# Patient Record
Sex: Male | Born: 1980 | Race: White | Hispanic: No | Marital: Married | Smoking: Current some day smoker
Health system: Southern US, Community
[De-identification: ages and names within clinical notes are randomized; demographics above are authoritative.]

## PROBLEM LIST (undated history)

## (undated) DIAGNOSIS — M549 Dorsalgia, unspecified: Secondary | ICD-10-CM

## (undated) DIAGNOSIS — B029 Zoster without complications: Secondary | ICD-10-CM

## (undated) DIAGNOSIS — F419 Anxiety disorder, unspecified: Secondary | ICD-10-CM

## (undated) DIAGNOSIS — M1990 Unspecified osteoarthritis, unspecified site: Secondary | ICD-10-CM

## (undated) DIAGNOSIS — I739 Peripheral vascular disease, unspecified: Secondary | ICD-10-CM

## (undated) HISTORY — PX: WISDOM TOOTH EXTRACTION: SHX21

## (undated) HISTORY — PX: TOOTH EXTRACTION: SUR596

---

## 2012-11-10 ENCOUNTER — Emergency Department (HOSPITAL_BASED_OUTPATIENT_CLINIC_OR_DEPARTMENT_OTHER): Payer: Managed Care, Other (non HMO)

## 2012-11-10 ENCOUNTER — Emergency Department (HOSPITAL_BASED_OUTPATIENT_CLINIC_OR_DEPARTMENT_OTHER)
Admission: EM | Admit: 2012-11-10 | Discharge: 2012-11-10 | Disposition: A | Discharge status: Home / Self Care | Payer: Managed Care, Other (non HMO) | Attending: Emergency Medicine | Admitting: Emergency Medicine

## 2012-11-10 ENCOUNTER — Encounter (HOSPITAL_BASED_OUTPATIENT_CLINIC_OR_DEPARTMENT_OTHER): Payer: Self-pay | Admitting: *Deleted

## 2012-11-10 DIAGNOSIS — Z79899 Other long term (current) drug therapy: Secondary | ICD-10-CM | POA: Insufficient documentation

## 2012-11-10 DIAGNOSIS — F17200 Nicotine dependence, unspecified, uncomplicated: Secondary | ICD-10-CM | POA: Insufficient documentation

## 2012-11-10 DIAGNOSIS — M5126 Other intervertebral disc displacement, lumbar region: Secondary | ICD-10-CM | POA: Insufficient documentation

## 2012-11-10 DIAGNOSIS — Z8739 Personal history of other diseases of the musculoskeletal system and connective tissue: Secondary | ICD-10-CM | POA: Insufficient documentation

## 2012-11-10 HISTORY — DX: Dorsalgia, unspecified: M54.9

## 2012-11-10 HISTORY — DX: Unspecified osteoarthritis, unspecified site: M19.90

## 2012-11-10 MED ORDER — DIAZEPAM 5 MG PO TABS
5.0000 mg | ORAL_TABLET | Freq: Once | ORAL | Status: AC
  Administered 2012-11-10: 5 mg via ORAL
  Filled 2012-11-10: qty 1

## 2012-11-10 MED ORDER — DEXAMETHASONE SODIUM PHOSPHATE 10 MG/ML IJ SOLN
10.0000 mg | Freq: Once | INTRAMUSCULAR | Status: AC
  Administered 2012-11-10: 10 mg via INTRAMUSCULAR
  Filled 2012-11-10: qty 1

## 2012-11-10 MED ORDER — FENTANYL CITRATE 0.05 MG/ML IJ SOLN
100.0000 ug | Freq: Once | INTRAMUSCULAR | Status: AC
  Administered 2012-11-10: 100 ug via INTRAMUSCULAR
  Filled 2012-11-10: qty 2

## 2012-11-10 MED ORDER — OXYCODONE-ACETAMINOPHEN 7.5-325 MG PO TABS: 1.0000 | ORAL_TABLET | ORAL | Status: DC | PRN

## 2012-11-10 MED ORDER — PREDNISONE 20 MG PO TABS: ORAL_TABLET | ORAL | Status: DC

## 2012-11-10 NOTE — ED Notes (Signed)
Back pain since November- dx with arthritis- has beentreated with hydrocodone, mobic and flexeril- c/o shooting pain down right leg with walking

## 2012-11-10 NOTE — ED Provider Notes (Signed)
History     CSN: [id number]  Arrival date & time 11/10/12  0920   First MD Initiated Contact with Patient 11/10/12 [phone]      Chief Complaint  Patient presents with  . Back Pain    (Consider location/radiation/quality/duration/timing/severity/associated sxs/prior treatment) HPI Comments: Patient presents with a three-month history of worsening pain to his back. He states it started in November. He denies any known injury. He was initially in his back going down his buttocks area but it's gotten markedly worse over the last 2-3 weeks without radiation down his right leg. His leg feels weak and time but is not sure if this is due to the pain. He denies any numbness in his legs. Denies any loss of bowel or bladder control. He states it's worse with any movement. He's been taking Flexeril and hydrocodone at home as well as Mobic with no improvement of symptoms.  Patient is a 32 y.o. male presenting with back pain.  Back Pain  Associated symptoms include weakness (right leg). Pertinent negatives include no chest pain, no fever, no numbness, no headaches and no abdominal pain.    Past Medical History  Diagnosis Date  . Back pain   . Arthritis     Past Surgical History  Procedure Date  . Tooth extraction     No family history on file.  History  Substance Use Topics  . Smoking status: Current Every Day Smoker -- 1.0 packs/day    Types: Cigarettes  . Smokeless tobacco: Never Used  . Alcohol Use: No      Review of Systems  Constitutional: Negative for fever, chills, diaphoresis and fatigue.  HENT: Negative for congestion, rhinorrhea and sneezing.   Eyes: Negative.   Respiratory: Negative for cough, chest tightness and shortness of breath.   Cardiovascular: Negative for chest pain and leg swelling.  Gastrointestinal: Negative for nausea, vomiting, abdominal pain, diarrhea and blood in stool.  Genitourinary: Negative for frequency, hematuria, flank pain and difficulty  urinating.  Musculoskeletal: Positive for back pain. Negative for arthralgias.  Skin: Negative for rash.  Neurological: Positive for weakness (right leg). Negative for dizziness, speech difficulty, numbness and headaches.    Allergies  Review of patient's allergies indicates no known allergies.  Home Medications   Current Outpatient Rx  Name  Route  Sig  Dispense  Refill  . CYCLOBENZAPRINE HCL 5 MG PO TABS   Oral   Take 5 mg by mouth 3 (three) times daily as needed.         Marland Kitchen HYDROCODONE-ACETAMINOPHEN 5-325 MG PO TABS   Oral   Take 1 tablet by mouth at bedtime as needed.         . IBUPROFEN 200 MG PO TABS   Oral   Take 800 mg by mouth every 8 (eight) hours as needed.         . MELOXICAM 15 MG PO TABS   Oral   Take 15 mg by mouth daily.         . OXYCODONE-ACETAMINOPHEN 7.5-325 MG PO TABS   Oral   Take 1 tablet by mouth every 4 (four) hours as needed for pain.   30 tablet   0   . PREDNISONE 20 MG PO TABS      3 tabs po day one, then 2 po daily x 4 days   11 tablet   0     BP 142/88  Pulse 67  Temp 97.4 F (36.3 C) (Oral)  Resp 18  Ht  6' (1.829 m)  Wt 180 lb (81.647 kg)  BMI 24.41 kg/m2  SpO2 100%  Physical Exam  Constitutional: He is oriented to person, place, and time. He appears well-developed and well-nourished.  HENT:  Head: Normocephalic and atraumatic.  Eyes: Pupils are equal, round, and reactive to light.  Neck: Normal range of motion. Neck supple.  Cardiovascular: Normal rate, regular rhythm and normal heart sounds.   Pulmonary/Chest: Effort normal and breath sounds normal. No respiratory distress. He has no wheezes. He has no rales. He exhibits no tenderness.  Abdominal: Soft. Bowel sounds are normal. There is no tenderness. There is no rebound and no guarding.  Musculoskeletal: Normal range of motion. He exhibits no edema.       Positive tenderness to right lower paraspinal area and right sciatic nerve area. Positive straight leg raise  on the right than on the left. Patellar reflexes are intact. He has normal sensation and motor function in the feet. Pulses in the feet are intact.  Lymphadenopathy:    He has no cervical adenopathy.  Neurological: He is alert and oriented to person, place, and time.  Skin: Skin is warm and dry. No rash noted.  Psychiatric: He has a normal mood and affect.    ED Course  Procedures (including critical care time)  No results found for this or any previous visit. Mr Lumbar Spine Wo Contrast  11/10/2012  *RADIOLOGY REPORT*  Clinical Data: No acute low back pain radiating into the right leg with unsteady gait.  No previous relevant surgery or acute injury.  MRI LUMBAR SPINE WITHOUT CONTRAST  Technique:  Multiplanar and multiecho pulse sequences of the lumbar spine were obtained without intravenous contrast.  Comparison: None.  Findings: Five lumbar type vertebral bodies are assumed.  There is straightening of the usual lumbar lordosis with a mild convex left scoliosis.  There is 2 mm of retrolisthesis at L5-S1.  Endplate degenerative changes are present at the L4-L5 and L5-S1 levels. There is no evidence of acute fracture or pars defect.  The conus medullaris extends to the L1 level and appears normal. There are no paraspinal abnormalities.  There are no significant disc space findings from T12-L1 through L3- L4.  L4-L5:  There is annular disc bulging with mild facet and ligamentous hypertrophy.  The left lateral recess is mildly narrowed.  The foramina are patent bilaterally.  There is no right- sided nerve root encroachment.  L5-S1:  There is a moderate sized posterolateral disc extrusion on the right with mass effect on the thecal sac and posterior displacement of the right S1 nerve root. The foramina appear sufficiently patent.  There is mild facet and ligamentous hypertrophy.  IMPRESSION:  1.  Moderate sized posterolateral disc extrusion on the right at L5- S1 with resulting posterior displacement of  the right S1 nerve root.  This likely accounts for the patient's acute symptoms. 2.  Underlying disc bulging at both L4-L5 and L5-S1 with endplate degeneration.  No significant foraminal compromise. 3.  No other significant disc space findings.   Original Report Authenticated By: Carey Bullocks, M.D.      1. HNP (herniated nucleus pulposus), lumbar       MDM  Patient with a herniated disc. He has no neurologic deficits. He was given some pain medicine and a shot of Decadron here in emergency department. He will followup with his physician at the [facility] in [place]. I did give him a prescription for a five-day course of prednisone as  well as Percocet 7.5 mg tablets. I advised him to stop his Vicodin.        Rolan Bucco, MD 11/10/12 1134

## 2012-11-10 NOTE — ED Notes (Signed)
Patient transported to MRI 

## 2012-12-23 ENCOUNTER — Encounter (HOSPITAL_COMMUNITY): Payer: Self-pay

## 2012-12-24 ENCOUNTER — Other Ambulatory Visit: Payer: Self-pay | Admitting: Orthopedic Surgery

## 2012-12-25 ENCOUNTER — Other Ambulatory Visit (HOSPITAL_COMMUNITY): Payer: Managed Care, Other (non HMO)

## 2012-12-25 NOTE — Pre-Procedure Instructions (Signed)
Harold [name]  12/25/2012   Your procedure is scheduled on:  Tuesday December 29, 2012  Report to [facility] at 1000 AM.  Call this number if you have problems the morning of surgery: [phone]   Remember:   Do not eat food or drink liquids after midnight.   Take these medicines the morning of surgery with A SIP OF WATER: Gabapentin and pain med if needed   Do not wear jewelry.  Do not wear lotions, or powders. You may wear deodorant.             Men may shave face and neck.  Do not bring valuables to the hospital.  Contacts, dentures or bridgework may not be worn into surgery.  Leave suitcase in the car. After surgery it may be brought to your room.  For patients admitted to the hospital, checkout time is 11:00 AM the day of  discharge.   Patients discharged the day of surgery will not be allowed to drive  home.    Special Instructions: Incentive Spirometry - Practice and bring it with you on the day of surgery. Shower using CHG 2 nights before surgery and the night before surgery.  If you shower the day of surgery use CHG.  Use special wash - you have one bottle of CHG for all showers.  You should use approximately 1/3 of the bottle for each shower.   Please read over the following fact sheets that you were given: Pain Booklet, Coughing and Deep Breathing, Blood Transfusion Information, MRSA Information and Surgical Site Infection Prevention

## 2012-12-28 ENCOUNTER — Encounter (HOSPITAL_COMMUNITY)
Admission: RE | Admit: 2012-12-28 | Discharge: 2012-12-28 | Disposition: A | Discharge status: Home / Self Care | Payer: Managed Care, Other (non HMO) | Source: Ambulatory Visit | Attending: Orthopedic Surgery | Admitting: Orthopedic Surgery

## 2012-12-28 ENCOUNTER — Encounter (HOSPITAL_COMMUNITY): Payer: Self-pay

## 2012-12-28 HISTORY — DX: Zoster without complications: B02.9

## 2012-12-28 LAB — URINALYSIS, ROUTINE W REFLEX MICROSCOPIC
Glucose, UA: NEGATIVE mg/dL
Hgb urine dipstick: NEGATIVE
Protein, ur: NEGATIVE mg/dL
pH: 8 (ref 5.0–8.0)

## 2012-12-28 LAB — COMPREHENSIVE METABOLIC PANEL
ALT: 13 U/L (ref 0–53)
CO2: 32 mEq/L (ref 19–32)
Calcium: 9.6 mg/dL (ref 8.4–10.5)
Creatinine, Ser: 1.22 mg/dL (ref 0.50–1.35)
GFR calc Af Amer: 89 mL/min — ABNORMAL LOW (ref >90)
GFR calc non Af Amer: 77 mL/min — ABNORMAL LOW (ref >90)
Glucose, Bld: 109 mg/dL — ABNORMAL HIGH (ref 70–99)
Sodium: 141 mEq/L (ref 135–145)
Total Bilirubin: 0.2 mg/dL — ABNORMAL LOW (ref 0.3–1.2)

## 2012-12-28 LAB — CBC
HCT: 43.3 % (ref 39.0–52.0)
Hemoglobin: 14.9 g/dL (ref 13.0–17.0)
MCV: 91.5 fL (ref 78.0–100.0)
RBC: 4.73 MIL/uL (ref 4.22–5.81)
RDW: 13.1 % (ref 11.5–15.5)
WBC: 14.5 10*3/uL — ABNORMAL HIGH (ref 4.0–10.5)

## 2012-12-28 LAB — TYPE AND SCREEN: ABO/RH(D): A POS

## 2012-12-28 MED ORDER — DEXTROSE 5 % IV SOLN: 3.0000 g | INTRAVENOUS | Status: DC

## 2012-12-28 MED ORDER — DEXTROSE 5 % IV SOLN
3.0000 g | INTRAVENOUS | Status: AC
  Administered 2012-12-29: 2 g via INTRAVENOUS

## 2012-12-29 ENCOUNTER — Inpatient Hospital Stay (HOSPITAL_COMMUNITY): Payer: Managed Care, Other (non HMO)

## 2012-12-29 ENCOUNTER — Observation Stay (HOSPITAL_COMMUNITY)
Admission: AD | Admit: 2012-12-29 | Discharge: 2012-12-30 | Disposition: A | Discharge status: Home / Self Care | Payer: Managed Care, Other (non HMO) | Source: Ambulatory Visit | Attending: Orthopedic Surgery | Admitting: Orthopedic Surgery

## 2012-12-29 ENCOUNTER — Inpatient Hospital Stay (HOSPITAL_COMMUNITY): Payer: Managed Care, Other (non HMO) | Admitting: Anesthesiology

## 2012-12-29 ENCOUNTER — Encounter (HOSPITAL_COMMUNITY)
Admission: AD | Disposition: A | Discharge status: Home / Self Care | Payer: Self-pay | Source: Ambulatory Visit | Attending: Orthopedic Surgery

## 2012-12-29 ENCOUNTER — Encounter (HOSPITAL_COMMUNITY): Payer: Self-pay | Admitting: Anesthesiology

## 2012-12-29 ENCOUNTER — Encounter (HOSPITAL_COMMUNITY): Payer: Self-pay

## 2012-12-29 DIAGNOSIS — M5126 Other intervertebral disc displacement, lumbar region: Principal | ICD-10-CM | POA: Insufficient documentation

## 2012-12-29 DIAGNOSIS — M5430 Sciatica, unspecified side: Secondary | ICD-10-CM | POA: Insufficient documentation

## 2012-12-29 DIAGNOSIS — Z01812 Encounter for preprocedural laboratory examination: Secondary | ICD-10-CM | POA: Insufficient documentation

## 2012-12-29 DIAGNOSIS — M5431 Sciatica, right side: Secondary | ICD-10-CM

## 2012-12-29 DIAGNOSIS — Z01818 Encounter for other preprocedural examination: Secondary | ICD-10-CM | POA: Insufficient documentation

## 2012-12-29 DIAGNOSIS — F17200 Nicotine dependence, unspecified, uncomplicated: Secondary | ICD-10-CM | POA: Insufficient documentation

## 2012-12-29 HISTORY — PX: LUMBAR LAMINECTOMY/DECOMPRESSION MICRODISCECTOMY: SHX5026

## 2012-12-29 SURGERY — LUMBAR LAMINECTOMY/DECOMPRESSION MICRODISCECTOMY 1 LEVEL
Anesthesia: General | Site: Back | Laterality: Right | Wound class: Clean

## 2012-12-29 MED ORDER — OXYCODONE HCL 5 MG PO TABS: 5.0000 mg | ORAL_TABLET | Freq: Once | ORAL | Status: DC | PRN

## 2012-12-29 MED ORDER — SODIUM CHLORIDE 0.9 % IJ SOLN
3.0000 mL | Freq: Two times a day (BID) | INTRAMUSCULAR | Status: DC
  Administered 2012-12-29: 3 mL via INTRAVENOUS

## 2012-12-29 MED ORDER — ROCURONIUM BROMIDE 100 MG/10ML IV SOLN
INTRAVENOUS | Status: DC | PRN
  Administered 2012-12-29: 50 mg via INTRAVENOUS

## 2012-12-29 MED ORDER — HYDROMORPHONE HCL PF 1 MG/ML IJ SOLN
0.2500 mg | INTRAMUSCULAR | Status: DC | PRN
  Administered 2012-12-29 (×2): 0.25 mg via INTRAVENOUS
  Administered 2012-12-29: 0.5 mg via INTRAVENOUS

## 2012-12-29 MED ORDER — ACETAMINOPHEN 650 MG RE SUPP: 650.0000 mg | RECTAL | Status: DC | PRN

## 2012-12-29 MED ORDER — SODIUM CHLORIDE 0.9 % IJ SOLN: 3.0000 mL | INTRAMUSCULAR | Status: DC | PRN

## 2012-12-29 MED ORDER — GLYCOPYRROLATE 0.2 MG/ML IJ SOLN
INTRAMUSCULAR | Status: DC | PRN
  Administered 2012-12-29: 0.4 mg via INTRAVENOUS

## 2012-12-29 MED ORDER — PROPOFOL 10 MG/ML IV BOLUS
INTRAVENOUS | Status: DC | PRN
  Administered 2012-12-29: 200 mg via INTRAVENOUS

## 2012-12-29 MED ORDER — DOCUSATE SODIUM 100 MG PO CAPS
100.0000 mg | ORAL_CAPSULE | Freq: Two times a day (BID) | ORAL | Status: DC
  Administered 2012-12-29 – 2012-12-30 (×2): 100 mg via ORAL
  Filled 2012-12-29 (×2): qty 1

## 2012-12-29 MED ORDER — LACTATED RINGERS IV SOLN
INTRAVENOUS | Status: DC | PRN
  Administered 2012-12-29 (×2): via INTRAVENOUS

## 2012-12-29 MED ORDER — SENNOSIDES-DOCUSATE SODIUM 8.6-50 MG PO TABS: 1.0000 | ORAL_TABLET | Freq: Every evening | ORAL | Status: DC | PRN

## 2012-12-29 MED ORDER — THROMBIN 20000 UNITS EX SOLR
OROMUCOSAL | Status: DC | PRN
  Administered 2012-12-29: 13:00:00 via TOPICAL

## 2012-12-29 MED ORDER — LIDOCAINE-EPINEPHRINE (PF) 1 %-1:200000 IJ SOLN
INTRAMUSCULAR | Status: DC | PRN
  Administered 2012-12-29: 4.5 mL

## 2012-12-29 MED ORDER — LIDOCAINE-EPINEPHRINE (PF) 1 %-1:200000 IJ SOLN
INTRAMUSCULAR | Status: AC
  Filled 2012-12-29: qty 10

## 2012-12-29 MED ORDER — LIDOCAINE HCL (CARDIAC) 20 MG/ML IV SOLN
INTRAVENOUS | Status: DC | PRN
  Administered 2012-12-29: 70 mg via INTRAVENOUS

## 2012-12-29 MED ORDER — HYDROMORPHONE HCL PF 1 MG/ML IJ SOLN
INTRAMUSCULAR | Status: AC
  Filled 2012-12-29: qty 1

## 2012-12-29 MED ORDER — THROMBIN 20000 UNITS EX SOLR
CUTANEOUS | Status: AC
  Filled 2012-12-29: qty 20000

## 2012-12-29 MED ORDER — BACITRACIN-NEOMYCIN-POLYMYXIN 400-5-5000 EX OINT
TOPICAL_OINTMENT | CUTANEOUS | Status: AC
  Filled 2012-12-29: qty 1

## 2012-12-29 MED ORDER — ACETAMINOPHEN 325 MG PO TABS: 650.0000 mg | ORAL_TABLET | ORAL | Status: DC | PRN

## 2012-12-29 MED ORDER — ZOLPIDEM TARTRATE 5 MG PO TABS: 5.0000 mg | ORAL_TABLET | Freq: Every evening | ORAL | Status: DC | PRN

## 2012-12-29 MED ORDER — SODIUM CHLORIDE 0.9 % IV SOLN: 250.0000 mL | INTRAVENOUS | Status: DC

## 2012-12-29 MED ORDER — HYDROCODONE-ACETAMINOPHEN 10-325 MG PO TABS
1.0000 | ORAL_TABLET | ORAL | Status: DC | PRN
  Administered 2012-12-29 – 2012-12-30 (×3): 2 via ORAL
  Filled 2012-12-29 (×4): qty 2

## 2012-12-29 MED ORDER — METHOCARBAMOL 500 MG PO TABS
500.0000 mg | ORAL_TABLET | Freq: Four times a day (QID) | ORAL | Status: DC | PRN
  Administered 2012-12-29: 500 mg via ORAL
  Filled 2012-12-29: qty 1

## 2012-12-29 MED ORDER — KETOROLAC TROMETHAMINE 30 MG/ML IJ SOLN
INTRAMUSCULAR | Status: AC
  Administered 2012-12-29: 30 mg
  Filled 2012-12-29: qty 1

## 2012-12-29 MED ORDER — PANTOPRAZOLE SODIUM 40 MG IV SOLR
40.0000 mg | Freq: Every day | INTRAVENOUS | Status: DC
  Administered 2012-12-29: 40 mg via INTRAVENOUS
  Filled 2012-12-29 (×2): qty 40

## 2012-12-29 MED ORDER — PHENOL 1.4 % MT LIQD: 1.0000 | OROMUCOSAL | Status: DC | PRN

## 2012-12-29 MED ORDER — ONDANSETRON HCL 4 MG/2ML IJ SOLN: 4.0000 mg | INTRAMUSCULAR | Status: DC | PRN

## 2012-12-29 MED ORDER — ARTIFICIAL TEARS OP OINT
TOPICAL_OINTMENT | OPHTHALMIC | Status: DC | PRN
  Administered 2012-12-29: 1 via OPHTHALMIC

## 2012-12-29 MED ORDER — MIDAZOLAM HCL 5 MG/5ML IJ SOLN
INTRAMUSCULAR | Status: DC | PRN
  Administered 2012-12-29 (×2): 1 mg via INTRAVENOUS

## 2012-12-29 MED ORDER — KETOROLAC TROMETHAMINE 30 MG/ML IJ SOLN
30.0000 mg | Freq: Four times a day (QID) | INTRAMUSCULAR | Status: DC
  Administered 2012-12-29 – 2012-12-30 (×4): 30 mg via INTRAVENOUS
  Filled 2012-12-29 (×5): qty 1

## 2012-12-29 MED ORDER — ONDANSETRON HCL 4 MG/2ML IJ SOLN
INTRAMUSCULAR | Status: DC | PRN
  Administered 2012-12-29: 4 mg via INTRAVENOUS

## 2012-12-29 MED ORDER — HYDROMORPHONE HCL PF 1 MG/ML IJ SOLN
0.5000 mg | INTRAMUSCULAR | Status: DC | PRN
  Administered 2012-12-29 – 2012-12-30 (×4): 1 mg via INTRAVENOUS
  Filled 2012-12-29 (×4): qty 1

## 2012-12-29 MED ORDER — MENTHOL 3 MG MT LOZG: 1.0000 | LOZENGE | OROMUCOSAL | Status: DC | PRN

## 2012-12-29 MED ORDER — METHOCARBAMOL 100 MG/ML IJ SOLN
500.0000 mg | Freq: Four times a day (QID) | INTRAVENOUS | Status: DC | PRN
  Filled 2012-12-29: qty 5

## 2012-12-29 MED ORDER — POTASSIUM CHLORIDE IN NACL 20-0.45 MEQ/L-% IV SOLN
INTRAVENOUS | Status: DC
  Administered 2012-12-29: 16:00:00 via INTRAVENOUS
  Filled 2012-12-29 (×3): qty 1000

## 2012-12-29 MED ORDER — BUPIVACAINE HCL (PF) 0.25 % IJ SOLN
INTRAMUSCULAR | Status: DC | PRN
  Administered 2012-12-29: 4.5 mL

## 2012-12-29 MED ORDER — BISACODYL 10 MG RE SUPP: 10.0000 mg | Freq: Every day | RECTAL | Status: DC | PRN

## 2012-12-29 MED ORDER — CEFAZOLIN SODIUM-DEXTROSE 2-3 GM-% IV SOLR
INTRAVENOUS | Status: AC
  Filled 2012-12-29: qty 50

## 2012-12-29 MED ORDER — NEOSTIGMINE METHYLSULFATE 1 MG/ML IJ SOLN
INTRAMUSCULAR | Status: DC | PRN
  Administered 2012-12-29: 3 mg via INTRAVENOUS

## 2012-12-29 MED ORDER — CHLORHEXIDINE GLUCONATE 4 % EX LIQD: 60.0000 mL | Freq: Once | CUTANEOUS | Status: DC

## 2012-12-29 MED ORDER — SODIUM CHLORIDE 0.9 % IR SOLN
Status: DC | PRN
  Administered 2012-12-29: 13:00:00

## 2012-12-29 MED ORDER — ONDANSETRON HCL 4 MG/2ML IJ SOLN: 4.0000 mg | Freq: Four times a day (QID) | INTRAMUSCULAR | Status: DC | PRN

## 2012-12-29 MED ORDER — LIDOCAINE HCL 4 % MT SOLN
OROMUCOSAL | Status: DC | PRN
  Administered 2012-12-29: 4 mL via TOPICAL

## 2012-12-29 MED ORDER — OXYCODONE HCL 5 MG/5ML PO SOLN: 5.0000 mg | Freq: Once | ORAL | Status: DC | PRN

## 2012-12-29 MED ORDER — BUPIVACAINE HCL (PF) 0.25 % IJ SOLN
INTRAMUSCULAR | Status: AC
  Filled 2012-12-29: qty 30

## 2012-12-29 MED ORDER — FENTANYL CITRATE 0.05 MG/ML IJ SOLN
INTRAMUSCULAR | Status: DC | PRN
  Administered 2012-12-29 (×6): 50 ug via INTRAVENOUS
  Administered 2012-12-29: 100 ug via INTRAVENOUS
  Administered 2012-12-29 (×2): 50 ug via INTRAVENOUS

## 2012-12-29 SURGICAL SUPPLY — 75 items
BAG DECANTER FOR FLEXI CONT (MISCELLANEOUS) ×2 IMPLANT
BENZOIN TINCTURE PRP APPL 2/3 (GAUZE/BANDAGES/DRESSINGS) ×4 IMPLANT
BLADE SURG ROTATE 9660 (MISCELLANEOUS) ×2 IMPLANT
BUR MATCHSTICK NEURO 3.0 LAGG (BURR) ×2 IMPLANT
CARTRIDGE OIL MAESTRO DRILL (MISCELLANEOUS) ×1 IMPLANT
CLOTH BEACON ORANGE TIMEOUT ST (SAFETY) ×2 IMPLANT
CLSR STERI-STRIP ANTIMIC 1/2X4 (GAUZE/BANDAGES/DRESSINGS) ×2 IMPLANT
CORDS BIPOLAR (ELECTRODE) ×2 IMPLANT
COVER SURGICAL LIGHT HANDLE (MISCELLANEOUS) ×2 IMPLANT
DIFFUSER DRILL AIR PNEUMATIC (MISCELLANEOUS) ×2 IMPLANT
DRAIN CHANNEL 15F RND FF W/TCR (WOUND CARE) ×2 IMPLANT
DRAPE PROXIMA HALF (DRAPES) ×4 IMPLANT
DRAPE SURG 17X23 STRL (DRAPES) ×8 IMPLANT
DRAPE TABLE COVER HEAVY DUTY (DRAPES) ×2 IMPLANT
DRSG MEPILEX BORDER 4X8 (GAUZE/BANDAGES/DRESSINGS) ×2 IMPLANT
DRSG OPSITE 11X17.75 LRG (GAUZE/BANDAGES/DRESSINGS) ×2 IMPLANT
DRSG PAD ABDOMINAL 8X10 ST (GAUZE/BANDAGES/DRESSINGS) ×2 IMPLANT
DURAPREP 26ML APPLICATOR (WOUND CARE) ×2 IMPLANT
ELECT BLADE 4.0 EZ CLEAN MEGAD (MISCELLANEOUS) ×2
ELECT CAUTERY BLADE 6.4 (BLADE) ×2 IMPLANT
ELECT REM PT RETURN 9FT ADLT (ELECTROSURGICAL) ×2
ELECTRODE BLDE 4.0 EZ CLN MEGD (MISCELLANEOUS) ×1 IMPLANT
ELECTRODE REM PT RTRN 9FT ADLT (ELECTROSURGICAL) ×1 IMPLANT
EVACUATOR 1/8 PVC DRAIN (DRAIN) IMPLANT
EVACUATOR SILICONE 100CC (DRAIN) ×2 IMPLANT
FILTER STRAW FLUID ASPIR (MISCELLANEOUS) IMPLANT
GAUZE SPONGE 4X4 16PLY XRAY LF (GAUZE/BANDAGES/DRESSINGS) ×2 IMPLANT
GLOVE BIOGEL PI IND STRL 7.5 (GLOVE) ×1 IMPLANT
GLOVE BIOGEL PI IND STRL 9 (GLOVE) ×1 IMPLANT
GLOVE BIOGEL PI INDICATOR 7.5 (GLOVE) ×1
GLOVE BIOGEL PI INDICATOR 9 (GLOVE) ×1
GLOVE SS BIOGEL STRL SZ 7 (GLOVE) ×1 IMPLANT
GLOVE SS BIOGEL STRL SZ 8.5 (GLOVE) ×1 IMPLANT
GLOVE SUPERSENSE BIOGEL SZ 7 (GLOVE) ×1
GLOVE SUPERSENSE BIOGEL SZ 8.5 (GLOVE) ×1
GOWN PREVENTION PLUS XLARGE (GOWN DISPOSABLE) ×2 IMPLANT
GOWN STRL NON-REIN LRG LVL3 (GOWN DISPOSABLE) ×4 IMPLANT
IV CATH 14GX2 1/4 (CATHETERS) IMPLANT
KIT BASIN OR (CUSTOM PROCEDURE TRAY) ×2 IMPLANT
KIT POSITION SURG JACKSON T1 (MISCELLANEOUS) ×2 IMPLANT
KIT ROOM TURNOVER OR (KITS) ×2 IMPLANT
NEEDLE 27GAX1X1/2 (NEEDLE) IMPLANT
NEEDLE SPNL 22GX3.5 QUINCKE BK (NEEDLE) ×2 IMPLANT
NS IRRIG 1000ML POUR BTL (IV SOLUTION) ×2 IMPLANT
OIL CARTRIDGE MAESTRO DRILL (MISCELLANEOUS) ×2
PACK LAMINECTOMY ORTHO (CUSTOM PROCEDURE TRAY) ×2 IMPLANT
PACK UNIVERSAL I (CUSTOM PROCEDURE TRAY) ×2 IMPLANT
PAD ARMBOARD 7.5X6 YLW CONV (MISCELLANEOUS) ×4 IMPLANT
PATTIES SURGICAL .5 X1 (DISPOSABLE) ×2 IMPLANT
PATTIES SURGICAL .75X.75 (GAUZE/BANDAGES/DRESSINGS) IMPLANT
PATTIES SURGICAL 1X1 (DISPOSABLE) IMPLANT
SPONGE GAUZE 4X4 12PLY (GAUZE/BANDAGES/DRESSINGS) ×2 IMPLANT
SPONGE NEURO XRAY DETECT 1X3 (DISPOSABLE) IMPLANT
SPONGE SURGIFOAM ABS GEL 100 (HEMOSTASIS) ×2 IMPLANT
STRIP CLOSURE SKIN 1/2X4 (GAUZE/BANDAGES/DRESSINGS) ×4 IMPLANT
SURGIFLO TRUKIT (HEMOSTASIS) ×4 IMPLANT
SUT ETHILON 2 0 FS 18 (SUTURE) ×4 IMPLANT
SUT VIC AB 1 CT1 18XCR BRD 8 (SUTURE) ×2 IMPLANT
SUT VIC AB 1 CT1 8-18 (SUTURE) ×2
SUT VIC AB 1 CTX 18 (SUTURE) ×2 IMPLANT
SUT VIC AB 1 CTX 36 (SUTURE) ×5
SUT VIC AB 1 CTX36XBRD ANBCTR (SUTURE) ×5 IMPLANT
SUT VIC AB 2-0 CT1 18 (SUTURE) ×2 IMPLANT
SUT VIC AB 2-0 CT1 27 (SUTURE) ×2
SUT VIC AB 2-0 CT1 TAPERPNT 27 (SUTURE) ×2 IMPLANT
SUT VIC AB 3-0 X1 27 (SUTURE) ×4 IMPLANT
SYR 3ML LL SCALE MARK (SYRINGE) IMPLANT
SYR BULB IRRIGATION 50ML (SYRINGE) ×2 IMPLANT
SYR CONTROL 10ML LL (SYRINGE) ×4 IMPLANT
SYRINGE 10CC LL (SYRINGE) ×2 IMPLANT
TOWEL OR 17X24 6PK STRL BLUE (TOWEL DISPOSABLE) ×2 IMPLANT
TOWEL OR 17X26 10 PK STRL BLUE (TOWEL DISPOSABLE) ×2 IMPLANT
TRAY FOLEY CATH 14FR (SET/KITS/TRAYS/PACK) ×2 IMPLANT
TUBE SUCT ARGYLE STRL (TUBING) ×2 IMPLANT
WATER STERILE IRR 1000ML POUR (IV SOLUTION) ×8 IMPLANT

## 2012-12-29 NOTE — Interval H&P Note (Signed)
History and Physical Interval Note:  12/29/2012 11:53 AM  Harold [name]  has presented today for surgery, with the diagnosis of RL5-S1 DISC HERNIATION  The various methods of treatment have been discussed with the patient and family. After consideration of risks, benefits and other options for treatment, the patient has consented to  Procedure(s) with comments: SPINE: LAMINOTOMY DECOMPRESSION NERVE ROOT EXCISION HERNIATED DISK 1 SPACE LUMBAR (Right) - RIGHT L5-S1 DISC EXCISION as a surgical intervention .  The patient's history has been reviewed, patient examined, no change in status, stable for surgery.  I have reviewed the patient's chart and labs.  Questions were answered to the patient's satisfaction.     TOOKE, MICHAEL

## 2012-12-29 NOTE — Transfer of Care (Signed)
Immediate Anesthesia Transfer of Care Note  Patient: Harold [name]  Procedure(s) Performed: Procedure(s) with comments: SPINE: LAMINOTOMY DECOMPRESSION NERVE ROOT EXCISION HERNIATED DISK 1 SPACE LUMBAR (Right) - RIGHT L5-S1 DISC EXCISION  Patient Location: PACU  Anesthesia Type:General  Level of Consciousness: awake, alert  and oriented  Airway & Oxygen Therapy: Patient Spontanous Breathing and Patient connected to nasal cannula oxygen  Post-op Assessment: Report given to PACU RN, Post -op Vital signs reviewed and stable and Patient moving all extremities X 4  Post vital signs: Reviewed and stable  Complications: No apparent anesthesia complications

## 2012-12-29 NOTE — Op Note (Signed)
Dictation [phone]

## 2012-12-29 NOTE — H&P (View-Only) (Signed)
Pre-op phase/ questions comopleted  by Nelly Laurence and not Tod Persia.

## 2012-12-29 NOTE — Preoperative (Signed)
Beta Blockers   Reason not to administer Beta Blockers: not prescribed 

## 2012-12-29 NOTE — Anesthesia Postprocedure Evaluation (Signed)
Anesthesia Post Note  Patient: Harold [name]  Procedure(s) Performed: Procedure(s) (LRB): SPINE: LAMINOTOMY DECOMPRESSION NERVE ROOT EXCISION HERNIATED DISK 1 SPACE LUMBAR (Right)  Anesthesia type: general  Patient location: PACU  Post pain: Pain level controlled  Post assessment: Patient's Cardiovascular Status Stable  Last Vitals:  Filed Vitals:   12/29/12 1530  BP: 97/64  Pulse: 52  Temp:   Resp: 11    Post vital signs: Reviewed and stable  Level of consciousness: sedated  Complications: No apparent anesthesia complications

## 2012-12-29 NOTE — Anesthesia Preprocedure Evaluation (Signed)
Anesthesia Evaluation  Patient identified by MRN, date of birth, ID band Patient awake    Reviewed: Allergy & Precautions, H&P , NPO status , Patient's Chart, lab work & pertinent test results  Airway Mallampati: II  Neck ROM: full    Dental   Pulmonary Current Smoker,          Cardiovascular     Neuro/Psych    GI/Hepatic   Endo/Other    Renal/GU      Musculoskeletal   Abdominal   Peds  Hematology   Anesthesia Other Findings   Reproductive/Obstetrics                           Anesthesia Physical Anesthesia Plan  ASA: II  Anesthesia Plan: General   Post-op Pain Management:    Induction: Intravenous  Airway Management Planned: Oral ETT  Additional Equipment:   Intra-op Plan:   Post-operative Plan: Extubation in OR  Informed Consent: I have reviewed the patients History and Physical, chart, labs and discussed the procedure including the risks, benefits and alternatives for the proposed anesthesia with the patient or authorized representative who has indicated his/her understanding and acceptance.     Plan Discussed with: CRNA and Surgeon  Anesthesia Plan Comments:         Anesthesia Quick Evaluation  

## 2012-12-29 NOTE — Brief Op Note (Signed)
Op'n: Right L5-S1 disc protrusion  Surgeon: Judie Petit tooke  Ass't: Patrick Jupiter RNFA  NO com0plications  EBL<100 CC's

## 2012-12-29 NOTE — Progress Notes (Signed)
Pre-op phase/ questions comopleted  by Michelle Savage,RN and not Angela Daye,RN. 

## 2012-12-29 NOTE — Anesthesia Procedure Notes (Signed)
Procedure Name: Intubation Date/Time: 12/29/2012 11:52 AM Performed by: Gayla Medicus Pre-anesthesia Checklist: Emergency Drugs available, Suction available, Patient being monitored, Patient identified and Timeout performed Patient Re-evaluated:Patient Re-evaluated prior to inductionOxygen Delivery Method: Circle system utilized Preoxygenation: Pre-oxygenation with 100% oxygen Intubation Type: IV induction Ventilation: Mask ventilation without difficulty Laryngoscope Size: Mac and 4 Grade View: Grade I Tube type: Oral Tube size: 7.5 mm Number of attempts: 1 Airway Equipment and Method: Stylet and LTA kit utilized Placement Confirmation: ETT inserted through vocal cords under direct vision,  positive ETCO2 and breath sounds checked- equal and bilateral Secured at: 22 cm Tube secured with: Tape Dental Injury: Teeth and Oropharynx as per pre-operative assessment

## 2012-12-29 NOTE — H&P (Signed)
[facility] [address]   [address] [place], [place] [address] [phone] A Division of [facility]  Loreta Ave, M.D.   Robert A. Thurston Hole, M.D.   Burnell Blanks, M.D.   Eulas Post, M.D.   Lunette Stands, M.D Buford Dresser, M.D.  Charlsie Quest, M.D.   Estell Harpin, M.D.   Melina Fiddler, M.D. Genene Churn. Barry Dienes, PA-C            Kirstin A. Shepperson, PA-C Josh Roseland, PA-C [place], [place]  RE: Dyland, Harold [name]   [id number]      DOB: June 15, 1981 PROGRESS NOTE: 12-23-12 Page 2  He would not be back to his job which requires mostly walking and standing, insignificant lifting, and a very small amount of bending and stooping, until around 6-8 weeks postoperatively. I don't think he would get back before that. I have explained to him that the leg pain should be relieved very dramatically following the surgery, but that if he does too much in the early going, he will experience more leg pain and if nothing else scare himself. By the time he gets to the 3 month postop mark he should have achieved about 80-90% of whatever improvement he is going to get, and the rest of the improvement takes place over the course of the ensuing year. General complications were discussed as follows:  death, heart attack, stroke, phlebitis in leg or pelvic veins, pulmonary embolus, pneumonia, infection, and excessive hemorrhage requiring transfusion. Specific complications pertaining to this surgery include:  nerve damage with increased pain, loss of sensation, leg weakness, spinal fluid leak with the need for recumbency postoperatively, epidural hematoma with the need for return to the operating room for reexploration, disc herniation recurrence (5% incidence) with the need for reoperation for recurrent symptoms, need for subsequent fusion surgery at some point in the future should he develop severe back pain secondary to disc degeneration.  This note will serve as an admission to the hospital history and physical so the following are included: Social History:   He is engaged. He has no children. He smokes 20 cigarettes per day. He drinks alcohol rarely. He has worked at [employer] for 4 months.   PHYSICAL EXAM:  Head and neck:  negative. Cardiovascular:  regular rhythm, normal heart sounds, no peripheral edema. Respiratory:  clear to auscultation. Abdomen:  soft, nontender. Musculoskeletal/neurological:  limited lumbar flexion about 10 degrees. Straight leg raising including crossover straight leg raising; loss of right ankle jerk; wasting of the right gluteus maximus muscle; easy fatigability of the right gastrocnemius soleus muscle as evidenced by ability to only toe-up once independently.   IMAGING:  MRI shows extruded L5-S1 disc fragment right side, disc degeneration at L4-5 without any neurologic compression. Radiographs:  pending receipt of radiographs which his brother will pick up and provide for me the day of surgery. [facility] been done at [facility].   DIAGNOSIS:  L5-S1 disc herniation with right sciatic pain. Having discussed the matter fully with him and his brother, having gone through the informed consent process, we will arrange for his admission to [facility] in 6 days' time at which point we will perform the surgery. Today he is being provided with a prescription for Norco 10 one q. 4 hours p.r.n. for pain. He will discontinue the Neurontin for the time being.     Electronically verified by S. Nelda Severe, M.D. SMT:tal  D 12-24-12 T 12-24-12

## 2012-12-30 MED ORDER — PANTOPRAZOLE SODIUM 40 MG PO TBEC
40.0000 mg | DELAYED_RELEASE_TABLET | Freq: Every day | ORAL | Status: DC
  Administered 2012-12-30: 40 mg via ORAL
  Filled 2012-12-30: qty 1

## 2012-12-30 NOTE — Discharge Summary (Signed)
Date of admission 12/29/2012  Date of discharge 12/30/2012  Discharge diagnosis: right L5-S1 disc herniation with right sciatica  The day of admission, the patient was brought to the operating room where an uneventful right L5-S1 laminotomy and disc excision was carried out. He has experienced good relief of his right sciatic pain immediately postoperatively. Today in his room he is standing, closed, walking around, and wanting to go home. I have previously prescribed a supply of Norco plan for him to have postoperatively.  He may remove his dressing tomorrow and shower. He has a previously made postoperative appointment for 01/12/2013.  Condition on discharge: Improved right sciatic pain, ambulatory, wound stable, afebrile

## 2012-12-30 NOTE — Evaluation (Signed)
Occupational Therapy Evaluation Patient Details Name: Harold [name] MRN: [id number] DOB: 02/18/1981 Today's Date: 12/30/2012 Time: 7829-5621 OT Time Calculation (min): 10 min  OT Assessment / Plan / Recommendation Clinical Impression  Pt is recovering from lumbar laminectomy and decompression.  Pt reports he already feels better and his walking is greatly improved POD 1.  Instructed pt in back precautions related to ADL and IADL. He is performing at modified to independent level in mobility and ADL.  No DME or further OT needs.    OT Assessment  Patient does not need any further OT services    Follow Up Recommendations  No OT follow up    Barriers to Discharge      Equipment Recommendations  None recommended by OT    Recommendations for Other Services    Frequency       Precautions / Restrictions Precautions Precautions: Back Precaution Booklet Issued: Yes (comment) Precaution Comments: Instructed pt in back precautions and cautioned him to remember them because he is feeling good and does not have a brace as a reminder.   Pertinent Vitals/Pain No pain reported    ADL  Transfers/Ambulation Related to ADLs: Independent ADL Comments: Independent. Instructed in back precautions related to ADL and IADL.    OT Diagnosis:    OT Problem List:   OT Treatment Interventions:     OT Goals    Visit Information  Last OT Received On: 12/30/12    Subjective Data  Subjective: "I'm already walking better." Patient Stated Goal: Return to PLOF.   Prior Functioning     Home Living Lives With: Alone Available Help at Discharge: Friend(s) Type of Home: House Home Access: Stairs to enter Entrance Stairs-Rails: Can reach both Home Layout: One level Bathroom Shower/Tub: Engineer, manufacturing systems: Standard Home Adaptive Equipment: None Prior Function Level of Independence: Independent Able to Take Stairs?: Reciprically Driving: Yes Vocation: Full time  employment Communication Communication: No difficulties Dominant Hand: Right         Vision/Perception Vision - History Baseline Vision: Wears glasses all the time Patient Visual Report: No change from baseline   Cognition  Cognition Overall Cognitive Status: Appears within functional limits for tasks assessed/performed Arousal/Alertness: Awake/alert Orientation Level: Appears intact for tasks assessed Behavior During Session: [facility] for tasks performed    Extremity/Trunk Assessment Right Upper Extremity Assessment RUE ROM/Strength/Tone: WFL for tasks assessed RUE Coordination: WFL - gross/fine motor Left Upper Extremity Assessment LUE ROM/Strength/Tone: WFL for tasks assessed LUE Coordination: WFL - gross/fine motor Trunk Assessment Trunk Assessment: Normal     Mobility Bed Mobility Bed Mobility: Not assessed (instructed verball in log roll technique) Transfers Transfers: Sit to Stand;Stand to Sit Sit to Stand: 6: Modified independent (Device/Increase time) Stand to Sit: 6: Modified independent (Device/Increase time)     Exercise     Balance     End of Session OT - End of Session Activity Tolerance: Patient tolerated treatment well Patient left: with family/visitor present  GO Functional Assessment Tool Used: clilnical judgment Functional Limitation: Self care Self Care Current Status (H0865): 0 percent impaired, limited or restricted Self Care Goal Status (H8469): 0 percent impaired, limited or restricted Self Care Discharge Status (G2952): 0 percent impaired, limited or restricted   Evern Bio 12/30/2012, 1:45 PM

## 2012-12-30 NOTE — Clinical Social Work Note (Addendum)
Clinical Social Work   CSW received consult for SNF. CSW reviewed chart and discussed pt with RNCM. Awaiting PT/OT evals for discharge recommendations. Evals are needed for prior authorization for SNF placement. Please call with any urgent needs. CSW will continue to follow.   Dede Query, MSW, Theresia Majors [phone]  Addendum: 12/30/2012 at 1:22 PM Pt is ready for discharge and will return home. CSW will update RNCM. MD is aware. CSW is signing off as no further needs identified.  Dede Query, MSW, Theresia Majors [phone]

## 2012-12-30 NOTE — Op Note (Signed)
NAMEOMERO, KOWAL NO.:  [id number]  MEDICAL RECORD NO.:  [id number]  LOCATION:  4N12C                        FACILITY:  [facility]  PHYSICIAN:  Nelda Severe, MD      DATE OF BIRTH:  14-Apr-1981  DATE OF PROCEDURE: DATE OF DISCHARGE:                              OPERATIVE REPORT   SURGEON:  Nelda Severe, MD  ASSISTANT:  Patrick Jupiter, RNFA  PREOPERATIVE DIAGNOSIS:  Right L5-S1 disk protrusion with severe right sciatic pain unresponsive to nonsurgical measures.  POSTPROCEDURE DIAGNOSIS:  Right L5-S1 disk protrusion with severe right sciatic pain unresponsive to nonsurgical measures.  PROCEDURE:  Right L5-S1 laminotomy and disk excision.  OPERATIVE NOTE:  The patient was placed under general endotracheal anesthesia.  Sequential compression devices were placed on both lower extremities.  Intravenous antibiotics were administered for prophylaxis.  He was positioned prone on the [place] frame.  Upper extremities were positioned so as to avoid hyperflexion and abduction of the shoulders so as to avoid hyperflexion of the elbows.  He was padded with foam from axilla to hands bilaterally.  The thighs, knees, shins, and ankles supported on pillows bilaterally.  Hair was clipped from the lumbar area.  The lumbar area was prepped with DuraPrep and draped in rectangular fashion.  Drapes secured with Ioban.  At this point, a time-out was held during which the patient's identity, diagnosis, intended procedure, etc., were discussed/confirmed.  The midline area at the lumbosacral level was infiltrated with a mixture of 0.25% plain Marcaine and 1% lidocaine with epinephrine in the subdermal and subcutaneous layer.  Spinal needle was placed at what was thought to be the L5-S1 level a cross-table lateral radiograph taken, revealing that in fact at S1-2. Needle was shifted 1 interspace proximally and another radiograph taken showing the needle at L5-S1.  It should be  noted subsequently 2 more radiographs were taken, the last confirming that we had been at the correct level by a nerve hook being placed in the disk space.  The paraspinal muscle on the right side were dissected off the spinous process and lamina after making a skin incision and dissecting through a very thin subcutaneous layer.  Another cross-table lateral radiograph was taken with the Kocher on the L5 spinous process which was notched.  Soft tissue was cleared from the interlaminar space in the right side at L5-S1.  High-speed bur was used to perform approximately 20% medial inferior facetectomy and laminotomy at L5 on the right.  Curette was then used to detach the ligamentum flavum from the upper edge of S1.  A cottonoid patty was interposed between the dura and the lamina of S1 and a small amount of S1 lamina removed.  We then removed bone into the lateral recess at L5-S1 and the medial edge of the S1 superior articular facet.  The ligamentum flavum was then removed from the laminotomy.  The tissues in the epidural space were very edematous and injected. Ultimately after controlling a few epidural veins with bipolar coagulation, the dura and S1 nerve root were mobilized off of the disk protrusion.  Actually, subsequently a fragment of disk was found adherent to the anterior surface of the S1 nerve  root removed.  Other than that, no free fragments were found.  However, we then perforated the annulus a little lateral to where a midline perforation was identified with a small amount of disk exuding, and the disk enucleated in the usual fashion using curettes of various types and pituitary rongeurs.  Decompressive efforts were ceased when I was able to satisfy myself by sweeping __________ across the floor of the canal and within the disk space, there were no remaining loose disk fragments.  I did use an Epstein curettes to deliver some disk which was lateral to the annulotomy  back into the disk space and removed with pituitary rongeurs. The disk space was irrigated with antibiotic solution.  A cross-table lateral radiograph was taken with a nerve hook, and the disk space verifying/confirming that we were at the L5-S1 level.  The edges of the laminotomy had been smeared with bone wax early in the case because of bleeding.  There was minimal oozing, but I did place an 8-inch Hemovac drain subfascially which will be removed tomorrow.  The drain was brought out through the skin to the right side and attached with a 2-0 nylon suture.  The thoracolumbar fascia was then reapposed using #1 Vicryl suture in an interrupted fashion.  The subcutaneous layer was closed using 0 and 2-0 Vicryl suture in an inverted fashion.  The skin was closed using a subcuticular 3-0 undyed Vicryl in running fashion.  There were no intraoperative complications.  The estimated blood loss is about 50 mL maximum.  The sponge and needle counts were correct.  At the time of dictation, the patient has not yet been transferred into the recovery room so as not been examined postoperatively.     Nelda Severe, MD     MT/MEDQ  D:  12/29/2012  T:  12/30/2012  Job:  [id number]

## 2013-01-02 ENCOUNTER — Encounter (HOSPITAL_COMMUNITY): Payer: Self-pay | Admitting: Orthopedic Surgery

## 2015-02-22 ENCOUNTER — Ambulatory Visit (INDEPENDENT_AMBULATORY_CARE_PROVIDER_SITE_OTHER): Payer: Managed Care, Other (non HMO)

## 2015-02-22 ENCOUNTER — Ambulatory Visit (INDEPENDENT_AMBULATORY_CARE_PROVIDER_SITE_OTHER): Payer: Managed Care, Other (non HMO) | Admitting: Podiatry

## 2015-02-22 ENCOUNTER — Encounter: Payer: Self-pay | Admitting: Podiatry

## 2015-02-22 VITALS — BP 123/88 | HR 77 | Resp 16 | Ht 72.0 in | Wt 175.0 lb

## 2015-02-22 DIAGNOSIS — L89891 Pressure ulcer of other site, stage 1: Secondary | ICD-10-CM | POA: Diagnosis not present

## 2015-02-22 DIAGNOSIS — R52 Pain, unspecified: Secondary | ICD-10-CM

## 2015-02-22 DIAGNOSIS — I739 Peripheral vascular disease, unspecified: Secondary | ICD-10-CM | POA: Diagnosis not present

## 2015-02-22 DIAGNOSIS — L97521 Non-pressure chronic ulcer of other part of left foot limited to breakdown of skin: Secondary | ICD-10-CM

## 2015-02-22 MED ORDER — KETOCONAZOLE 2 % EX CREA: 1.0000 "application " | TOPICAL_CREAM | Freq: Two times a day (BID) | CUTANEOUS | Status: DC

## 2015-02-22 MED ORDER — MUPIROCIN 2 % EX OINT: TOPICAL_OINTMENT | CUTANEOUS | Status: DC

## 2015-02-22 MED ORDER — CLINDAMYCIN HCL 150 MG PO CAPS: 150.0000 mg | ORAL_CAPSULE | Freq: Three times a day (TID) | ORAL | Status: DC

## 2015-02-22 NOTE — Progress Notes (Signed)
   Subjective:    Patient ID: Harold [name], male    DOB: 10-Jun-1981, 34 y.o.   MRN: [id number]  HPI Left great toe, i think is infected, it started as a large crack in the skin , callused area, try treating with otc stuff, been to the doctor was prescribed antibiotic and also urea cream , had glucose testing all blood work normal. Had toenail cut back. This has been going on since the end of February    Review of Systems  All other systems reviewed and are negative.      Objective:   Physical Exam: I have reviewed his past mental history medications allergies surgery social history and review of systems. Pulses are strongly palpable right foot minimally palpable left foot. Severe blanching of the left foot with elevation. Neurologic sensorium is intact person's Weinstein monofilament deep tendon reflexes are intact bilaterally muscle strength +5 over 5 dorsiflexion plantar flexors and inverters everters onto his musculature is intact. Orthopedic evaluation demonstrates all joints distal to the ankle for range of motion without crepitation. Cutaneous evaluation demonstrated normal hair distribution both feet are warm to the touch than the left foot is slightly cooler. He has moderate to severe erythema extending from the distal medial aspect of his left hallux to the level of the metatarsophalangeal joint. The left hallux is exquisitely painful on palpation. Superficial ulceration measuring 3 mm in diameter to the distal medial aspect of the toe with dried callus surrounding it. Radiographic evaluation does not get a straight foreign body nor does it demonstrate any osseous involvement at this point. He also demonstrates a mild tinea pedis to the plantar medial arch bilaterally.        Assessment & Plan:  Assessment: I think he definitely has some type of vascular disease to the left lower extremity whether or not this is the sole cause of this ulceration and subsequent infection I don't know for  certain. I do know that he has cellulitis infection stemming from a superficial ulceration possibly from shoe gear in conjunction with a poor degree of circulation. There is no bone involvement at this point tinea pedis is also present.  Plan: Discussed etiology and pathology conservative versus surgical therapies. We will be sending him for vascular evaluation. I started him on clindamycin 150 mg 3 times a day Epsom salts and water soaks. Started him on Bactroban ointment after soaking. I also started him on ketoconazole for the tinea pedis. Placed him in a Darco shoe. Encouraged him to discontinue work for period of time until this heals and he refused. I will follow up with him once his vascular evaluations been performed. Otherwise I'll see him in 2 weeks

## 2015-02-22 NOTE — Patient Instructions (Signed)
Epsom Salt Soak Instructions   Place 1/4 cup of epsom salts in a quart of warm tap water.  Soak your foot or feet in the solution for 20 minutes twice a day until you notice the area has dried and a scab has formed. Continue to apply other medications to the area as directed by the doctor such as polysporin, neosporin or cortisporin drops.  IF YOUR SKIN BECOMES IRRITATED WHILE USING THESE INSTRUCTIONS, IT IS OKAY TO SWITCH TO  WHITE VINEGAR AND WATER. Or you may use antibacterial soap and water to keep the toe clean  Monitor for any signs/symptoms of infection. Call the office immediately if any occur or go directly to the emergency room. Call with any questions/concerns. 

## 2015-03-08 ENCOUNTER — Encounter: Payer: Self-pay | Admitting: Podiatry

## 2015-03-08 ENCOUNTER — Ambulatory Visit (INDEPENDENT_AMBULATORY_CARE_PROVIDER_SITE_OTHER): Payer: Managed Care, Other (non HMO) | Admitting: Podiatry

## 2015-03-08 VITALS — BP 138/82 | HR 57 | Resp 16

## 2015-03-08 DIAGNOSIS — L97521 Non-pressure chronic ulcer of other part of left foot limited to breakdown of skin: Secondary | ICD-10-CM | POA: Diagnosis not present

## 2015-03-08 DIAGNOSIS — I739 Peripheral vascular disease, unspecified: Secondary | ICD-10-CM

## 2015-03-08 NOTE — Progress Notes (Signed)
He presents today for follow-up of an ischemic ulcer hallux left. He states that when the vascular group performed the Doppler there was no blood flow past his left ankle. He states that the doctor wants to do an angiogram the 31st of this month. He states that really there has been no change in the wound as of yet.  Objective: Vital signs are stable alert and oriented 3. No change in the pulses left lower extremity ulceration appears to demonstrate no change.  Assessment: Ischemic ulceration left hallux.  Plan: Follow up with me after the angioplasty.

## 2015-03-21 ENCOUNTER — Inpatient Hospital Stay
Admission: RE | Admit: 2015-03-21 | Discharge: 2015-03-22 | DRG: 254 | Disposition: A | Discharge status: Home / Self Care | Payer: Managed Care, Other (non HMO) | Source: Ambulatory Visit | Attending: Vascular Surgery | Admitting: Vascular Surgery

## 2015-03-21 ENCOUNTER — Encounter: Payer: Self-pay | Admitting: *Deleted

## 2015-03-21 ENCOUNTER — Encounter
Admission: RE | Disposition: A | Discharge status: Home / Self Care | Payer: Self-pay | Source: Ambulatory Visit | Attending: Vascular Surgery

## 2015-03-21 DIAGNOSIS — I70245 Atherosclerosis of native arteries of left leg with ulceration of other part of foot: Secondary | ICD-10-CM | POA: Diagnosis present

## 2015-03-21 DIAGNOSIS — M62262 Nontraumatic ischemic infarction of muscle, left lower leg: Secondary | ICD-10-CM | POA: Diagnosis present

## 2015-03-21 HISTORY — PX: PERIPHERAL VASCULAR CATHETERIZATION: SHX172C

## 2015-03-21 LAB — CREATININE, SERUM
Creatinine, Ser: 1 mg/dL (ref 0.61–1.24)
GFR calc Af Amer: >60 mL/min (ref >60)

## 2015-03-21 LAB — GLUCOSE, CAPILLARY: Glucose-Capillary: 100 mg/dL — ABNORMAL HIGH (ref 65–99)

## 2015-03-21 LAB — BUN: BUN: 15 mg/dL (ref 6–20)

## 2015-03-21 SURGERY — LOWER EXTREMITY ANGIOGRAPHY
Anesthesia: Moderate Sedation | Laterality: Left

## 2015-03-21 MED ORDER — FENTANYL CITRATE (PF) 100 MCG/2ML IJ SOLN
INTRAMUSCULAR | Status: AC
  Filled 2015-03-21: qty 2

## 2015-03-21 MED ORDER — TIROFIBAN HCL IV 5 MG/100ML
0.1500 ug/kg/min | INTRAVENOUS | Status: AC
  Administered 2015-03-21 (×3): 0.15 ug/kg/min via INTRAVENOUS
  Filled 2015-03-21 (×3): qty 100

## 2015-03-21 MED ORDER — HEPARIN SODIUM (PORCINE) 1000 UNIT/ML IJ SOLN
INTRAMUSCULAR | Status: DC | PRN
  Administered 2015-03-21: 4000 [IU] via INTRAVENOUS

## 2015-03-21 MED ORDER — HYDROMORPHONE HCL 1 MG/ML IJ SOLN
INTRAMUSCULAR | Status: AC
  Filled 2015-03-21: qty 1

## 2015-03-21 MED ORDER — HEPARIN (PORCINE) IN NACL 2-0.9 UNIT/ML-% IJ SOLN
INTRAMUSCULAR | Status: AC
  Filled 2015-03-21: qty 1000

## 2015-03-21 MED ORDER — TIROFIBAN (AGGRASTAT) BOLUS VIA INFUSION: 25.0000 ug/kg | Freq: Once | INTRAVENOUS | Status: DC

## 2015-03-21 MED ORDER — GUAIFENESIN-DM 100-10 MG/5ML PO SYRP: 15.0000 mL | ORAL_SOLUTION | ORAL | Status: DC | PRN

## 2015-03-21 MED ORDER — KETOCONAZOLE 2 % EX CREA
1.0000 "application " | TOPICAL_CREAM | Freq: Two times a day (BID) | CUTANEOUS | Status: DC
  Administered 2015-03-21 – 2015-03-22 (×2): 1 via TOPICAL
  Filled 2015-03-21: qty 15

## 2015-03-21 MED ORDER — ASPIRIN 81 MG PO CHEW
CHEWABLE_TABLET | ORAL | Status: AC
  Filled 2015-03-21: qty 1

## 2015-03-21 MED ORDER — MIDAZOLAM HCL 2 MG/2ML IJ SOLN
INTRAMUSCULAR | Status: DC | PRN
  Administered 2015-03-21: 1 mg via INTRAVENOUS
  Administered 2015-03-21: 3 mg via INTRAVENOUS
  Administered 2015-03-21 (×5): 1 mg via INTRAVENOUS

## 2015-03-21 MED ORDER — MIDAZOLAM HCL 5 MG/5ML IJ SOLN
INTRAMUSCULAR | Status: AC
  Filled 2015-03-21: qty 5

## 2015-03-21 MED ORDER — IOHEXOL 300 MG/ML  SOLN
INTRAMUSCULAR | Status: DC | PRN
  Administered 2015-03-21: 110 mL via INTRAVENOUS

## 2015-03-21 MED ORDER — ASPIRIN EC 81 MG PO TBEC
81.0000 mg | DELAYED_RELEASE_TABLET | Freq: Every day | ORAL | Status: DC
  Administered 2015-03-21 – 2015-03-22 (×2): 81 mg via ORAL
  Filled 2015-03-21 (×4): qty 1

## 2015-03-21 MED ORDER — LABETALOL HCL 5 MG/ML IV SOLN: 10.0000 mg | INTRAVENOUS | Status: DC | PRN

## 2015-03-21 MED ORDER — CLOPIDOGREL BISULFATE 75 MG PO TABS
300.0000 mg | ORAL_TABLET | Freq: Once | ORAL | Status: AC
  Administered 2015-03-21: 300 mg via ORAL

## 2015-03-21 MED ORDER — MAGNESIUM SULFATE 2 GM/50ML IV SOLN
2.0000 g | Freq: Every day | INTRAVENOUS | Status: DC | PRN
  Filled 2015-03-21: qty 50

## 2015-03-21 MED ORDER — FENTANYL CITRATE (PF) 100 MCG/2ML IJ SOLN
INTRAMUSCULAR | Status: DC | PRN
  Administered 2015-03-21: 100 ug via INTRAVENOUS
  Administered 2015-03-21 (×3): 50 ug via INTRAVENOUS
  Administered 2015-03-21: 100 ug via INTRAVENOUS
  Administered 2015-03-21 (×3): 50 ug via INTRAVENOUS

## 2015-03-21 MED ORDER — SODIUM CHLORIDE 0.9 % IJ SOLN
INTRAMUSCULAR | Status: AC
  Filled 2015-03-21: qty 15

## 2015-03-21 MED ORDER — DOCUSATE SODIUM 100 MG PO CAPS
100.0000 mg | ORAL_CAPSULE | Freq: Every day | ORAL | Status: DC
  Administered 2015-03-22: 100 mg via ORAL
  Filled 2015-03-21 (×2): qty 1

## 2015-03-21 MED ORDER — MIDAZOLAM HCL 2 MG/2ML IJ SOLN
INTRAMUSCULAR | Status: AC
  Filled 2015-03-21: qty 2

## 2015-03-21 MED ORDER — HEPARIN SODIUM (PORCINE) 1000 UNIT/ML IJ SOLN
INTRAMUSCULAR | Status: AC
  Filled 2015-03-21: qty 1

## 2015-03-21 MED ORDER — NITROGLYCERIN 5 MG/ML IV SOLN
INTRAVENOUS | Status: AC
  Filled 2015-03-21: qty 10

## 2015-03-21 MED ORDER — NITROGLYCERIN 1 MG/10 ML FOR IR/CATH LAB
INTRA_ARTERIAL | Status: DC | PRN
  Administered 2015-03-21: 400 ug via INTRA_ARTERIAL
  Administered 2015-03-21: 600 ug via INTRA_ARTERIAL
  Administered 2015-03-21: 200 ug via INTRA_ARTERIAL

## 2015-03-21 MED ORDER — SODIUM CHLORIDE 0.9 % IV SOLN
INTRAVENOUS | Status: DC
  Administered 2015-03-21: 07:00:00 via INTRAVENOUS

## 2015-03-21 MED ORDER — PANTOPRAZOLE SODIUM 40 MG PO TBEC
40.0000 mg | DELAYED_RELEASE_TABLET | Freq: Every day | ORAL | Status: DC
  Administered 2015-03-21 – 2015-03-22 (×2): 40 mg via ORAL
  Filled 2015-03-21 (×2): qty 1

## 2015-03-21 MED ORDER — OXYCODONE HCL 5 MG PO TABS
5.0000 mg | ORAL_TABLET | ORAL | Status: DC | PRN
  Administered 2015-03-21: 10 mg via ORAL
  Filled 2015-03-21: qty 2

## 2015-03-21 MED ORDER — TIROFIBAN HCL IV 5 MG/100ML
INTRAVENOUS | Status: AC
  Filled 2015-03-21: qty 100

## 2015-03-21 MED ORDER — MUPIROCIN 2 % EX OINT
TOPICAL_OINTMENT | Freq: Every day | CUTANEOUS | Status: DC
  Administered 2015-03-22: 11:00:00 via TOPICAL
  Filled 2015-03-21: qty 22

## 2015-03-21 MED ORDER — CEFAZOLIN SODIUM 1-5 GM-% IV SOLN
1.0000 g | Freq: Once | INTRAVENOUS | Status: AC
  Administered 2015-03-21: 1 g via INTRAVENOUS

## 2015-03-21 MED ORDER — CLOPIDOGREL BISULFATE 75 MG PO TABS
75.0000 mg | ORAL_TABLET | Freq: Every day | ORAL | Status: DC
  Administered 2015-03-22: 75 mg via ORAL
  Filled 2015-03-21: qty 1

## 2015-03-21 MED ORDER — CLOPIDOGREL BISULFATE 75 MG PO TABS
ORAL_TABLET | ORAL | Status: AC
  Filled 2015-03-21: qty 4

## 2015-03-21 MED ORDER — METOPROLOL TARTRATE 1 MG/ML IV SOLN: 2.0000 mg | INTRAVENOUS | Status: DC | PRN

## 2015-03-21 MED ORDER — TIROFIBAN (AGGRASTAT) BOLUS VIA INFUSION
25.0000 ug/kg | Freq: Once | INTRAVENOUS | Status: AC
  Administered 2015-03-21: 1950 ug via INTRAVENOUS
  Filled 2015-03-21: qty 39

## 2015-03-21 MED ORDER — SODIUM CHLORIDE 0.9 % IV SOLN
INTRAVENOUS | Status: DC
  Administered 2015-03-21: 21:00:00 via INTRAVENOUS

## 2015-03-21 MED ORDER — ALUM & MAG HYDROXIDE-SIMETH 200-200-20 MG/5ML PO SUSP: 15.0000 mL | ORAL | Status: DC | PRN

## 2015-03-21 MED ORDER — CEFAZOLIN SODIUM 1-5 GM-% IV SOLN
INTRAVENOUS | Status: AC
  Filled 2015-03-21: qty 50

## 2015-03-21 MED ORDER — SODIUM CHLORIDE 0.9 % IJ SOLN
INTRAMUSCULAR | Status: AC
  Filled 2015-03-21: qty 50

## 2015-03-21 MED ORDER — PHENOL 1.4 % MT LIQD
1.0000 | OROMUCOSAL | Status: DC | PRN
  Filled 2015-03-21: qty 177

## 2015-03-21 MED ORDER — LIDOCAINE HCL (PF) 1 % IJ SOLN
INTRAMUSCULAR | Status: AC
  Filled 2015-03-21: qty 10

## 2015-03-21 MED ORDER — ONDANSETRON HCL 4 MG/2ML IJ SOLN: 4.0000 mg | Freq: Four times a day (QID) | INTRAMUSCULAR | Status: DC | PRN

## 2015-03-21 MED ORDER — LIDOCAINE HCL 1 % IJ SOLN
INTRAMUSCULAR | Status: DC | PRN
  Administered 2015-03-21: 10 mL via INTRADERMAL

## 2015-03-21 MED ORDER — ACETAMINOPHEN 325 MG RE SUPP: 325.0000 mg | RECTAL | Status: DC | PRN

## 2015-03-21 MED ORDER — HYDROMORPHONE HCL 1 MG/ML IJ SOLN
1.0000 mg | Freq: Once | INTRAMUSCULAR | Status: AC | PRN
  Administered 2015-03-21: 1 mg via INTRAVENOUS

## 2015-03-21 MED ORDER — HYDROMORPHONE HCL 1 MG/ML IJ SOLN
0.5000 mg | INTRAMUSCULAR | Status: DC | PRN
  Administered 2015-03-21 – 2015-03-22 (×4): 1 mg via INTRAVENOUS
  Filled 2015-03-21 (×3): qty 1

## 2015-03-21 MED ORDER — ACETAMINOPHEN 325 MG PO TABS: 325.0000 mg | ORAL_TABLET | ORAL | Status: DC | PRN

## 2015-03-21 MED ORDER — POTASSIUM CHLORIDE CRYS ER 20 MEQ PO TBCR: 20.0000 meq | EXTENDED_RELEASE_TABLET | Freq: Every day | ORAL | Status: DC | PRN

## 2015-03-21 MED ORDER — HYDRALAZINE HCL 20 MG/ML IJ SOLN: 5.0000 mg | INTRAMUSCULAR | Status: DC | PRN

## 2015-03-21 SURGICAL SUPPLY — 25 items
BALLN LUTONIX DCB 4X100X130 (BALLOONS) ×4
BALLN LUTONIX DCB 4X40X130 (BALLOONS) ×8
BALLN ULTRVRSE 3X40X130C (BALLOONS) ×4
BALLN ULTRVRSE 3X4X130 (BALLOONS) ×4 IMPLANT
BALLOON LUTONIX DCB 4X100X130 (BALLOONS) ×2 IMPLANT
BALLOON LUTONIX DCB 4X40X130 (BALLOONS) ×4 IMPLANT
BALLOON ULTRVRSE 3X40X130C (BALLOONS) ×2 IMPLANT
CATH 5FX125 BOWTIP (CATHETERS) ×4 IMPLANT
CATH ROYAL FLUSH PIG 5F 70CM (CATHETERS) ×4 IMPLANT
DEVICE PRESTO INFLATION (MISCELLANEOUS) ×4 IMPLANT
DEVICE STARCLOSE SE CLOSURE (Vascular Products) ×4 IMPLANT
GLIDECATH 4FR STR (CATHETERS) ×4 IMPLANT
GLIDEWIRE ANGLED SS 035X260CM (WIRE) ×4 IMPLANT
LIFESTENT 5X40X130 (Permanent Stent) ×4 IMPLANT
PACK ANGIOGRAPHY (CUSTOM PROCEDURE TRAY) ×4 IMPLANT
SET INTRO CAPELLA COAXIAL (SET/KITS/TRAYS/PACK) ×4 IMPLANT
SHEATH BRITE TIP 5FRX11 (SHEATH) ×4 IMPLANT
SHEATH RAABE 6FR (SHEATH) ×4 IMPLANT
STENT LIFESTENT 5X60 (Permanent Stent) ×4 IMPLANT
STENT VIABAHN 5X50X120 (Permanent Stent) ×4 IMPLANT
SYR MEDRAD MARK V 150ML (SYRINGE) ×4 IMPLANT
TUBING CONTRAST HIGH PRESS 72 (TUBING) ×4 IMPLANT
WIRE G V18X300CM (WIRE) ×4 IMPLANT
WIRE HI TORQ VERSACORE 300 (WIRE) ×4 IMPLANT
WIRE J 3MM .035X145CM (WIRE) ×4 IMPLANT

## 2015-03-21 NOTE — Progress Notes (Signed)
Dilaudid 1mg  ivp given for 7/10 left foot pain. Pt transferred to [facility] 3. Pt wife directed to ccu waiting room.

## 2015-03-21 NOTE — Progress Notes (Signed)
Report called to ccu nurse cheryl. Pt rates 6/10 left foot pain, foot warm and pink to touch. Right groin w/pad intact no hematoma noted. HR 40s-50s sinus brady. aggrastat infusing LPIV @14  ml/hr, ns 75 ml/hr.

## 2015-03-22 ENCOUNTER — Encounter: Payer: Self-pay | Admitting: Vascular Surgery

## 2015-03-22 LAB — HEMOGLOBIN AND HEMATOCRIT, BLOOD
HEMATOCRIT: 40.7 % (ref 40.0–52.0)
HEMOGLOBIN: 13.7 g/dL (ref 13.0–18.0)

## 2015-03-22 MED ORDER — PANTOPRAZOLE SODIUM 40 MG PO TBEC: 40.0000 mg | DELAYED_RELEASE_TABLET | Freq: Every day | ORAL | Status: DC

## 2015-03-22 MED ORDER — ASPIRIN 81 MG PO TBEC: 81.0000 mg | DELAYED_RELEASE_TABLET | Freq: Every day | ORAL | Status: DC

## 2015-03-22 MED ORDER — CLOPIDOGREL BISULFATE 75 MG PO TABS: 75.0000 mg | ORAL_TABLET | Freq: Every day | ORAL | Status: DC

## 2015-03-22 MED ORDER — LABETALOL HCL 5 MG/ML IV SOLN: 10.0000 mg | INTRAVENOUS | Status: DC | PRN

## 2015-03-22 MED ORDER — DOCUSATE SODIUM 100 MG PO CAPS: 100.0000 mg | ORAL_CAPSULE | Freq: Every day | ORAL | Status: DC

## 2015-03-22 MED ORDER — PHENOL 1.4 % MT LIQD
1.0000 | OROMUCOSAL | Status: DC | PRN
  Filled 2015-03-22: qty 177

## 2015-03-22 MED ORDER — ALUM & MAG HYDROXIDE-SIMETH 200-200-20 MG/5ML PO SUSP: 15.0000 mL | ORAL | Status: DC | PRN

## 2015-03-22 MED ORDER — METOPROLOL TARTRATE 1 MG/ML IV SOLN: 2.0000 mg | INTRAVENOUS | Status: DC | PRN

## 2015-03-22 MED ORDER — ONDANSETRON HCL 4 MG/2ML IJ SOLN: 4.0000 mg | Freq: Four times a day (QID) | INTRAMUSCULAR | Status: DC | PRN

## 2015-03-22 MED ORDER — GUAIFENESIN-DM 100-10 MG/5ML PO SYRP: 15.0000 mL | ORAL_SOLUTION | ORAL | Status: DC | PRN

## 2015-03-22 MED ORDER — HYDRALAZINE HCL 20 MG/ML IJ SOLN: 5.0000 mg | INTRAMUSCULAR | Status: DC | PRN

## 2015-03-22 NOTE — Progress Notes (Signed)
After receiving report patient started yelling out for help at 07:27 AM. Upon entering the room there was a large pool of blood and IV fluid noted on the left side of his bed. Patient had a stopcock that was partially opened which was connected to his left AC IV. Patient was very anxious and upset. Flushed IV line, removed stopcock extension, and restarted IV fluids. Previous shift RN accompanied me to bedside to assess the patient. Then notified Dr. Gilda CreaseSchnier of event and ordered stat hemoglobin and hematocrit. Patients vital signs are within normal range and will continue to assess and monitor.

## 2015-03-22 NOTE — Discharge Summary (Signed)
[facility]    Discharge Summary    Patient ID:  Harold [name] MRN: [id number] DOB/AGE: January 29, 1981 34 y.o.  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 Back pain   . Arthritis   . Shingles     Procedure(s): Lower Extremity Angiography Lower Extremity Intervention  Discharged Condition: good  HPI:  The patient presented to the office with ulceration of the great toe which had been nonhealing since January area it was associated with significant pain made worse with elevation. Physical examination showed nonpalpable pedal pulses on the left. Duplex ultrasound of the arterial system left leg demonstrated occlusive disease below the knee with monophasic signals at the ankle. He is undergoing angiography with the hope for intervention for limb salvage  Hospital Course:  Nikolas Casher is a 34 y.o. male is S/P Left lower extremity angiography with percutaneous transluminal and plasty and stent placement of the peroneal artery Procedure(s): Lower Extremity Angiography Lower Extremity Intervention Extubated: POD # 0 Cedar was done under conscious sedation patient was not intubated during the course of his treatment Physical exam: Right groin site weaned dry and intact safeguard has been removed. Left foot is hyperemic very warm with brisk capillary refill ulcer on the toe was uninfected there are nonpalpable pedal pulses however Doppler signal in the posterior tibial distribution is quite strong Post-op wounds clean, dry, intact or healing well Pt. Ambulating, voiding and taking PO diet without  difficulty. Pt pain controlled with PO pain meds. Labs as below Complications:none  Consults:     Significant Diagnostic Studies: CBC Lab Results  Component Value Date   WBC 14.5* 12/28/2012   HGB 13.7 03/22/2015   HCT 40.7 03/22/2015   MCV 91.5 12/28/2012   PLT 227 12/28/2012    BMET    Component Value Date/Time   NA 141 12/28/2012 0900   K 4.2 12/28/2012 0900   CL 102 12/28/2012 0900   CO2 32 12/28/2012 0900   GLUCOSE 109* 12/28/2012 0900   BUN 15 03/21/2015 0716   CREATININE 1.00 03/21/2015 0716   CALCIUM 9.6 12/28/2012 0900   GFRNONAA >60 03/21/2015 0716   GFRAA >60 03/21/2015 0716   COAG Lab Results  Component Value Date   INR 1.01 12/28/2012     Disposition:  Discharge to :Home    Medication List    TAKE these medications        aspirin 81 MG EC tablet  Take 1 tablet (81 mg total) by mouth daily.     clindamycin 150 MG capsule  Commonly known as:  CLEOCIN  Take 1 capsule (150 mg total) by mouth 3 (three) times daily.     clopidogrel 75 MG tablet  Commonly known as:  PLAVIX  Take 1 tablet (75 mg total) by mouth daily.     HYDROcodone-acetaminophen 10-325 MG per tablet  Commonly known as:  NORCO  Take 1 tablet by mouth every 6 (six) hours as needed for pain.     ketoconazole 2 % cream  Commonly known as:  NIZORAL  Apply 1 application topically 2 (two) times daily.     mupirocin ointment 2 %  Commonly known as:  [employer]  Apply to wound after soaking BID       Verbal and written Discharge instructions given to the patient. Wound care per Discharge AVS     Follow-up Information    Follow up with Dhruvan Gullion, [name], MD In 1 week.   Specialties:  Vascular Surgery, Cardiology, Radiology, Vascular Surgery   Contact information:   [address] [place] [address] [phone]       Signed: Renford DillsSchnier, Judea Fennimore G, MD  03/22/2015, 10:45 AM

## 2015-03-22 NOTE — Progress Notes (Signed)
Reviewed discharge information with patient and spouse at bedside. Provided him with copy of all instructions, appointment information, prescriptions, and stent cards. Patient and spouse verbalized understanding of all instructions. Right groin is clean, dry, and intact with no noted bleeding, IV's removed and dressings applied. No other changes noted. Patient wheeled to lobby for discharge home.

## 2015-03-22 NOTE — Discharge Instructions (Signed)
1. 1. Keep the site clean and dry with a band aide daily until it has healed. 2. Gradually increase your activity as tolerated. 3. No strenuous exercise or lifting over 10 pounds for one week 4. No driving for 24 hours 5. Avoid tub baths, hot tubs or swimming for 3 days. Showers are okay. 6. A bruise or small lump at the site is common. It should disappear in a few weeks. 7. Some mild discomfort at the site is common during the healing process. 8. If you bleed at the site hold firm pressure and if it does not stop call physicians office or call 911. 9. If you have severe pain, develop fever, numbness in either leg or of it becomes cold or bluish in color, call the physicians office.

## 2015-03-29 ENCOUNTER — Encounter: Payer: Self-pay | Admitting: Podiatry

## 2015-03-29 ENCOUNTER — Ambulatory Visit (INDEPENDENT_AMBULATORY_CARE_PROVIDER_SITE_OTHER): Payer: Managed Care, Other (non HMO) | Admitting: Podiatry

## 2015-03-29 VITALS — BP 129/81 | HR 65 | Resp 16 | Ht 72.0 in | Wt 170.0 lb

## 2015-03-29 DIAGNOSIS — L89891 Pressure ulcer of other site, stage 1: Secondary | ICD-10-CM | POA: Diagnosis not present

## 2015-03-29 DIAGNOSIS — L97521 Non-pressure chronic ulcer of other part of left foot limited to breakdown of skin: Secondary | ICD-10-CM

## 2015-03-29 DIAGNOSIS — I739 Peripheral vascular disease, unspecified: Secondary | ICD-10-CM | POA: Diagnosis not present

## 2015-03-29 MED ORDER — CEPHALEXIN 500 MG PO CAPS: 500.0000 mg | ORAL_CAPSULE | Freq: Three times a day (TID) | ORAL | Status: DC

## 2015-03-29 NOTE — Progress Notes (Signed)
Harold [name] presents today for follow-up of his left toe pain. He states that the toe pain is now excruciating however it was noticed that he did have a 90% blockage in his right leg. He states that he has had 3 stents placed in his right lower leg and is currently on blood thinner. He is very thankful to us for finding this. Aching that he could've lost his leg.  Objective: Vital signs are stable he is alert and oriented 3 superficial ulceration to the distal medial aspect of the hallux left with mild surrounding erythema to the level of the DIPJ indicative of ischemic disease possibly associated with mild infection but no open wound at this time.  Assessment: I encouraged him to continue all conservative therapies and continue use of the Darco shoe. I will write a prescription for him Keflex 500 mg 1 by mouth 3 times a day 30 and I will follow-up with him in 2 weeks for reevaluation.

## 2015-04-12 ENCOUNTER — Ambulatory Visit: Payer: Managed Care, Other (non HMO) | Admitting: Podiatry

## 2015-05-10 ENCOUNTER — Ambulatory Visit: Payer: Managed Care, Other (non HMO) | Admitting: Podiatry

## 2015-05-24 ENCOUNTER — Encounter: Payer: Self-pay | Admitting: Podiatry

## 2015-05-24 ENCOUNTER — Ambulatory Visit (INDEPENDENT_AMBULATORY_CARE_PROVIDER_SITE_OTHER): Payer: Managed Care, Other (non HMO) | Admitting: Podiatry

## 2015-05-24 VITALS — BP 131/87 | HR 80 | Resp 16

## 2015-05-24 DIAGNOSIS — L97521 Non-pressure chronic ulcer of other part of left foot limited to breakdown of skin: Secondary | ICD-10-CM | POA: Diagnosis not present

## 2015-05-24 DIAGNOSIS — L98491 Non-pressure chronic ulcer of skin of other sites limited to breakdown of skin: Secondary | ICD-10-CM

## 2015-05-24 MED ORDER — CEPHALEXIN 500 MG PO CAPS: 500.0000 mg | ORAL_CAPSULE | Freq: Three times a day (TID) | ORAL | Status: DC

## 2015-05-24 MED ORDER — NITROGLYCERIN 0.2 MG/HR TD PT24: 0.2000 mg | MEDICATED_PATCH | Freq: Every day | TRANSDERMAL | Status: DC

## 2015-05-24 NOTE — Progress Notes (Signed)
He presents today for follow-up of ischemic ulcer to the hallux left. He states that it is still extremely painful but I do not have to keep it wrapped all the time at this point. I have a new area of irritation from hitting it on the shower and got a small cut which is now a new area that I just have to heal. He states that he saw the vascular doctors recently and they are still concerned about his general ischemia to his left foot.  Objective: Ischemic ulceration medial nail fold hallux left secondary to limb threatening stenosis. Appears to be slightly infected at this point.  Assessment: Ischemic toe with mild paronychia hallux left.  Plan: He will continue all conservative therapies. I wrote a prescription for nitroglycerin patch 0.2 mg to be flat for 24 hours and a 12 hour break. Also wrote a prescription for an antibiotic for the paronychia. Follow up with him in about 3 weeks

## 2015-06-14 ENCOUNTER — Ambulatory Visit (INDEPENDENT_AMBULATORY_CARE_PROVIDER_SITE_OTHER): Payer: Managed Care, Other (non HMO) | Admitting: Podiatry

## 2015-06-14 DIAGNOSIS — L98491 Non-pressure chronic ulcer of skin of other sites limited to breakdown of skin: Secondary | ICD-10-CM

## 2015-06-14 DIAGNOSIS — L97521 Non-pressure chronic ulcer of other part of left foot limited to breakdown of skin: Secondary | ICD-10-CM

## 2015-06-14 DIAGNOSIS — M79673 Pain in unspecified foot: Secondary | ICD-10-CM | POA: Diagnosis not present

## 2015-06-14 DIAGNOSIS — B351 Tinea unguium: Secondary | ICD-10-CM

## 2015-06-14 NOTE — Progress Notes (Signed)
He presents today for follow-up of his ischemic disease to his left hallux. It appears that his stent is patent and a pulses palpable. The ulceration to the left hallux has gone on to heal. He continues to use his nitroglycerin patch to the toe which is helping he says and he seems to be healing the wounds. He does have one area this still extremely painful and is beneath the medial margin of the tibial border of the hallux nail. This is superficial ulceration and does not appear to be infected he says.  Objective: Vital signs are stable he is alert and oriented 3 pulse are palpable left foot. Still demonstrates ischemic-type erythema to the distal aspect of the hallux left one has gone on to heal however a superficial ulceration remains beneath the medial border of the hallux left nail plate. Disappear he owns and no malodor.  Assessment: Ischemic limb left slowly resolving ulceration hallux. Sharp incurvated nail margin hallux left.  Plan: I applied Deretha Emory for proximally 20 minutes and then resected the margin of the nail sharply. This should alleviate his symptoms and allow for pressure to be reduced. I will follow-up with him in approximately 1 month.

## 2015-07-17 ENCOUNTER — Encounter: Payer: Self-pay | Admitting: Podiatry

## 2015-07-17 ENCOUNTER — Ambulatory Visit (INDEPENDENT_AMBULATORY_CARE_PROVIDER_SITE_OTHER): Payer: Managed Care, Other (non HMO) | Admitting: Podiatry

## 2015-07-17 VITALS — BP 136/92 | HR 82 | Resp 12

## 2015-07-17 DIAGNOSIS — L98491 Non-pressure chronic ulcer of skin of other sites limited to breakdown of skin: Secondary | ICD-10-CM

## 2015-07-17 DIAGNOSIS — I739 Peripheral vascular disease, unspecified: Secondary | ICD-10-CM | POA: Diagnosis not present

## 2015-07-17 NOTE — Progress Notes (Signed)
He presents today for follow-up of his peripheral vascular disease which was stented several months ago. He states that he is still having pain in his left hallux but the wounds appear to be healing. He continues to smoke on a regular basis. He failed to follow-up with the vascular surgeons several weeks ago. He states that he is going to reschedule. He continues to use of the nitroglycerin patches in an effort to assist vasodilation. He states that he is doing so much better than he was before he visited our office.  Objective: Vital signs are stable he is alert and oriented 3. Palpable pulses left lower extremity with mild cyanosis to the hallux left wounds appeared to have healed uneventfully at this point I see no signs of infection. The toe is mildly tender to the touch. Currently he does not have a nitroglycerin patch on the toe.  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  Plan: I suggested that he follow-up with his primary care doctor and his vascular surgery Dr. immediately. He states that he does not have a primary care provider for which I gave him several names in a local clinic. I will follow-up with him as needed.  Arbutus Ped DPM

## 2015-09-04 ENCOUNTER — Other Ambulatory Visit: Payer: Self-pay | Admitting: Vascular Surgery

## 2015-09-05 ENCOUNTER — Encounter: Payer: Self-pay | Admitting: *Deleted

## 2015-09-05 ENCOUNTER — Encounter
Admission: RE | Disposition: A | Discharge status: Home / Self Care | Payer: Self-pay | Source: Ambulatory Visit | Attending: Vascular Surgery

## 2015-09-05 ENCOUNTER — Encounter: Payer: Self-pay | Admitting: Certified Registered Nurse Anesthetist

## 2015-09-05 ENCOUNTER — Ambulatory Visit
Admission: RE | Admit: 2015-09-05 | Discharge: 2015-09-05 | Disposition: A | Discharge status: Home / Self Care | Payer: Managed Care, Other (non HMO) | Source: Ambulatory Visit | Attending: Vascular Surgery | Admitting: Vascular Surgery

## 2015-09-05 DIAGNOSIS — F17200 Nicotine dependence, unspecified, uncomplicated: Secondary | ICD-10-CM | POA: Diagnosis not present

## 2015-09-05 DIAGNOSIS — Z7902 Long term (current) use of antithrombotics/antiplatelets: Secondary | ICD-10-CM | POA: Diagnosis not present

## 2015-09-05 DIAGNOSIS — Z79899 Other long term (current) drug therapy: Secondary | ICD-10-CM | POA: Insufficient documentation

## 2015-09-05 DIAGNOSIS — I70245 Atherosclerosis of native arteries of left leg with ulceration of other part of foot: Secondary | ICD-10-CM | POA: Diagnosis present

## 2015-09-05 DIAGNOSIS — I96 Gangrene, not elsewhere classified: Secondary | ICD-10-CM

## 2015-09-05 DIAGNOSIS — Z7982 Long term (current) use of aspirin: Secondary | ICD-10-CM | POA: Insufficient documentation

## 2015-09-05 DIAGNOSIS — L97529 Non-pressure chronic ulcer of other part of left foot with unspecified severity: Secondary | ICD-10-CM | POA: Diagnosis not present

## 2015-09-05 HISTORY — PX: PERIPHERAL VASCULAR CATHETERIZATION: SHX172C

## 2015-09-05 LAB — BUN: BUN: 16 mg/dL (ref 6–20)

## 2015-09-05 LAB — CREATININE, SERUM
Creatinine, Ser: 0.93 mg/dL (ref 0.61–1.24)
GFR calc non Af Amer: >60 mL/min (ref >60)

## 2015-09-05 SURGERY — LOWER EXTREMITY ANGIOGRAPHY
Anesthesia: Moderate Sedation | Laterality: Left

## 2015-09-05 MED ORDER — MIDAZOLAM HCL 2 MG/2ML IJ SOLN
INTRAMUSCULAR | Status: AC
  Filled 2015-09-05: qty 2

## 2015-09-05 MED ORDER — ACETAMINOPHEN 325 MG PO TABS: 325.0000 mg | ORAL_TABLET | ORAL | Status: DC | PRN

## 2015-09-05 MED ORDER — LIDOCAINE HCL (PF) 1 % IJ SOLN
INTRAMUSCULAR | Status: DC | PRN
  Administered 2015-09-05: 5 mL via INTRADERMAL

## 2015-09-05 MED ORDER — ONDANSETRON HCL 4 MG/2ML IJ SOLN: 4.0000 mg | Freq: Four times a day (QID) | INTRAMUSCULAR | Status: DC | PRN

## 2015-09-05 MED ORDER — HYDROMORPHONE HCL 1 MG/ML IJ SOLN
0.5000 mg | INTRAMUSCULAR | Status: DC | PRN
  Administered 2015-09-05: 1 mg via INTRAVENOUS

## 2015-09-05 MED ORDER — FENTANYL CITRATE (PF) 100 MCG/2ML IJ SOLN
INTRAMUSCULAR | Status: AC
  Filled 2015-09-05: qty 2

## 2015-09-05 MED ORDER — HEPARIN SODIUM (PORCINE) 1000 UNIT/ML IJ SOLN
INTRAMUSCULAR | Status: DC | PRN
  Administered 2015-09-05: 5000 [IU] via INTRAVENOUS

## 2015-09-05 MED ORDER — SODIUM CHLORIDE 0.9 % IJ SOLN
INTRAMUSCULAR | Status: AC
  Filled 2015-09-05: qty 15

## 2015-09-05 MED ORDER — MIDAZOLAM HCL 2 MG/2ML IJ SOLN
INTRAMUSCULAR | Status: DC | PRN
  Administered 2015-09-05: 2 mg via INTRAVENOUS
  Administered 2015-09-05 (×6): 1 mg via INTRAVENOUS

## 2015-09-05 MED ORDER — MIDAZOLAM HCL 5 MG/5ML IJ SOLN
INTRAMUSCULAR | Status: AC
  Filled 2015-09-05: qty 5

## 2015-09-05 MED ORDER — OXYCODONE HCL 5 MG PO TABS: 5.0000 mg | ORAL_TABLET | ORAL | Status: DC | PRN

## 2015-09-05 MED ORDER — SODIUM CHLORIDE 0.9 % IV SOLN
INTRAVENOUS | Status: DC
  Administered 2015-09-05: 08:00:00 via INTRAVENOUS

## 2015-09-05 MED ORDER — ACETAMINOPHEN 325 MG RE SUPP: 325.0000 mg | RECTAL | Status: DC | PRN

## 2015-09-05 MED ORDER — HEPARIN SODIUM (PORCINE) 1000 UNIT/ML IJ SOLN
INTRAMUSCULAR | Status: AC
  Filled 2015-09-05: qty 1

## 2015-09-05 MED ORDER — FENTANYL CITRATE (PF) 100 MCG/2ML IJ SOLN
25.0000 ug | INTRAMUSCULAR | Status: DC | PRN
  Administered 2015-09-05 (×7): 50 ug via INTRAVENOUS

## 2015-09-05 MED ORDER — DEXTROSE 5 % IV SOLN
1.5000 g | INTRAVENOUS | Status: AC
  Administered 2015-09-05: 1.5 g via INTRAVENOUS
  Filled 2015-09-05: qty 1.5

## 2015-09-05 MED ORDER — HEPARIN (PORCINE) IN NACL 2-0.9 UNIT/ML-% IJ SOLN
INTRAMUSCULAR | Status: AC
  Filled 2015-09-05: qty 1000

## 2015-09-05 MED ORDER — ALUM & MAG HYDROXIDE-SIMETH 200-200-20 MG/5ML PO SUSP: 15.0000 mL | ORAL | Status: DC | PRN

## 2015-09-05 MED ORDER — IOHEXOL 300 MG/ML  SOLN
INTRAMUSCULAR | Status: DC | PRN
  Administered 2015-09-05: 70 mL via INTRA_ARTERIAL

## 2015-09-05 MED ORDER — LIDOCAINE HCL (PF) 1 % IJ SOLN
INTRAMUSCULAR | Status: AC
  Filled 2015-09-05: qty 10

## 2015-09-05 MED ORDER — HYDROMORPHONE HCL 1 MG/ML IJ SOLN
INTRAMUSCULAR | Status: AC
  Filled 2015-09-05: qty 1

## 2015-09-05 SURGICAL SUPPLY — 14 items
CATH PIG 70CM (CATHETERS) ×4 IMPLANT
CATH SEEKER .035X150CM (CATHETERS) ×4 IMPLANT
CATH STS 5FR 125CM (CATHETERS) ×4 IMPLANT
DEVICE CLOSURE MYNXGRIP 6/7F (Vascular Products) ×4 IMPLANT
DEVICE TORQUE (MISCELLANEOUS) ×4 IMPLANT
GLIDEWIRE ANGLED SS 035X260CM (WIRE) ×4 IMPLANT
PACK ANGIOGRAPHY (CUSTOM PROCEDURE TRAY) ×4 IMPLANT
SET INTRO CAPELLA COAXIAL (SET/KITS/TRAYS/PACK) ×4 IMPLANT
SHEATH ANL2 6FRX45 HC (SHEATH) ×4 IMPLANT
SHEATH BRITE TIP 5FRX11 (SHEATH) ×4 IMPLANT
SHEATH BRITE TIP 6FRX11 (SHEATH) ×4 IMPLANT
SYR MEDRAD MARK V 150ML (SYRINGE) ×4 IMPLANT
TUBING CONTRAST HIGH PRESS 72 (TUBING) ×4 IMPLANT
WIRE J 3MM .035X145CM (WIRE) ×4 IMPLANT

## 2015-09-05 NOTE — Op Note (Signed)
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  Abdominal aortogram  2.  Left lower extremity distal runoff third order catheter placement   Anesthesia: Conscious sedation with IV Versed plus fentanyl  Sheath: 6 [place] Ansell right common femoral artery  Contrast: 70 cc  Fluoroscopy Time: 6.5 minutes  Indications:  Patient presented to the office with increasing pain in his left lower extremity and ulceration of the great toe. He has known atherosclerotic occlusive disease and has undergone interventions in the past. He is therefore being brought to the hospital for angiography with the hope for intervention for limb salvage. Of note, sadly, he is still smoking  Procedure:  Maryln ManuelJoshua Whelanis a 34 y.o. male who was identified and appropriate procedural time out was performed.  The patient was then placed supine on the table and prepped and draped in the usual sterile fashion.  Ultrasound was used to evaluate the right common femoral artery.  It was patent .  A digital ultrasound image was acquired.  A micropuncture needle was used to access the right common femoral artery under direct ultrasound guidance and a permanent image was performed.  Microwire was then advanced under fluoroscopic guidance followed by micro-sheath. A 0.035 J wire was advanced without resistance and a 5Fr sheath was placed.    Pigtail catheter was then advanced to the level of T12 and AP projection of the aorta was obtained. Pigtail catheter was then repositioned to above the bifurcation and an RAO projection of the pelvis was obtained. Stiff Glidewire pigtail catheter then used to cross the aortic bifurcation pigtail catheter was advanced down to the distal external iliac  where both and LAO projection of the femoral bifurcation was obtained as well as a magnified AP. Wire was then reintroduced and the catheter was negotiated into the SFA distal runoff was obtained.  Stiff Glidewire was reintroduced and a 6 [place] Ansell sheath was advanced up and over the bifurcation positioned with its tip in the SFA. Subsequent 5000 units of heparin was given. A seeker catheter was then advanced over the Glidewire the tibioperoneal trunk was probed but the wire continuously went into multiple collaterals. The wire was then negotiated into the anterior tibial and the seeker catheter advanced magnified images of the anterior tibial were then performed with the catheter positioned in the distal portion of the anterior tibial. After review these images the wire and seeker catheter were removed and the sheath was pulled back into the right external iliac.  Oblique view of the right groin was obtained and subsequently asked Device was deployed without difficulty  Findings:   Aortogram:  Abdominal aorta is opacified with a bolus injection contrast. Single renal arteries are noted. No evidence of renal artery stenosis. No evidence of significant atherosclerotic changes is noted there are no strictures or stenoses within the aorta bilateral common internal or external iliac arteries.  Right Lower Extremity:  Right common femoral artery is widely patent previous scar close is noted which are is in the mid common femoral  Left Lower Extremity:  There is a haziness throughout the left common femoral which prompted the AP magnified projection as well as the oblique view. In spite of trying to angle the detectors so that the bones of the pelvis were not overlying this area that haziness could never  be resolved. There does appear to be fairly good flow through the common femoral and the femoral bifurcation itself as well as the profunda femoris and SFA are widely patent. There is no evidence of  atherosclerotic changes within the femoral popliteal system.  The tibioperoneal trunk is occluded previously placed stents are occluded and after multiple images distally there appears to be reconstitution of the distal peroneal which does have one of its consistent collaterals which is fairly large and crosses the foot filling the pedal vessels. The anterior tibial is patent down to its distal one third where it abruptly occludes and there is an magnified imaging no evidence of reconstitution of the distal anterior tibial dorsalis pedis and there is no apparent contribution of this anterior tibial to the forefoot and toes. On delayed imaging there does appear to be a distal lateral plantar noted as well but this appears to feed from the peroneal.  Summary: The patient does not appear to have a good option for intervention as the length of his occlusion down to the recent constituted peroneal is quite long there artery been stents placed in this area and I doubt that any intervention would be a meaningful reconstruction. His distal peroneal is clearly the dominant vessel feeding the foot and is suitable as a distal target for bypass. Given his age at this point I would vein map his saphenous vein and plan for a popliteal to distal peroneal bypass. In addition I did utilize the ultrasound in [place] scale to evaluate the left common femoral and at least on this basic ultrasound there does appear to be plaque within the common femoral this will be further evaluated with more formal duplex ultrasound at the time of vein mapping but likely he will also need a left common femoral endarterectomy as well.    Disposition: Patient was taken to the recovery room in stable condition having tolerated the procedure well.  Vanderbilt Ranieri, [name] 09/05/2015,10:34 AM

## 2015-09-05 NOTE — Progress Notes (Signed)
Patient through recovery time with dressing clean dry intact, pulses WNL. Upon assessment of site for patient discharge, small amount new drainage noted to gauze, site oozing slightly. Redressed with gauze and foam tape after holding pressure x 15 minutes. VSS. Pulses WNL. Will continue to monitor.

## 2015-09-05 NOTE — Discharge Instructions (Signed)

## 2015-09-05 NOTE — H&P (Signed)
[facility] History & Physical Update  The patient was interviewed and re-examined.  The patient's previous History and Physical has been reviewed and is unchanged.  There is no change in the plan of care. We plan to proceed with the scheduled procedure.  Georgean Spainhower, [name], MD  09/05/2015, 10:33 AM

## 2015-09-06 ENCOUNTER — Encounter: Payer: Self-pay | Admitting: Vascular Surgery

## 2015-09-13 ENCOUNTER — Other Ambulatory Visit: Payer: Self-pay | Admitting: Vascular Surgery

## 2015-09-18 ENCOUNTER — Ambulatory Visit (INDEPENDENT_AMBULATORY_CARE_PROVIDER_SITE_OTHER): Payer: Managed Care, Other (non HMO) | Admitting: Cardiovascular Disease

## 2015-09-18 ENCOUNTER — Other Ambulatory Visit: Payer: Managed Care, Other (non HMO)

## 2015-09-18 ENCOUNTER — Encounter: Payer: Self-pay | Admitting: Cardiovascular Disease

## 2015-09-18 VITALS — BP 116/80 | HR 52 | Ht 73.0 in | Wt 179.8 lb

## 2015-09-18 DIAGNOSIS — I739 Peripheral vascular disease, unspecified: Secondary | ICD-10-CM | POA: Diagnosis not present

## 2015-09-18 DIAGNOSIS — I731 Thromboangiitis obliterans [Buerger's disease]: Secondary | ICD-10-CM | POA: Diagnosis not present

## 2015-09-18 LAB — LIPID PANEL
CHOL/HDL RATIO: 5.3 ratio — AB (ref <=5.0)
Cholesterol: 139 mg/dL (ref 125–200)
HDL: 26 mg/dL — AB (ref >=40)
LDL CALC: 91 mg/dL (ref <130)
Triglycerides: 111 mg/dL (ref <150)
VLDL: 22 mg/dL (ref <30)

## 2015-09-18 LAB — COMPREHENSIVE METABOLIC PANEL
ALK PHOS: 133 U/L — AB (ref 40–115)
ALT: 23 U/L (ref 9–46)
AST: 24 U/L (ref 10–40)
Albumin: 4.1 g/dL (ref 3.6–5.1)
BILIRUBIN TOTAL: 0.3 mg/dL (ref 0.2–1.2)
BUN: 11 mg/dL (ref 7–25)
CO2: 31 mmol/L (ref 20–31)
Calcium: 9.4 mg/dL (ref 8.6–10.3)
Chloride: 104 mmol/L (ref 98–110)
Creat: 1.03 mg/dL (ref 0.60–1.35)
GLUCOSE: 84 mg/dL (ref 65–99)
Potassium: 4.4 mmol/L (ref 3.5–5.3)
SODIUM: 140 mmol/L (ref 135–146)
Total Protein: 7 g/dL (ref 6.1–8.1)

## 2015-09-18 LAB — CBC WITH DIFFERENTIAL/PLATELET
BASOS ABS: 0.1 10*3/uL (ref 0.0–0.1)
BASOS PCT: 1 % (ref 0–1)
EOS ABS: 0.2 10*3/uL (ref 0.0–0.7)
Eosinophils Relative: 3 % (ref 0–5)
HCT: 41.3 % (ref 39.0–52.0)
Hemoglobin: 13.9 g/dL (ref 13.0–17.0)
Lymphocytes Relative: 31 % (ref 12–46)
Lymphs Abs: 2.4 10*3/uL (ref 0.7–4.0)
MCH: 30 pg (ref 26.0–34.0)
MCHC: 33.7 g/dL (ref 30.0–36.0)
MCV: 89.2 fL (ref 78.0–100.0)
MONOS PCT: 13 % — AB (ref 3–12)
MPV: 9.8 fL (ref 8.6–12.4)
Monocytes Absolute: 1 10*3/uL (ref 0.1–1.0)
NEUTROS ABS: 4 10*3/uL (ref 1.7–7.7)
NEUTROS PCT: 52 % (ref 43–77)
PLATELETS: 273 10*3/uL (ref 150–400)
RBC: 4.63 MIL/uL (ref 4.22–5.81)
RDW: 14.6 % (ref 11.5–15.5)
WBC: 7.7 10*3/uL (ref 4.0–10.5)

## 2015-09-18 NOTE — Patient Instructions (Addendum)
Medication Instructions:  Your physician recommends that you continue on your current medications as directed. Please refer to the Current Medication list given to you today.   Labwork: TODAY - cholesterol, liver, basic metabolic panel CBC, Pt/INR, PTT added on for patient's vascular surgery on 11/30  Testing/Procedures: Your physician has requested that you have a lexiscan myoview. For further information please visit [email or web address] Please follow instruction sheet, as given.   Follow-Up: Your physician recommends that you schedule a follow-up appointment in: 3 months with Dr. Elease HashimotoNahser.    If you need a refill on your cardiac medications before your next appointment, please call your pharmacy.   Thank you for choosing [facility]! Eligha BridegroomMichelle Kean Gautreau, RN [phone]

## 2015-09-18 NOTE — Progress Notes (Signed)
Cardiology Office Note   Date:  09/18/2015   ID:  Harold [name], DOB 20-Mar-1981, MRN [id number]  PCP:  No PCP Per Patient  Cardiologist:   Vesta Mixer, MD   Chief Complaint  Patient presents with  . Pre-op Exam    problem list 1. Peripheral vascular disease 2.   History of Present Illness: Harold [name] is a 34 y.o. male who presents for pre-op evaluation for PVD. Presented to an urgent care for a non healing wound on left leg.  Has been found to have severe PVD and needs peripheral artery bypass.  Has significant claudication , even at rest.  Unable to walk even short distances without leg pain .   Smokes - 1/2 ppd now, previously it was more.     Past Medical History  Diagnosis Date  . Back pain   . Arthritis   . Shingles     Past Surgical History  Procedure Laterality Date  . Tooth extraction    . Wisdom tooth extraction    . Lumbar laminectomy/decompression microdiscectomy Right 12/29/2012    Procedure: SPINE: LAMINOTOMY DECOMPRESSION NERVE ROOT EXCISION HERNIATED DISK 1 SPACE LUMBAR;  Surgeon: Mat Carne, MD;  Location: [facility] OR;  Service: Orthopedics;  Laterality: Right;  RIGHT L5-S1 DISC EXCISION  . Peripheral vascular catheterization Left 03/21/2015    Procedure: Lower Extremity Angiography;  Surgeon: Renford Dills, MD;  Location: [facility] INVASIVE CV LAB;  Service: Cardiovascular;  Laterality: Left;  . Peripheral vascular catheterization  03/21/2015    Procedure: Lower Extremity Intervention;  Surgeon: Renford Dills, MD;  Location: [facility] INVASIVE CV LAB;  Service: Cardiovascular;;  . Peripheral vascular catheterization Left 09/05/2015    Procedure: Lower Extremity Angiography;  Surgeon: Renford Dills, MD;  Location: [facility] INVASIVE CV LAB;  Service: Cardiovascular;  Laterality: Left;  . Peripheral vascular catheterization  09/05/2015    Procedure: Lower Extremity Intervention;  Surgeon: Renford Dills, MD;  Location: [facility] INVASIVE CV LAB;   Service: Cardiovascular;;     Current Outpatient Prescriptions  Medication Sig Dispense Refill  . aspirin EC 81 MG EC tablet Take 1 tablet (81 mg total) by mouth daily. 30 tablet 6  . clopidogrel (PLAVIX) 75 MG tablet Take 1 tablet (75 mg total) by mouth daily. 30 tablet 6  . Oxycodone HCl 20 MG TABS Take 20 mg by mouth every 4 (four) hours as needed. AS NEEDED FOR PAIN    . OXYCONTIN 30 MG 12 hr tablet Take 30 mg by mouth 2 (two) times daily.    . nitroGLYCERIN (NITRO-DUR) 0.2 mg/hr patch Place 1 patch (0.2 mg total) onto the skin daily. (Patient not taking: Reported on 09/05/2015) 30 patch 12   No current facility-administered medications for this visit.    Allergies:   Review of patient's allergies indicates no known allergies.    Social History:  The patient  reports that he has been smoking Cigarettes.  He has a 16 pack-year smoking history. He has never used smokeless tobacco. He reports that he does not drink alcohol or use illicit drugs.   Family History:  The patient's family history is not on file.    ROS:  Please see the history of present illness.    Review of Systems: Constitutional:  denies fever, chills, diaphoresis, appetite change and fatigue.  HEENT: denies photophobia, eye pain, redness, hearing loss, ear pain, congestion, sore throat, rhinorrhea, sneezing, neck pain, neck stiffness and tinnitus.  Respiratory: denies SOB, DOE, cough, chest tightness,  and wheezing.  Cardiovascular: denies chest pain, palpitations and leg swelling.  Gastrointestinal: denies nausea, vomiting, abdominal pain, diarrhea, constipation, blood in stool.  Genitourinary: denies dysuria, urgency, frequency, hematuria, flank pain and difficulty urinating.  Musculoskeletal: Has significant leg pain   Skin:   Neurological: denies dizziness, seizures, syncope, weakness, light-headedness, numbness and headaches.   Hematological: denies adenopathy, easy bruising, personal or family bleeding  history.  Psychiatric/ Behavioral: denies suicidal ideation, mood changes, confusion, nervousness, sleep disturbance and agitation.      All other systems are reviewed and negative.   PHYSICAL EXAM: VS:  BP 116/80 mmHg  Pulse 52  Ht 6\' 1"  (1.854 m)  Wt 179 lb 12.8 oz (81.557 kg)  BMI 23.73 kg/m2 , BMI Body mass index is 23.73 kg/(m^2). GEN: Well nourished, well developed, in no acute distress HEENT: normal Neck: no JVD, carotid bruits, or masses Cardiac: RRR; no murmurs, rubs, or gallops,no edema  Respiratory:  clear to auscultation bilaterally, normal work of breathing GI: soft, nontender, nondistended, + BS MS: no deformity or atrophy Skin: warm and dry, no rash Neuro:  Strength and sensation are intact Psych: normal   EKG:  EKG is ordered today. The ekg ordered today demonstrates sinus brady at 52.     Recent Labs: 03/22/2015: Hemoglobin 13.7 09/05/2015: BUN 16; Creatinine, Ser 0.93    Lipid Panel No results found for: CHOL, TRIG, HDL, CHOLHDL, VLDL, LDLCALC, LDLDIRECT    Wt Readings from Last 3 Encounters:  09/18/15 179 lb 12.8 oz (81.557 kg)  09/05/15 170 lb (77.111 kg)  03/29/15 170 lb (77.111 kg)      Other studies Reviewed: Additional studies/ records that were reviewed today include: . Review of the above records demonstrates:    ASSESSMENT AND PLAN:  1.  Thromboangiitis obliterans ;    he has very severe , premature peripheral vascular disease. I suspect that this is due to thromboangiitis obliterans. I've recommended that he stop smoking immediately.   Check fasting lipids and liver enzymes on him today.  He will need a Lexiscan Myoview study prior to his left leg  Revascularization.  Will try to get it tomorrow.    If the Myoview study is low risk or normal, then he should be at low risk for his upcoming surgery. He has no clinical symptoms of ischemic heart disease.      I'll see him again in 3 months for follow-up visit.   Current medicines are  reviewed at length with the patient today.  The patient does not have concerns regarding medicines.  The following changes have been made:  no change  Labs/ tests ordered today include:  No orders of the defined types were placed in this encounter.     Disposition:   FU with me in 3 months .      Katlen Seyer, Deloris PingPhilip J, MD  09/18/2015 3:18 PM    [facility] [address], [place], [place]  [address] Phone: [phone]; Fax: [phone]   [facility]  [address] [place], [place]  [address] [phone]   Fax [phone]

## 2015-09-19 ENCOUNTER — Encounter
Admission: RE | Admit: 2015-09-19 | Discharge: 2015-09-19 | Disposition: A | Discharge status: Home / Self Care | Payer: Managed Care, Other (non HMO) | Source: Ambulatory Visit | Attending: Vascular Surgery | Admitting: Vascular Surgery

## 2015-09-19 ENCOUNTER — Ambulatory Visit (HOSPITAL_COMMUNITY)
Admission: RE | Admit: 2015-09-19 | Discharge: 2015-09-19 | Disposition: A | Discharge status: Home / Self Care | Payer: Managed Care, Other (non HMO) | Source: Ambulatory Visit | Attending: Cardiovascular Disease | Admitting: Cardiovascular Disease

## 2015-09-19 ENCOUNTER — Encounter (HOSPITAL_COMMUNITY): Payer: Managed Care, Other (non HMO)

## 2015-09-19 ENCOUNTER — Ambulatory Visit
Admission: RE | Admit: 2015-09-19 | Discharge: 2015-09-19 | Disposition: A | Discharge status: Home / Self Care | Payer: Managed Care, Other (non HMO) | Source: Ambulatory Visit | Attending: Vascular Surgery | Admitting: Vascular Surgery

## 2015-09-19 DIAGNOSIS — I517 Cardiomegaly: Secondary | ICD-10-CM | POA: Diagnosis not present

## 2015-09-19 DIAGNOSIS — R001 Bradycardia, unspecified: Secondary | ICD-10-CM | POA: Diagnosis not present

## 2015-09-19 DIAGNOSIS — Z01818 Encounter for other preprocedural examination: Secondary | ICD-10-CM

## 2015-09-19 DIAGNOSIS — R9439 Abnormal result of other cardiovascular function study: Secondary | ICD-10-CM | POA: Insufficient documentation

## 2015-09-19 DIAGNOSIS — I731 Thromboangiitis obliterans [Buerger's disease]: Secondary | ICD-10-CM | POA: Insufficient documentation

## 2015-09-19 DIAGNOSIS — F17200 Nicotine dependence, unspecified, uncomplicated: Secondary | ICD-10-CM | POA: Insufficient documentation

## 2015-09-19 DIAGNOSIS — I739 Peripheral vascular disease, unspecified: Secondary | ICD-10-CM | POA: Diagnosis not present

## 2015-09-19 HISTORY — DX: Anxiety disorder, unspecified: F41.9

## 2015-09-19 LAB — MYOCARDIAL PERFUSION IMAGING
CHL CUP NUCLEAR SRS: 0
CHL CUP NUCLEAR SSS: 0
CSEPPHR: 116 {beats}/min
LV dias vol: 164 mL
LVSYSVOL: 79 mL
Rest HR: 60 {beats}/min
SDS: 0
TID: 1.12

## 2015-09-19 LAB — PROTIME-INR
INR: 0.95 (ref <1.50)
Prothrombin Time: 12.8 seconds (ref 11.6–15.2)

## 2015-09-19 LAB — APTT: APTT: 30 s (ref 24–37)

## 2015-09-19 LAB — PREPARE RBC (CROSSMATCH)

## 2015-09-19 LAB — ABO/RH: ABO/RH(D): A POS

## 2015-09-19 LAB — SURGICAL PCR SCREEN
MRSA, PCR: NEGATIVE
Staphylococcus aureus: NEGATIVE

## 2015-09-19 MED ORDER — REGADENOSON 0.4 MG/5ML IV SOLN
0.4000 mg | Freq: Once | INTRAVENOUS | Status: AC
Start: 2015-09-19 — End: 2015-09-19
  Administered 2015-09-19: 0.4 mg via INTRAVENOUS

## 2015-09-19 MED ORDER — TECHNETIUM TC 99M SESTAMIBI GENERIC - CARDIOLITE
32.3000 | Freq: Once | INTRAVENOUS | Status: AC | PRN
  Administered 2015-09-19: 32.3 via INTRAVENOUS

## 2015-09-19 MED ORDER — TECHNETIUM TC 99M SESTAMIBI GENERIC - CARDIOLITE
10.9000 | Freq: Once | INTRAVENOUS | Status: AC | PRN
  Administered 2015-09-19: 10.9 via INTRAVENOUS

## 2015-09-19 NOTE — Patient Instructions (Signed)
  Your procedure is scheduled on: 09/20/15 Wed @ 6:30  Report to Day Surgery.2nd floor medical mall   Remember: Instructions that are not followed completely may result in serious medical risk, up to and including death, or upon the discretion of your surgeon and anesthesiologist your surgery may need to be rescheduled.    x____ 1. Do not eat food or drink liquids after midnight. No gum chewing or hard candies.     ____ 2. No Alcohol for 24 hours before or after surgery.   ____ 3. Bring all medications with you on the day of surgery if instructed.    __x__ 4. Notify your doctor if there is any change in your medical condition     (cold, fever, infections).     Do not wear jewelry, make-up, hairpins, clips or nail polish.  Do not wear lotions, powders, or perfumes. You may wear deodorant.  Do not shave 48 hours prior to surgery. Men may shave face and neck.  Do not bring valuables to the hospital.    [facility] is not responsible for any belongings or valuables.               Contacts, dentures or bridgework may not be worn into surgery.  Leave your suitcase in the car. After surgery it may be brought to your room.  For patients admitted to the hospital, discharge time is determined by your                treatment team.   Patients discharged the day of surgery will not be allowed to drive home.   Please read over the following fact sheets that you were given:      _x___ Take these medicines the morning of surgery with A SIP OF WATER:    1. OXYCONTIN 30 MG 12 hr tablet  2.   3.   4.  5.  6.  ____ Fleet Enema (as directed)   _x___ Use CHG Soap as directed  ____ Use inhalers on the day of surgery  ____ Stop metformin 2 days prior to surgery    ____ Take 1/2 of usual insulin dose the night before surgery and none on the morning of surgery.   _x___ Stop Coumadin/Plavix/aspirin on stopped Plavix yesterday  ____ Stop Anti-inflammatories on    ____ Stop supplements until  after surgery.    ____ Bring C-Pap to the hospital.

## 2015-09-20 ENCOUNTER — Inpatient Hospital Stay
Admission: RE | Admit: 2015-09-20 | Discharge: 2015-09-29 | DRG: 272 | Disposition: A | Discharge status: Home Health | Payer: Managed Care, Other (non HMO) | Source: Ambulatory Visit | Attending: Vascular Surgery | Admitting: Vascular Surgery

## 2015-09-20 ENCOUNTER — Inpatient Hospital Stay: Payer: Managed Care, Other (non HMO)

## 2015-09-20 ENCOUNTER — Inpatient Hospital Stay: Payer: Managed Care, Other (non HMO) | Admitting: Anesthesiology

## 2015-09-20 ENCOUNTER — Encounter: Payer: Self-pay | Admitting: *Deleted

## 2015-09-20 ENCOUNTER — Encounter
Admission: RE | Disposition: A | Discharge status: Home Health | Payer: Self-pay | Source: Ambulatory Visit | Attending: Vascular Surgery

## 2015-09-20 DIAGNOSIS — Z7902 Long term (current) use of antithrombotics/antiplatelets: Secondary | ICD-10-CM

## 2015-09-20 DIAGNOSIS — F17210 Nicotine dependence, cigarettes, uncomplicated: Secondary | ICD-10-CM | POA: Diagnosis present

## 2015-09-20 DIAGNOSIS — Z79899 Other long term (current) drug therapy: Secondary | ICD-10-CM

## 2015-09-20 DIAGNOSIS — I70262 Atherosclerosis of native arteries of extremities with gangrene, left leg: Principal | ICD-10-CM | POA: Diagnosis present

## 2015-09-20 DIAGNOSIS — Z7982 Long term (current) use of aspirin: Secondary | ICD-10-CM | POA: Diagnosis not present

## 2015-09-20 DIAGNOSIS — Z419 Encounter for procedure for purposes other than remedying health state, unspecified: Secondary | ICD-10-CM

## 2015-09-20 DIAGNOSIS — M62262 Nontraumatic ischemic infarction of muscle, left lower leg: Secondary | ICD-10-CM | POA: Diagnosis present

## 2015-09-20 DIAGNOSIS — L97529 Non-pressure chronic ulcer of other part of left foot with unspecified severity: Secondary | ICD-10-CM | POA: Diagnosis present

## 2015-09-20 DIAGNOSIS — I731 Thromboangiitis obliterans [Buerger's disease]: Secondary | ICD-10-CM | POA: Diagnosis present

## 2015-09-20 HISTORY — PX: BYPASS GRAFT POPLITEAL TO POPLITEAL: SHX5763

## 2015-09-20 HISTORY — PX: ORIF ANKLE FRACTURE: SHX5408

## 2015-09-20 HISTORY — PX: EMBOLECTOMY: SHX44

## 2015-09-20 LAB — GLUCOSE, CAPILLARY: GLUCOSE-CAPILLARY: 129 mg/dL — AB (ref 65–99)

## 2015-09-20 LAB — MAGNESIUM: Magnesium: 1.6 mg/dL — ABNORMAL LOW (ref 1.7–2.4)

## 2015-09-20 LAB — MRSA PCR SCREENING: MRSA by PCR: NEGATIVE

## 2015-09-20 SURGERY — CREATION, BYPASS, ARTERIAL, POPLITEAL
Anesthesia: General | Site: Leg Lower | Laterality: Left | Wound class: Clean

## 2015-09-20 SURGERY — Surgical Case
Anesthesia: *Unknown

## 2015-09-20 MED ORDER — FAMOTIDINE 20 MG PO TABS
ORAL_TABLET | ORAL | Status: AC
  Administered 2015-09-20: 20 mg via ORAL
  Filled 2015-09-20: qty 1

## 2015-09-20 MED ORDER — POLYETHYLENE GLYCOL 3350 17 G PO PACK
17.0000 g | PACK | Freq: Every day | ORAL | Status: DC | PRN
  Administered 2015-09-26: 17 g via ORAL
  Filled 2015-09-20: qty 1

## 2015-09-20 MED ORDER — SODIUM CHLORIDE 0.9 % IJ SOLN
INTRAMUSCULAR | Status: AC
  Filled 2015-09-20: qty 50

## 2015-09-20 MED ORDER — HYDROMORPHONE HCL 1 MG/ML IJ SOLN
INTRAMUSCULAR | Status: AC
  Administered 2015-09-20: 0.5 mg
  Filled 2015-09-20: qty 1

## 2015-09-20 MED ORDER — SUCCINYLCHOLINE CHLORIDE 20 MG/ML IJ SOLN
INTRAMUSCULAR | Status: DC | PRN
  Administered 2015-09-20: 120 mg via INTRAVENOUS

## 2015-09-20 MED ORDER — FENTANYL CITRATE (PF) 100 MCG/2ML IJ SOLN
INTRAMUSCULAR | Status: AC
  Filled 2015-09-20: qty 2

## 2015-09-20 MED ORDER — PAPAVERINE HCL 30 MG/ML IJ SOLN
INTRAMUSCULAR | Status: AC
  Filled 2015-09-20: qty 2

## 2015-09-20 MED ORDER — CEFAZOLIN SODIUM-DEXTROSE 2-3 GM-% IV SOLR
INTRAVENOUS | Status: AC
  Filled 2015-09-20: qty 50

## 2015-09-20 MED ORDER — DOCUSATE SODIUM 100 MG PO CAPS
100.0000 mg | ORAL_CAPSULE | Freq: Every day | ORAL | Status: DC
  Administered 2015-09-21 – 2015-09-29 (×9): 100 mg via ORAL
  Filled 2015-09-20 (×11): qty 1

## 2015-09-20 MED ORDER — PHENOL 1.4 % MT LIQD
1.0000 | OROMUCOSAL | Status: DC | PRN
  Filled 2015-09-20: qty 177

## 2015-09-20 MED ORDER — ACETAMINOPHEN 10 MG/ML IV SOLN
INTRAVENOUS | Status: DC | PRN
  Administered 2015-09-20: 1000 mg via INTRAVENOUS

## 2015-09-20 MED ORDER — LABETALOL HCL 5 MG/ML IV SOLN
10.0000 mg | INTRAVENOUS | Status: DC | PRN
Start: 2015-09-20 — End: 2015-09-29
  Filled 2015-09-20: qty 4

## 2015-09-20 MED ORDER — ASPIRIN EC 81 MG PO TBEC
81.0000 mg | DELAYED_RELEASE_TABLET | Freq: Every day | ORAL | Status: DC
  Administered 2015-09-21 – 2015-09-29 (×9): 81 mg via ORAL
  Filled 2015-09-20 (×10): qty 1

## 2015-09-20 MED ORDER — ACETAMINOPHEN 325 MG RE SUPP: 325.0000 mg | RECTAL | Status: DC | PRN

## 2015-09-20 MED ORDER — LACTATED RINGERS IV SOLN
INTRAVENOUS | Status: DC
  Administered 2015-09-20: 15:00:00 via INTRAVENOUS
  Administered 2015-09-20: 50 mL/h via INTRAVENOUS

## 2015-09-20 MED ORDER — FENTANYL CITRATE (PF) 100 MCG/2ML IJ SOLN
INTRAMUSCULAR | Status: DC | PRN
  Administered 2015-09-20: 100 ug via INTRAVENOUS
  Administered 2015-09-20 (×3): 50 ug via INTRAVENOUS

## 2015-09-20 MED ORDER — CEFAZOLIN SODIUM-DEXTROSE 2-3 GM-% IV SOLR
2.0000 g | INTRAVENOUS | Status: AC
  Administered 2015-09-20 (×3): 2 g via INTRAVENOUS

## 2015-09-20 MED ORDER — KETAMINE HCL 10 MG/ML IJ SOLN
INTRAMUSCULAR | Status: DC | PRN
  Administered 2015-09-20: 40 mg via INTRAVENOUS

## 2015-09-20 MED ORDER — CEFAZOLIN SODIUM 1-5 GM-% IV SOLN
1.0000 g | Freq: Three times a day (TID) | INTRAVENOUS | Status: AC
  Administered 2015-09-20 – 2015-09-21 (×2): 1 g via INTRAVENOUS
  Filled 2015-09-20 (×2): qty 50

## 2015-09-20 MED ORDER — HEPARIN SODIUM (PORCINE) 1000 UNIT/ML IJ SOLN
INTRAMUSCULAR | Status: DC | PRN
  Administered 2015-09-20: 5000 [IU] via INTRAVENOUS

## 2015-09-20 MED ORDER — HYDROMORPHONE HCL 1 MG/ML IJ SOLN
0.5000 mg | INTRAMUSCULAR | Status: DC | PRN
  Administered 2015-09-20 (×6): 0.5 mg via INTRAVENOUS

## 2015-09-20 MED ORDER — DEXAMETHASONE SODIUM PHOSPHATE 4 MG/ML IJ SOLN
INTRAMUSCULAR | Status: DC | PRN
  Administered 2015-09-20: 8 mg via INTRAVENOUS

## 2015-09-20 MED ORDER — ACETAMINOPHEN 10 MG/ML IV SOLN
INTRAVENOUS | Status: AC
  Filled 2015-09-20: qty 100

## 2015-09-20 MED ORDER — HYDROMORPHONE HCL 1 MG/ML IJ SOLN
INTRAMUSCULAR | Status: DC | PRN
  Administered 2015-09-20 (×2): 1 mg via INTRAVENOUS
  Administered 2015-09-20 (×4): 0.5 mg via INTRAVENOUS

## 2015-09-20 MED ORDER — HYDRALAZINE HCL 20 MG/ML IJ SOLN: 5.0000 mg | INTRAMUSCULAR | Status: DC | PRN

## 2015-09-20 MED ORDER — ALUM & MAG HYDROXIDE-SIMETH 200-200-20 MG/5ML PO SUSP
15.0000 mL | ORAL | Status: DC | PRN
Start: 2015-09-20 — End: 2015-09-29

## 2015-09-20 MED ORDER — CLOPIDOGREL BISULFATE 75 MG PO TABS
75.0000 mg | ORAL_TABLET | Freq: Every day | ORAL | Status: DC
  Administered 2015-09-21 – 2015-09-29 (×9): 75 mg via ORAL
  Filled 2015-09-20 (×9): qty 1

## 2015-09-20 MED ORDER — HYDROMORPHONE HCL 1 MG/ML IJ SOLN
INTRAMUSCULAR | Status: AC
  Filled 2015-09-20: qty 1

## 2015-09-20 MED ORDER — PROPOFOL 10 MG/ML IV BOLUS
INTRAVENOUS | Status: DC | PRN
  Administered 2015-09-20: 200 mg via INTRAVENOUS
  Administered 2015-09-20: 100 mg via INTRAVENOUS

## 2015-09-20 MED ORDER — NEOMYCIN-POLYMYXIN B GU 40-200000 IR SOLN
Status: AC
  Filled 2015-09-20: qty 4

## 2015-09-20 MED ORDER — HEPARIN SODIUM (PORCINE) 5000 UNIT/ML IJ SOLN
INTRAMUSCULAR | Status: AC
  Filled 2015-09-20: qty 1

## 2015-09-20 MED ORDER — METOPROLOL TARTRATE 1 MG/ML IV SOLN: 2.0000 mg | INTRAVENOUS | Status: DC | PRN

## 2015-09-20 MED ORDER — ONDANSETRON HCL 4 MG/2ML IJ SOLN: 4.0000 mg | Freq: Four times a day (QID) | INTRAMUSCULAR | Status: DC | PRN

## 2015-09-20 MED ORDER — MAGNESIUM SULFATE 2 GM/50ML IV SOLN
2.0000 g | Freq: Every day | INTRAVENOUS | Status: AC | PRN
Start: 2015-09-20 — End: 2015-09-21
  Administered 2015-09-21: 2 g via INTRAVENOUS
  Filled 2015-09-20: qty 50

## 2015-09-20 MED ORDER — EVICEL 5 ML EX KIT
PACK | CUTANEOUS | Status: DC | PRN
  Administered 2015-09-20: 5 mL

## 2015-09-20 MED ORDER — GLYCOPYRROLATE 0.2 MG/ML IJ SOLN
INTRAMUSCULAR | Status: DC | PRN
  Administered 2015-09-20: 0.2 mg via INTRAVENOUS

## 2015-09-20 MED ORDER — POTASSIUM CHLORIDE CRYS ER 20 MEQ PO TBCR: 20.0000 meq | EXTENDED_RELEASE_TABLET | Freq: Every day | ORAL | Status: DC | PRN

## 2015-09-20 MED ORDER — PANTOPRAZOLE SODIUM 40 MG IV SOLR
40.0000 mg | INTRAVENOUS | Status: DC
  Administered 2015-09-21 – 2015-09-22 (×3): 40 mg via INTRAVENOUS
  Filled 2015-09-20 (×3): qty 40

## 2015-09-20 MED ORDER — MIDAZOLAM HCL 2 MG/2ML IJ SOLN
INTRAMUSCULAR | Status: DC | PRN
  Administered 2015-09-20: 2 mg via INTRAVENOUS

## 2015-09-20 MED ORDER — HYDROMORPHONE HCL 1 MG/ML IJ SOLN
1.0000 mg | INTRAMUSCULAR | Status: DC | PRN
  Administered 2015-09-20 – 2015-09-22 (×10): 1 mg via INTRAVENOUS
  Filled 2015-09-20 (×12): qty 1

## 2015-09-20 MED ORDER — SORBITOL 70 % SOLN
30.0000 mL | Freq: Every day | Status: DC | PRN
  Administered 2015-09-27: 30 mL via ORAL
  Filled 2015-09-20 (×2): qty 30

## 2015-09-20 MED ORDER — ACETAMINOPHEN 325 MG PO TABS: 325.0000 mg | ORAL_TABLET | ORAL | Status: DC | PRN

## 2015-09-20 MED ORDER — ONDANSETRON HCL 4 MG/2ML IJ SOLN
INTRAMUSCULAR | Status: DC | PRN
  Administered 2015-09-20: 4 mg via INTRAVENOUS

## 2015-09-20 MED ORDER — FAMOTIDINE 20 MG PO TABS
20.0000 mg | ORAL_TABLET | Freq: Once | ORAL | Status: AC
  Administered 2015-09-20: 20 mg via ORAL

## 2015-09-20 MED ORDER — HYDROMORPHONE HCL 1 MG/ML IJ SOLN
INTRAMUSCULAR | Status: AC
  Administered 2015-09-20: 1 mg
  Filled 2015-09-20: qty 1

## 2015-09-20 MED ORDER — GUAIFENESIN-DM 100-10 MG/5ML PO SYRP: 15.0000 mL | ORAL_SOLUTION | ORAL | Status: DC | PRN

## 2015-09-20 MED ORDER — HYDROMORPHONE HCL 1 MG/ML IJ SOLN
INTRAMUSCULAR | Status: AC
  Filled 2015-09-20: qty 2

## 2015-09-20 MED ORDER — SODIUM CHLORIDE 0.9 % IV SOLN
INTRAVENOUS | Status: DC | PRN
  Administered 2015-09-20: 1 mL via INTRAMUSCULAR

## 2015-09-20 MED ORDER — ONDANSETRON HCL 4 MG/2ML IJ SOLN: 4.0000 mg | Freq: Once | INTRAMUSCULAR | Status: DC | PRN

## 2015-09-20 MED ORDER — HYDROMORPHONE HCL 1 MG/ML IJ SOLN
0.2500 mg | INTRAMUSCULAR | Status: AC | PRN
  Administered 2015-09-20 (×8): 0.5 mg via INTRAVENOUS

## 2015-09-20 MED ORDER — FLEET ENEMA 7-19 GM/118ML RE ENEM
1.0000 | ENEMA | Freq: Once | RECTAL | Status: AC | PRN
  Administered 2015-09-28: 1 via RECTAL

## 2015-09-20 MED ORDER — ROCURONIUM BROMIDE 100 MG/10ML IV SOLN
INTRAVENOUS | Status: DC | PRN
  Administered 2015-09-20: 5 mg via INTRAVENOUS

## 2015-09-20 MED ORDER — LIDOCAINE HCL (CARDIAC) 20 MG/ML IV SOLN
INTRAVENOUS | Status: DC | PRN
  Administered 2015-09-20: 100 mg via INTRAVENOUS

## 2015-09-20 MED ORDER — HEPARIN SODIUM (PORCINE) 10000 UNIT/ML IJ SOLN
INTRAMUSCULAR | Status: AC
  Filled 2015-09-20: qty 1

## 2015-09-20 MED ORDER — NEOMYCIN-POLYMYXIN B GU 40-200000 IR SOLN
Status: DC | PRN
  Administered 2015-09-20: 2 mL

## 2015-09-20 MED ORDER — OXYCODONE HCL 5 MG PO TABS
20.0000 mg | ORAL_TABLET | ORAL | Status: DC | PRN
  Administered 2015-09-21 – 2015-09-22 (×7): 20 mg via ORAL
  Filled 2015-09-20 (×7): qty 4

## 2015-09-20 SURGICAL SUPPLY — 122 items
.062 KWIRE ×4 IMPLANT
2.8 MM FIBULA ROD DRILL ×4 IMPLANT
APPLIER CLIP 11 MED OPEN (CLIP) ×5
APPLIER CLIP 9.375 SM OPEN (CLIP) ×5
BAG COUNTER SPONGE EZ (MISCELLANEOUS) IMPLANT
BAG DECANTER FOR FLEXI CONT (MISCELLANEOUS) ×5 IMPLANT
BAG ISOLATATION DRAPE 20X20 ST (DRAPES) ×3 IMPLANT
BANDAGE ACE 4X5 VEL STRL LF (GAUZE/BANDAGES/DRESSINGS) IMPLANT
BIT DRILL 2.8 FIBULA F/ROD (BIT) ×4
BIT DRILL CANN FIBULA NONSTRL (BIT) ×5
BLADE MED AGGRESSIVE (BLADE) ×5 IMPLANT
BLADE SURG 15 STRL LF DISP TIS (BLADE) ×6 IMPLANT
BLADE SURG 15 STRL SS (BLADE) ×4
BLADE SURG SZ10 CARB STEEL (BLADE) ×10 IMPLANT
BLADE SURG SZ11 CARB STEEL (BLADE) ×5 IMPLANT
BNDG ESMARK 4X12 TAN STRL LF (GAUZE/BANDAGES/DRESSINGS) IMPLANT
BOOT SUTURE AID YELLOW STND (SUTURE) ×5 IMPLANT
BRUSH SCRUB 4% CHG (MISCELLANEOUS) ×5 IMPLANT
CANISTER SUCT 1200ML W/VALVE (MISCELLANEOUS) ×10 IMPLANT
CATH EMBOLECTOMY 3X80 (CATHETERS) ×5 IMPLANT
CATH TRAY 16F METER LATEX (MISCELLANEOUS) ×5 IMPLANT
CHLORAPREP W/TINT 26ML (MISCELLANEOUS) IMPLANT
CLIP APPLIE 11 MED OPEN (CLIP) ×3 IMPLANT
CLIP APPLIE 9.375 SM OPEN (CLIP) ×3 IMPLANT
COUNTER SPONGE BAG EZ (MISCELLANEOUS)
COVER PROBE ULTRASOUND XLONG L (MISCELLANEOUS) ×5 IMPLANT
DRAPE FLUOR MINI C-ARM 54X84 (DRAPES) ×5 IMPLANT
DRAPE INCISE IOBAN 66X45 STRL (DRAPES) ×15 IMPLANT
DRAPE ISOLATE BAG 20X20 STRL (DRAPES) ×2
DRAPE SHEET LG 3/4 BI-LAMINATE (DRAPES) ×5 IMPLANT
DRAPE U-SHAPE 47X51 STRL (DRAPES) ×5 IMPLANT
DRESSING SURGICEL FIBRLLR 1X2 (HEMOSTASIS) ×6 IMPLANT
DRILL CANN 6.1MM INTRA FIB NS (BIT) ×3 IMPLANT
DRILL SURG 2.8 FIBULA F/ROD (BIT) ×3 IMPLANT
DRSG EMULSION OIL 3X8 NADH (GAUZE/BANDAGES/DRESSINGS) IMPLANT
DRSG SURGICEL FIBRILLAR 1X2 (HEMOSTASIS) ×10
DRSG TEGADERM 4X10 (GAUZE/BANDAGES/DRESSINGS) IMPLANT
DRSG TEGADERM 4X4.75 (GAUZE/BANDAGES/DRESSINGS) IMPLANT
DRSG TELFA 3X8 NADH (GAUZE/BANDAGES/DRESSINGS) IMPLANT
DRSG VAC ATS LRG SENSATRAC (GAUZE/BANDAGES/DRESSINGS) ×5 IMPLANT
DURAPREP 26ML APPLICATOR (WOUND CARE) ×10 IMPLANT
ELECT CAUTERY BLADE 6.4 (BLADE) ×10 IMPLANT
EVICEL 5ML SEALNT HUMAN B (Miscellaneous) ×5 IMPLANT
EVICEL AIRLESS SPRAY ACCES (MISCELLANEOUS) ×5 IMPLANT
FIBULA ROD INTRAMEDULLARY DRILL ×4 IMPLANT
GAUZE PETRO XEROFOAM 1X8 (MISCELLANEOUS) IMPLANT
GAUZE SPONGE 4X4 12PLY STRL (GAUZE/BANDAGES/DRESSINGS) IMPLANT
GEL ULTRASOUND 20GR AQUASONIC (MISCELLANEOUS) IMPLANT
GLOVE BIOGEL PI IND STRL 9 (GLOVE) ×3 IMPLANT
GLOVE BIOGEL PI INDICATOR 9 (GLOVE) ×2
GLOVE INDICATOR 7.5 STRL GRN (GLOVE) ×15 IMPLANT
GLOVE SURG ORTHO 9.0 STRL STRW (GLOVE) ×5 IMPLANT
GLOVE SURG SYN 8.0 (GLOVE) ×5 IMPLANT
GOWN SPECIALTY ULTRA XL (MISCELLANEOUS) ×5 IMPLANT
GOWN STRL REUS W/ TWL LRG LVL3 (GOWN DISPOSABLE) ×6 IMPLANT
GOWN STRL REUS W/ TWL XL LVL3 (GOWN DISPOSABLE) ×6 IMPLANT
GOWN STRL REUS W/TWL LRG LVL3 (GOWN DISPOSABLE) ×4
GOWN STRL REUS W/TWL XL LVL3 (GOWN DISPOSABLE) ×4
HEMOSTAT SURGICEL 2X3 (HEMOSTASIS) ×5 IMPLANT
HEMOVAC 400ML (MISCELLANEOUS)
IV NS 500ML (IV SOLUTION) ×2
IV NS 500ML BAXH (IV SOLUTION) ×3 IMPLANT
KIT DRAIN HEMOVAC JP 7FR 400ML (MISCELLANEOUS) IMPLANT
KIT RM TURNOVER STRD PROC AR (KITS) ×10 IMPLANT
LABEL OR SOLS (LABEL) ×10 IMPLANT
LIQUID BAND (GAUZE/BANDAGES/DRESSINGS) IMPLANT
LOOP RED MAXI  1X406MM (MISCELLANEOUS) ×4
LOOP VESSEL MAXI 1X406 RED (MISCELLANEOUS) ×6 IMPLANT
LOOP VESSEL MINI 0.8X406 BLUE (MISCELLANEOUS) ×6 IMPLANT
LOOPS BLUE MINI 0.8X406MM (MISCELLANEOUS) ×4
NEEDLE FILTER BLUNT 18X 1/2SAF (NEEDLE) ×2
NEEDLE FILTER BLUNT 18X1 1/2 (NEEDLE) ×3 IMPLANT
NEEDLE HYPO 25X1 1.5 SAFETY (NEEDLE) ×5 IMPLANT
NS IRRIG 1000ML POUR BTL (IV SOLUTION) ×10 IMPLANT
PACK BASIN MAJOR ARMC (MISCELLANEOUS) ×5 IMPLANT
PACK EXTREMITY ARMC (MISCELLANEOUS) ×5 IMPLANT
PACK UNIVERSAL (MISCELLANEOUS) ×5 IMPLANT
PAD ABD DERMACEA PRESS 5X9 (GAUZE/BANDAGES/DRESSINGS) IMPLANT
PAD CAST CTTN 4X4 STRL (SOFTGOODS) IMPLANT
PAD GROUND ADULT SPLIT (MISCELLANEOUS) ×10 IMPLANT
PAD PREP 24X41 OB/GYN DISP (PERSONAL CARE ITEMS) IMPLANT
PADDING CAST COTTON 4X4 STRL (SOFTGOODS)
PENCIL ELECTRO HAND CTR (MISCELLANEOUS) IMPLANT
ROD 3.0 X 180MM SM BONE (Nail) ×10 IMPLANT
SCREW CORTICAL 3.5X22 NONLOCK (Screw) ×8 IMPLANT
SPLINT CAST 1 STEP 5X30 WHT (MISCELLANEOUS) IMPLANT
SPONGE LAP 18X18 5 PK (GAUZE/BANDAGES/DRESSINGS) ×20 IMPLANT
SPONGE XRAY 4X4 16PLY STRL (MISCELLANEOUS) IMPLANT
STAPLER SKIN PROX 35W (STAPLE) ×10 IMPLANT
STOCKINETTE STRL 6IN 960660 (GAUZE/BANDAGES/DRESSINGS) IMPLANT
SUT ETHILON 3-0 FS-10 30 BLK (SUTURE)
SUT ETHILON 3-0 KS 30 BLK (SUTURE) ×5 IMPLANT
SUT MNCRL AB 4-0 PS2 18 (SUTURE) IMPLANT
SUT MNCRL+ 5-0 UNDYED PC-3 (SUTURE) ×3 IMPLANT
SUT MONOCRYL 5-0 (SUTURE) ×2
SUT PROLENE 3 0 SH DA (SUTURE) ×5 IMPLANT
SUT PROLENE 5 0 RB 1 DA (SUTURE) ×20 IMPLANT
SUT PROLENE 6 0 BV (SUTURE) ×40 IMPLANT
SUT PROLENE 6 0 C 1 30 (SUTURE) ×10 IMPLANT
SUT PROLENE 7 0 BV 1 (SUTURE) ×20 IMPLANT
SUT SILK 2 0 (SUTURE) ×2
SUT SILK 2 0 SH (SUTURE) ×5 IMPLANT
SUT SILK 2-0 18XBRD TIE 12 (SUTURE) ×3 IMPLANT
SUT SILK 3 0 (SUTURE) ×6
SUT SILK 3-0 18XBRD TIE 12 (SUTURE) ×9 IMPLANT
SUT SILK 4 0 (SUTURE) ×2
SUT SILK 4-0 18XBRD TIE 12 (SUTURE) ×3 IMPLANT
SUT VIC AB 0 CT1 36 (SUTURE) IMPLANT
SUT VIC AB 2-0 CT1 (SUTURE) ×5 IMPLANT
SUT VIC AB 2-0 SH 27 (SUTURE) ×4
SUT VIC AB 2-0 SH 27XBRD (SUTURE) ×6 IMPLANT
SUT VIC AB 3-0 SH 27 (SUTURE) ×6
SUT VIC AB 3-0 SH 27X BRD (SUTURE) ×9 IMPLANT
SUT VICRYL+ 3-0 36IN CT-1 (SUTURE) ×10 IMPLANT
SUTURE EHLN 3-0 FS-10 30 BLK (SUTURE) IMPLANT
SYR 20CC LL (SYRINGE) ×5 IMPLANT
SYR 3ML LL SCALE MARK (SYRINGE) ×5 IMPLANT
SYR TB 1ML 27GX1/2 LL (SYRINGE) ×5 IMPLANT
SYRINGE 10CC LL (SYRINGE) ×5 IMPLANT
TAPE UMBIL 1/8X18 RADIOPA (MISCELLANEOUS) ×5 IMPLANT
TOWEL OR 17X26 4PK STRL BLUE (TOWEL DISPOSABLE) ×10 IMPLANT
WND VAC CANISTER 500ML (MISCELLANEOUS) ×5 IMPLANT

## 2015-09-20 NOTE — Transfer of Care (Signed)
Immediate Anesthesia Transfer of Care Note  Patient: Harold [name]  Procedure(s) Performed: Procedure(s): BYPASS GRAFT POPLITEAL TO PERONEAL (Left) OPEN REDUCTION INTERNAL FIXATION (ORIF) ANKLE FRACTURE (Left) EMBOLECTOMY (Left)  Patient Location: PACU  Anesthesia Type:General  Level of Consciousness: awake and patient cooperative  Airway & Oxygen Therapy: Patient Spontanous Breathing and Patient connected to face mask oxygen  Post-op Assessment: Report given to RN  Post vital signs: Reviewed and stable  Last Vitals:  Filed Vitals:   09/20/15 0616 09/20/15 1633  BP: 152/119 133/97  Pulse: 79 115  Temp: 36.4 C 36.3 C  Resp: 16 20    Complications: No apparent anesthesia complications

## 2015-09-20 NOTE — H&P (Signed)
[facility] History & Physical Update  The patient was interviewed and re-examined.  The patient's previous History and Physical has been reviewed and is unchanged.  There is no change in the plan of care. We plan to proceed with the scheduled procedure.  Schnier, [name], MD  09/20/2015, 5:08 PM

## 2015-09-20 NOTE — OR Nursing (Signed)
5000 units Heparin given by anesthesia at 1322

## 2015-09-20 NOTE — Anesthesia Postprocedure Evaluation (Signed)
Anesthesia Post Note  Patient: Harold [name]  Procedure(s) Performed: Procedure(s) (LRB): BYPASS GRAFT POPLITEAL TO PERONEAL (Left) OPEN REDUCTION INTERNAL FIXATION (ORIF) ANKLE FRACTURE (Left) EMBOLECTOMY (Left)  Patient location during evaluation: PACU Anesthesia Type: General Level of consciousness: awake and alert Pain management: pain level controlled Vital Signs Assessment: post-procedure vital signs reviewed and stable Respiratory status: spontaneous breathing, nonlabored ventilation, respiratory function stable and patient connected to nasal cannula oxygen Cardiovascular status: blood pressure returned to baseline and stable Postop Assessment: no signs of nausea or vomiting Anesthetic complications: no    Last Vitals:  Filed Vitals:   09/20/15 1833 09/20/15 1912  BP: 122/76   Pulse: 89   Temp:  36.8 C  Resp:      Last Pain:  Filed Vitals:   09/20/15 1917  PainSc: [name]                 [name] [name]

## 2015-09-20 NOTE — OR Nursing (Signed)
Patient has blackened big toe on left foot with some slices on left outer edge of foot.  Swelling of left ankle noted.  Patient has pain and is unable to locate comfortable position.  Scratches noted over left leg.  Both Dr. Rosita KeaMenz and Dr. Gilda CreaseSchnier are aware.  Dr. Gilda CreaseSchnier discussed a 5 day stay with patient and wife and probable use of a wound vac. Questions answered regarding surgery and after.

## 2015-09-20 NOTE — Op Note (Signed)
OPERATIVE NOTE   PROCEDURE: 1.  Left popliteal to peroneal bypass using reversed great saphenous vein 2.  Thrombectomy left popliteal artery using a Fogarty catheter  PRE-OPERATIVE DIAGNOSIS: Ischemia left lower extremity; Buerger's disease  POST-OPERATIVE DIAGNOSIS: Ischemia left lower extremity; Buerger's disease; thrombus within the left popliteal artery  SURGEON: Renford Dills, M.D. ASSISTANT(S): Festus Barren M.D. first Asst.  ANESTHESIA: general  ESTIMATED BLOOD LOSS: 500 cc  FINDING(S): 1.  Peroneal artery identified from the lateral approach was less than 1 mm and therefore exploration was undertaken moving more proximally until an adequate target site was found. Also thrombus was noted within the popliteal artery which was not present on the angiogram from last month  SPECIMEN(S):  Thrombus from the popliteal artery  INDICATIONS:   Harold [name] is a 34 y.o. male who presents with ischemia of the left lower extremity with ulceration of the great toe. He has been having a dramatic increase in his pain over the past week or 2. He did undergo angiography last month but was found not to be an interventional candidate. Angiogram did identify what appeared to be a normal sized peroneal artery which would best be approached from the lateral standpoint. The risks and benefits of bypass surgery were reviewed all questions answered patient has agreed to proceed..  DESCRIPTION: After obtaining full informed written consent, the patient was brought back to the operating room and placed supine upon the operating table.  The patient received IV antibiotics prior to induction.  After obtaining adequate anesthesia, the patient was prepped and draped in the standard fashion appropriate time out is called.    Dr. Rosita Kea then prepared the fibula for rodding post bypass this was without incident and a subsequent scrubbed out and will scrub back in later to complete fixation of the  fibula.  Attention was then turned to the medial aspect of the left leg and a second timeout was called. Ultrasound was then utilized to localize the saphenous vein and once this was identified beginning at the knee level the saphenous vein was exposed and dissected circumferentially from the level of the knee down to the medial malleolus. Side branches were ligated and divided between 30 or 2-0 silk ties. The vein was freed from all attachments and subsequently after verifying adequate length it was ligated and divided proximally and distally using 2-0 silk ties for hemostasis. It was then irrigated with heparinized saline and dilated with a 3 mm Garrett coronary dilator and then irrigated with papaverine. Was placed in a small cup with the papaverine solution mixed with some saline and attention was returned to the patient's left leg.  Through the saphenous vein harvest incision the popliteal space was entered and the dissection carried deeply to identify the popliteal vein which was reflected posteriorly popliteal artery was then tacked dissected circumferentially for a distance of approximately 3 cm. It was noted to be strongly pulsatile by palpation.  Attention was then turned to the lateral aspect the patient's leg was straightened and the fibula palpated the lateral malleolus identified and beginning approximately 1 fingerbreadth above the lateral malleolus a linear incision was created and carried down through the skin and soft tissues to expose the fibula. Fibula was dissected free of the surrounding tissues circumferentially using a Therapist, nutritional as well as a [employer]. Subsequently an oscillating saw was used to transect the fibula removing approximately 10 centimeters of bone. The rest her muscle belly was then pushed laterally exposing the neurovascular bundle. Outward appearance was  quite normal with normal sized veins. However upon dissecting the veins free from the artery identified the  peroneal artery which was measuring less than 1 mm based on approximation with a Garrett coronary dilator. This does not appear to be acceptable for a bypass based on size. It was interrogated with a Doppler and found to have biphasic flow.  I then decided that I would extend the popliteal incision down along the arteries identifying the posterior tibial and the peroneal and then subsequently dissecting the peroneal down as distally as I could from the medial approach to see if I could identify an adequate target. For reasons that I am uncertain the patient had densely inflammatory response around all arteries at all locations and this dissection was quite meticulous. I also believe this inflammatory response or condition has a direct relationship with the very small size of his tibial arteries. I did identify the posterior tibial several centimeters below its origin and did 2 was less than 2 mm in diameter interrogation with a Doppler did not yield any significant signal and therefore I proceeded more deeply with the dissection to identify the peroneal. This was done without jugular difficulty however following the peroneal more distally as noted above was quite difficult but ultimately at approximately the level of the upper margin of the fibula resection I did identify an area that appeared to be at least 2 mm in diameter. By extending the dissection of the peroneal this entire length I also verified that the vessel identified through the lateral approach is indeed the peroneal and it is indeed contiguous with the more proximal peroneal.  Given the fairly small size of the peroneal before making any further progress proximally I decided to open the artery to ensure a would be able to create an adequate arteriotomy and 2 try to verify that patency was still present. Vesseloops were used to control the peroneal and 11 blade scalpel was used to make an arteriotomy which was then extended with the Potts scissors.  The inner luminal surface of the artery appeared normal there was good backbleeding and a 2 mm Garrett coronary dilator did pass its full length. The artery was then irrigated with heparinized saline and clamped proximally and distally.  Attention was then turned to the popliteal artery which are been looped angled Fogarty vascular clamp was then used to control it proximally and an arteriotomy was made with 11 blade scalpel was extended with Potts scissors. Upon opening the artery thrombus was encountered. Using a Therapist, nutritionalreer elevator I removed a significant portion but noticed there was still thrombus both at the upper and lower aspects of the arteriotomy. A #3 Fogarty was then opened onto the field and prepped and used to achieve adequate thrombectomy. Proximally it was advanced to the full length of the catheter approximate 4 passes were made thrombus was returned on the first to. Was then advanced distally and several passes were made small amount of thrombus was returned and good backbleeding was restored. There was excellent forward bleeding now 2. Heparinized saline was then used to irrigate the popliteal artery proximally and distally and the artery was re-controlled with Silastic Vesseloops and the Fogarty catheter vascular clamp. 6-0 Prolene stay sutures were then placed the vein was delivered back onto the field it was reversed orientation was verified as I had used a 6-0 Prolene to mark the distal end of the vein. The vein was spatulated and then sewn to the popliteal artery and and vein to side  popliteal artery fashion using running 6-0 Prolene. 2 areas were controlled with interrupted 6-0 Prolene's and flow was reestablished distally into the anterior tibial. The vein was then inspected several small branches were ligated with 6-0 Prolene. The leg was straightened and the vein was then approximated to the peroneal artery.  The vein was then transected the leg repositioned into a flexed manner vein was  spatulated and then sewn to the peroneal artery in an end vein to side peroneal fashion flushing maneuvers were performed excellent backbleeding was noted and subsequently the suture line was completed and flow was established to the foot. The foot which was a dusky cyanotic appearance prior to the bypass now began to pink up quite nicely and by the end of the case was bright pink with brisk capillary refill area  The medial wound was then packed with saline moistened laparotomy pads and Dr. Lynnea Maizes was asked to return to the operating room.  The portion of fibular fixation will be dictated by Dr. Rosita Kea. It went quite well without any difficulty and he subsequent scrubbed out area  The medial wound was then inspected for hemostasis several areas were treated with 3-0 Vicryl suture ligation and/or Bovie cautery was then irrigated with a liter saline fibula are and AdvaSeal was then placed in the bed of the wound and subsequently the wound was closed using interrupted 3-0 Vicryl for the subcutaneous tissues followed by a combination of 3-0 nylon vertical mattress sutures and surgical staples for skin. Attention was then turned to the lateral incision the 2 small incisions created by Dr. Lynnea Maizes for fixation were closed with staples the lateral incision was closed with interrupted nylons in combination with surgical staples. The leg was then cleansed with sterile saline and fact dressings were applied over both medial and lateral incisions.  The patient tolerated the procedure without any hemodynamic instability and was subsequently taken to the recovery room in stable condition  As noted above by the conclusion of the case the patient's foot had become quite pink with brisk capillary refill a marked improvement compared to the preop dusky cyanosis of the entire forefoot and toes.  COMPLICATIONS: Small distal artery; thrombus within the popliteal artery  CONDITION: Stable  Renford Dills,  M.D. [facility] Office: [phone]   09/20/2015, 5:11 PM

## 2015-09-20 NOTE — Anesthesia Procedure Notes (Signed)
Procedure Name: Intubation Date/Time: 09/20/2015 7:54 AM Performed by: Ginger CarneMICHELET, Avram Danielson Pre-anesthesia Checklist: Patient identified, Emergency Drugs available, Suction available, Patient being monitored and Timeout performed Patient Re-evaluated:Patient Re-evaluated prior to inductionOxygen Delivery Method: Circle system utilized Preoxygenation: Pre-oxygenation with 100% oxygen Intubation Type: IV induction Ventilation: Mask ventilation without difficulty Laryngoscope Size: Miller and 2 Grade View: Grade I Tube type: Oral Tube size: 7.5 mm Number of attempts: 1 Airway Equipment and Method: Stylet Placement Confirmation: ETT inserted through vocal cords under direct vision and positive ETCO2 Secured at: 22 cm Tube secured with: Tape Dental Injury: Teeth and Oropharynx as per pre-operative assessment

## 2015-09-20 NOTE — H&P (Signed)
Reviewed paper H+P, will be scanned into chart. No changes noted.  

## 2015-09-20 NOTE — Anesthesia Preprocedure Evaluation (Signed)
Anesthesia Evaluation  Patient identified by MRN, date of birth, ID band Patient awake    Reviewed: Allergy & Precautions, NPO status , Patient's Chart, lab work & pertinent test results  Airway Mallampati: I  TM Distance: >3 FB Neck ROM: Full Positive for:  Tracheal deviation   Dental   Pulmonary Current Smoker,    Pulmonary exam normal        Cardiovascular Exercise Tolerance: Good + Peripheral Vascular Disease  Normal cardiovascular exam     Neuro/Psych    GI/Hepatic   Endo/Other    Renal/GU      Musculoskeletal   Abdominal Normal abdominal exam  (+)   Peds  Hematology   Anesthesia Other Findings   Reproductive/Obstetrics                             Anesthesia Physical Anesthesia Plan  ASA: III  Anesthesia Plan: General   Post-op Pain Management:    Induction: Intravenous  Airway Management Planned: Oral ETT  Additional Equipment:   Intra-op Plan:   Post-operative Plan: Extubation in OR  Informed Consent: I have reviewed the patients History and Physical, chart, labs and discussed the procedure including the risks, benefits and alternatives for the proposed anesthesia with the patient or authorized representative who has indicated his/her understanding and acceptance.     Plan Discussed with: CRNA  Anesthesia Plan Comments: (Buerger's ds--thrombo-angiitis obliterans, smoker.)        Anesthesia Quick Evaluation

## 2015-09-20 NOTE — OR Nursing (Signed)
Patient resting quietly at present, but patient was given 6 mg of dilaudid in about one hour before he got relief from pain

## 2015-09-20 NOTE — Op Note (Signed)
09/20/2015  3:06 PM  PATIENT:  Melvyn NethJoshua Gude  34 y.o. male  PRE-OPERATIVE DIAGNOSIS:  ASO WITH ULCERATION  POST-OPERATIVE DIAGNOSIS:  ASO WITH ULCERATION  PROCEDURE:  Procedure(s): BYPASS GRAFT POPLITEAL TO POPLITEAL (Left) OPEN REDUCTION INTERNAL FIXATION (ORIF) ANKLE FRACTURE (Left) EMBOLECTOMY (Left)  SURGEON: Leitha SchullerMichael J Grisel Blumenstock, MD  ASSISTANTS: None, case done with Dr. Lorretta HarpSchneir and Dr. Wyn Quakerew addressing vasculature and myself fixing fibula  ANESTHESIA:   general  EBL:  Total I/O In: 2000 [I.V.:2000] Out: 1225 [Urine:725; Blood:500]  BLOOD ADMINISTERED:none  DRAINS: none   LOCAL MEDICATIONS USED:  NONE  SPECIMEN:  No Specimen  DISPOSITION OF SPECIMEN:  N/A  COUNTS:  YES  TOURNIQUET:  * No tourniquets in log *  IMPLANTS: Acumed small bone intramedullary rod 3 mm x 180 mm fibular rod with 22 mm cortical screw  DICTATION: .Dragon Dictation patient was seen at the start of the case after patient identification timeout procedures and having the leg wrapped and draped in the sterile fashion a small incision was made at the distal fibula and a guide were inserted into the distal fibula centered on AP and lateral images. This is overdrilled and hand reaming carried out with a T-handle reamer and the 3 mm reamer was appropriate-sized the larger reamer would've been too tight to pass 23 mm rod was subsequently chosen. Dr. Lorretta HarpSchneir performed a partial fibular ostectomy and the fibula bone was saved in saline for attempt at bypass on the lateral side. This was unsuccessful and he did go medial after that was completed and I was called back in and the fibular bone was debrided of any muscular tissue which was devascularized at this point and placed back in its appropriate position laterally a 3 mm rod was inserted up through the distal fibula crossing both sides of the osteotomy and up into the proximal fibular shaft. 180 mm rod was chosen and this filled the canal appropriately. When it was at  the appropriate depth distally where was buried under the distal fibula , a small incision was made anteriorly for a single interlocking screw to prevent rotation or backing out of the rod this was drilled and then the 22 mm screw inserted not going to the posterior cortex to prevent peroneal tendon impingement. The insertion handle was removed at this point and the wound closed with staples. Permanent C-arm views obtained  PLAN OF CARE: Admit to inpatient   PATIENT DISPOSITION:  PACU - hemodynamically stable.

## 2015-09-21 LAB — BASIC METABOLIC PANEL
Anion gap: 6 (ref 5–15)
BUN: 11 mg/dL (ref 6–20)
CHLORIDE: 104 mmol/L (ref 101–111)
CO2: 30 mmol/L (ref 22–32)
Calcium: 8.5 mg/dL — ABNORMAL LOW (ref 8.9–10.3)
Creatinine, Ser: 0.84 mg/dL (ref 0.61–1.24)
GFR calc non Af Amer: >60 mL/min (ref >60)
Glucose, Bld: 108 mg/dL — ABNORMAL HIGH (ref 65–99)
POTASSIUM: 4.4 mmol/L (ref 3.5–5.1)
Sodium: 140 mmol/L (ref 135–145)

## 2015-09-21 LAB — CBC
HEMATOCRIT: 36.2 % — AB (ref 40.0–52.0)
HEMOGLOBIN: 11.9 g/dL — AB (ref 13.0–18.0)
MCH: 29.3 pg (ref 26.0–34.0)
MCHC: 32.8 g/dL (ref 32.0–36.0)
MCV: 89.3 fL (ref 80.0–100.0)
Platelets: 241 10*3/uL (ref 150–440)
RBC: 4.06 MIL/uL — ABNORMAL LOW (ref 4.40–5.90)
RDW: 15.1 % — ABNORMAL HIGH (ref 11.5–14.5)
WBC: 12.2 10*3/uL — AB (ref 3.8–10.6)

## 2015-09-21 LAB — TYPE AND SCREEN
ABO/RH(D): A POS
ANTIBODY SCREEN: NEGATIVE
Unit division: 0
Unit division: 0

## 2015-09-21 LAB — TROPONIN I: Troponin I: <0.03 ng/mL (ref <0.031)

## 2015-09-21 LAB — MAGNESIUM: MAGNESIUM: 2.1 mg/dL (ref 1.7–2.4)

## 2015-09-21 NOTE — Progress Notes (Signed)
Pt reported new onset of chest tightness, described as aching tightness in the center of his chest. Rates the pain at 5 out of 10. HR is elevated from earlier today now in low 100's. BP is up as well 143/85. Not appearing in respiratory distress. Pt O2 sats are still at 100%  Dr. Kirke CorinArida paged.  12:55 Dr. Kirke CorinArida aware in change in pt. New orders given will continue to follow.

## 2015-09-21 NOTE — Care Management (Signed)
Patient status post BYPASS GRAFT POPLITEAL TO PERONEAL (Left) OPEN REDUCTION INTERNAL FIXATION (ORIF) ANKLE FRACTURE (Left) EMBOLECTOMY (Left)  During rounds RNCM notified that patient has concerns of being able to get into his 2nd story apartment due to limited mobility, and concerns of not having a PCP.  Patient's wife states that there are approximately 15 steps to get to the apartment. I informed the patient that PT consult would be ordered.   They will progress his mobility and evaluate his ability to get up steps.  Patient's wife is to check with PCP offices local to their home to find one that is accepting new patients, as patient has insurance coverage. RNCM to follow for discharge planning .

## 2015-09-21 NOTE — Progress Notes (Signed)
[facility]  Daily Progress Note   Subjective  - 1 Day Post-Op  Patient is in bed he is still having significant 9-10 out of 10 leg pain he notes that this is a different quality compared to preop. He commented he can actually feel his foot now and that the pain is mostly in the calf area. He has been using IV but not the oral pain medication  Objective Filed Vitals:   09/21/15 0428 09/21/15 0500 09/21/15 0600 09/21/15 0700  BP:  136/85 137/85   Pulse: 57 50 52   Temp:   98.1 F (36.7 C) 97.7 F (36.5 C)  TempSrc:   Oral Oral  Resp: 13 14 14    Height:      Weight:      SpO2: 99% 100% 100%     Intake/Output Summary (Last 24 hours) at 09/21/15 0806 Last data filed at 09/21/15 0600  Gross per 24 hour  Intake   3300 ml  Output   3195 ml  Net    105 ml    PULM  Normal effort , no use of accessory muscles CV  No JVD, RRR Abd      No distended, nontender VASC  left foot is hyperemic with brisk capillary refill. There is 3+ edema. VAC dressings are intact  Laboratory CBC    Component Value Date/Time   WBC 7.7 09/18/2015 1601   HGB 13.9 09/18/2015 1601   HCT 41.3 09/18/2015 1601   PLT 273 09/18/2015 1601    BMET    Component Value Date/Time   NA 140 09/18/2015 1601   K 4.4 09/18/2015 1601   CL 104 09/18/2015 1601   CO2 31 09/18/2015 1601   GLUCOSE 84 09/18/2015 1601   BUN 11 09/18/2015 1601   CREATININE 1.03 09/18/2015 1601   CREATININE 0.93 09/05/2015 0722   CALCIUM 9.4 09/18/2015 1601   GFRNONAA >60 09/05/2015 0722   GFRAA >60 09/05/2015 [id number]    Assessment/Planning: POD #1 s/p left popliteal to peroneal bypass using reversed saphenous vein which included a thrombectomy of the popliteal artery. In addition a segment of the fibula was resected and then pinned back in place by Dr. Larey SeatMenz   I've discussed his pain medication with him extensively I've asked that he begin using the oral pain medication if he is having greater than 5 out of 10  pain and then he can supplement with parenteral.  I will keep in the intensive care unit but he can be a stepdown and if the need for a bed arises then he certainly could moved to the floor however I feel that given his pain control he would be better served by remaining in the intensive care unit perhaps one more day area in  I will plan for bed rest today given his young age I do not feel that this will be problematic and then beginning tomorrow he can be up to a chair and physical therapy will start on postoperative day #3 per Ortho he is full weightbearing as the fibula is the non-dominant bone in the lower leg  Harold [name]  09/21/2015, 8:06 AM

## 2015-09-21 NOTE — Progress Notes (Signed)
Pt was seen to have peaked T wave on the telemetry monitor and found to be asymptomatic . Dr. Ardyth Manam was notified. 12 Lead was obtained.Cardiology consult was ordered. Dr. Kirke CorinArida was consulted no new orders given. Will continue to follow.

## 2015-09-21 NOTE — Progress Notes (Signed)
Pt arrived on unit, VSS.  Harold [name]

## 2015-09-22 ENCOUNTER — Encounter: Payer: Self-pay | Admitting: Vascular Surgery

## 2015-09-22 LAB — TROPONIN I: Troponin I: <0.03 ng/mL (ref <0.031)

## 2015-09-22 LAB — SURGICAL PATHOLOGY

## 2015-09-22 MED ORDER — DIPHENHYDRAMINE HCL 50 MG/ML IJ SOLN: 12.5000 mg | Freq: Four times a day (QID) | INTRAMUSCULAR | Status: DC | PRN

## 2015-09-22 MED ORDER — SODIUM CHLORIDE 0.9 % IJ SOLN: 9.0000 mL | INTRAMUSCULAR | Status: DC | PRN

## 2015-09-22 MED ORDER — DIPHENHYDRAMINE HCL 12.5 MG/5ML PO ELIX: 12.5000 mg | ORAL_SOLUTION | Freq: Four times a day (QID) | ORAL | Status: DC | PRN

## 2015-09-22 MED ORDER — OXYCODONE HCL 5 MG PO TABS
30.0000 mg | ORAL_TABLET | ORAL | Status: DC | PRN
  Administered 2015-09-22 – 2015-09-26 (×19): 30 mg via ORAL
  Filled 2015-09-22 (×19): qty 6

## 2015-09-22 MED ORDER — NALOXONE HCL 0.4 MG/ML IJ SOLN: 0.4000 mg | INTRAMUSCULAR | Status: DC | PRN

## 2015-09-22 MED ORDER — HYDROMORPHONE 1 MG/ML IV SOLN
INTRAVENOUS | Status: DC
  Administered 2015-09-22: 18:00:00 via INTRAVENOUS
  Administered 2015-09-23 (×2): 1.5 mg via INTRAVENOUS
  Filled 2015-09-22: qty 25

## 2015-09-22 NOTE — Progress Notes (Signed)
Called MD to request foley removal as patient is reporting "pain and pressure" from foley. Urine output has been sufficient over night (>33850mLs) and yellow with only 95mLs in bladder per bladder scanner. MD gave order to remove foley.

## 2015-09-22 NOTE — Progress Notes (Signed)
Patient states he is not getting relief from current pain medication regime. I spoke to MD and decision to initiate a PCA was made.

## 2015-09-22 NOTE — Progress Notes (Signed)
[facility]  Daily Progress Note   Subjective  - 2 Day Post-Op  Patient is in bed; he is still having significant 9-10 out of 10 leg pain he notes that this is a different quality compared to preop. He again commented he can actually feel his foot now and that the pain is mostly in the calf area. He has been using IV and the oral pain medication but is still not getting relief  Objective Filed Vitals:   09/21/15 2100 09/21/15 2202 09/22/15 0502 09/22/15 1232  BP: 128/81 142/78 126/67 134/84  Pulse: 59 90 60 76  Temp:  98.6 F (37 C) 98.2 F (36.8 C) 97.8 F (36.6 C)  TempSrc:  Oral Oral Oral  Resp: 16  18 18   Height:      Weight:      SpO2: 99% 99% 99% 99%    Intake/Output Summary (Last 24 hours) at 09/22/15 1753 Last data filed at 09/22/15 1617  Gross per 24 hour  Intake    240 ml  Output   1650 ml  Net  -1410 ml    PULM  Normal effort , no use of accessory muscles CV  No JVD, RRR Abd      No distended, nontender VASC  left foot is hyperemic with brisk capillary refill. There is 3+ edema. VAC dressings are intact good Doppler signal  Laboratory CBC    Component Value Date/Time   WBC 12.2* 09/21/2015 0801   HGB 11.9* 09/21/2015 0801   HCT 36.2* 09/21/2015 0801   PLT 241 09/21/2015 0801    BMET    Component Value Date/Time   NA 140 09/21/2015 0801   K 4.4 09/21/2015 0801   CL 104 09/21/2015 0801   CO2 30 09/21/2015 0801   GLUCOSE 108* 09/21/2015 0801   BUN 11 09/21/2015 0801   CREATININE 0.84 09/21/2015 0801   CREATININE 1.03 09/18/2015 1601   CALCIUM 8.5* 09/21/2015 0801   GFRNONAA >60 09/21/2015 0801   GFRAA >60 09/21/2015 0801    Assessment/Planning: POD #1 s/p left popliteal to peroneal bypass using reversed saphenous vein which included a thrombectomy of the popliteal artery. In addition a segment of the fibula was resected and then pinned back in place by Dr. Larey SeatMenz   I've discussed his pain medication with him extensively  again and given his poor pain control I will begin a PCA with dilaudid  He is now on the floor with regular diet and foley is out  I will plan for bed rest today given his young age I do not feel that this will be problematic and then beginning tomorrow he can be up to a chair and physical therapy will start on postoperative day #3 per Ortho he is full weightbearing as the fibula is the non-dominant bone in the lower leg  Renford DillsSchnier, Gregory G  09/22/2015, 5:53 PM

## 2015-09-22 NOTE — Progress Notes (Signed)
Patient has voided on his own.

## 2015-09-23 MED ORDER — DIPHENHYDRAMINE HCL 50 MG/ML IJ SOLN: 12.5000 mg | Freq: Four times a day (QID) | INTRAMUSCULAR | Status: DC | PRN

## 2015-09-23 MED ORDER — SODIUM CHLORIDE 0.9 % IJ SOLN: 9.0000 mL | INTRAMUSCULAR | Status: DC | PRN

## 2015-09-23 MED ORDER — PANTOPRAZOLE SODIUM 40 MG PO TBEC
40.0000 mg | DELAYED_RELEASE_TABLET | Freq: Every day | ORAL | Status: DC
  Administered 2015-09-23 – 2015-09-29 (×7): 40 mg via ORAL
  Filled 2015-09-23 (×8): qty 1

## 2015-09-23 MED ORDER — HYDROMORPHONE 1 MG/ML IV SOLN
INTRAVENOUS | Status: DC
  Administered 2015-09-23: 1.5 mg via INTRAVENOUS
  Administered 2015-09-24: 14 mg via INTRAVENOUS
  Administered 2015-09-24: 3 mg via INTRAVENOUS
  Administered 2015-09-24: 1 mg via INTRAVENOUS
  Administered 2015-09-24: 2.5 mg via INTRAVENOUS
  Administered 2015-09-25: 1 mg via INTRAVENOUS
  Administered 2015-09-25 (×2): 1.5 mg via INTRAVENOUS
  Administered 2015-09-25: 1 mg via INTRAVENOUS
  Administered 2015-09-25: 1.5 mg via INTRAVENOUS
  Filled 2015-09-23: qty 25

## 2015-09-23 MED ORDER — DIPHENHYDRAMINE HCL 12.5 MG/5ML PO ELIX: 12.5000 mg | ORAL_SOLUTION | Freq: Four times a day (QID) | ORAL | Status: DC | PRN

## 2015-09-23 MED ORDER — NALOXONE HCL 0.4 MG/ML IJ SOLN: 0.4000 mg | INTRAMUSCULAR | Status: DC | PRN

## 2015-09-23 NOTE — Progress Notes (Signed)
Key Points: Use following P&T approved IV to PO antibiotic change policy.  Description contains the criteria that are approved Note: Policy Excludes:  Esophagectomy patientsPHARMACIST - PHYSICIAN COMMUNICATION DR:   Gilda CreaseSchnier CONCERNING: IV to Oral Route Change Policy  RECOMMENDATION: This patient is receiving pantoprazole by the intravenous route.  Based on criteria approved by the Pharmacy and Therapeutics Committee, the intravenous medication(s) is/are being converted to the equivalent oral dose form(s).   DESCRIPTION: These criteria include:  The patient is eating (either orally or via tube) and/or has been taking other orally administered medications for a least 24 hours  The patient has no evidence of active gastrointestinal bleeding or impaired GI absorption (gastrectomy, short bowel, patient on TNA or NPO).  If you have questions about this conversion, please contact the Pharmacy Department  []   [phone]( [phone] )  [facility] [x]   [phone]( [phone] )  [facility] []   [phone]( [phone] )  [facility] []   [phone]( [phone] )  [facility] []   [phone]( [phone] )  [facility]   Valentina GuChristy, Macedonio Scallon D, [facility] CenterRPH 09/23/2015 1:47 PM

## 2015-09-23 NOTE — Progress Notes (Signed)
[facility]  Daily Progress Note   Subjective  - 3 Day Post-Op  Patient is in bed he he is having much less leg pain today PCA seems to be effective  Objective Filed Vitals:   09/23/15 0800 09/23/15 0942 09/23/15 1200 09/23/15 1230  BP:  136/80  128/70  Pulse:  81  74  Temp:  97.7 F (36.5 C)  97.8 F (36.6 C)  TempSrc:  Oral  Oral  Resp: 16 18 15 18   Height:      Weight:      SpO2: 98% 98% 97% 99%    Intake/Output Summary (Last 24 hours) at 09/23/15 1404 Last data filed at 09/23/15 1300  Gross per 24 hour  Intake  828.5 ml  Output    475 ml  Net  353.5 ml    PULM  Normal effort , no use of accessory muscles CV  No JVD, RRR Abd      No distended, nontender VASC  left foot is hyperemic with brisk capillary refill. There is 3+ edema. VAC dressings are intact  Laboratory CBC    Component Value Date/Time   WBC 12.2* 09/21/2015 0801   HGB 11.9* 09/21/2015 0801   HCT 36.2* 09/21/2015 0801   PLT 241 09/21/2015 0801    BMET    Component Value Date/Time   NA 140 09/21/2015 0801   K 4.4 09/21/2015 0801   CL 104 09/21/2015 0801   CO2 30 09/21/2015 0801   GLUCOSE 108* 09/21/2015 0801   BUN 11 09/21/2015 0801   CREATININE 0.84 09/21/2015 0801   CREATININE 1.03 09/18/2015 1601   CALCIUM 8.5* 09/21/2015 0801   GFRNONAA >60 09/21/2015 0801   GFRAA >60 09/21/2015 0801    Assessment/Planning: POD #1 s/p left popliteal to peroneal bypass using reversed saphenous vein which included a thrombectomy of the popliteal artery. In addition a segment of the fibula was resected and then pinned back in place by Dr. Rosita KeaMenz  1.  Patient will continue the PCA for today he'll begin getting out of bed and physical therapy has been consult at      Renford DillsSchnier, Mckinnon Glick G  09/23/2015, 2:04 PM

## 2015-09-24 NOTE — Evaluation (Signed)
Physical Therapy Evaluation Patient Details Name: Harold [name] MRN: [id number] DOB: Mar 01, 1981 Today's Date: 09/24/2015   History of Present Illness  34 yo male with ischemia to LLE resulting in L poplitoperoneal bypass and popliteal artery thrombectomy.  Wearing wound vac, WBAT LLE.    Clinical Impression  Pt is demonstrating more comfort with gait as he walks, gradually placing LLE down on the floor.  He is a good candidate for crutch training, and will try with steps tomorrow assuming his lines are reduced to allow for this.  Main problem is wound vac which will make gait a difficulty.    Follow Up Recommendations Home health PT;Supervision - Intermittent    Equipment Recommendations  Crutches;Rolling walker with 5" wheels (Will determine tomorrow)    Recommendations for Other Services       Precautions / Restrictions Precautions Precautions: Fall (has wound vac on L LE lower leg) Restrictions Weight Bearing Restrictions: Yes Other Position/Activity Restrictions: WBAT      Mobility  Bed Mobility Overal bed mobility: Needs Assistance Bed Mobility: Supine to Sit     Supine to sit: Min assist (for LLE)     General bed mobility comments: Pt is using elevated HOB and bed rail to assist OOB  Transfers Overall transfer level: Needs assistance Equipment used: Rolling walker (2 wheeled);1 person hand held assist Transfers: Sit to/from [employer] CorporationStand;Stand Pivot Transfers Sit to Stand: Min assist;+2 physical assistance;+2 safety/equipment Stand pivot transfers: Min assist;+2 physical assistance;+2 safety/equipment       General transfer comment: has roommate helping  Ambulation/Gait Ambulation/Gait assistance: Min assist;+2 physical assistance;+2 safety/equipment (roommate is nurse) Ambulation Distance (Feet): 150 Feet Assistive device: Rolling walker (2 wheeled);1 person hand held assist (second person for lines) Gait Pattern/deviations: Step-through pattern;Decreased weight  shift to left;Decreased stance time - left;Wide base of support;Trunk flexed Gait velocity: reduced Gait velocity interpretation: Below normal speed for age/gender    Stairs Stairs: Yes Stairs assistance: Min assist Stair Management: Two rails;Step to pattern Number of Stairs: 8 General stair comments: up and down steps with rails as pt has 15 steps to enter apt, tolerating wbing through L toes only  Wheelchair Mobility    Modified Rankin (Stroke Patients Only)       Balance                                             Pertinent Vitals/Pain Pain Assessment: Faces Pain Score: 8  Faces Pain Scale: Hurts whole lot Pain Location: LLE with WBing  Pain Descriptors / Indicators: Operative site guarding;Cramping Pain Intervention(s): Limited activity within patient's tolerance;Monitored during session;Premedicated before session;Repositioned;Other (comment) (pump for pain)    Home Living Family/patient expects to be discharged to:: Private residence Living Arrangements: Spouse/significant other Available Help at Discharge: Family;Available PRN/intermittently Type of Home: Apartment Home Access: Stairs to enter Entrance Stairs-Rails: Right;Left;Can reach both Entrance Stairs-Number of Steps: 15 Home Layout: One level Home Equipment: None      Prior Function Level of Independence: Independent               Hand Dominance        Extremity/Trunk Assessment   Upper Extremity Assessment: Overall WFL for tasks assessed           Lower Extremity Assessment: LLE deficits/detail   LLE Deficits / Details: wound vac on LLE lower leg, cannot wb through heel  Cervical /  Trunk Assessment: Normal  Communication   Communication: No difficulties  Cognition Arousal/Alertness: Awake/alert Behavior During Therapy: WFL for tasks assessed/performed Overall Cognitive Status: Within Functional Limits for tasks assessed                       General Comments General comments (skin integrity, edema, etc.): Pt has wound vac and pain pump with IV's and demonstrates O2 sat of 100% to 97% but pulse up to 116 by end of stairs and walk, but initially was 108.    Exercises        Assessment/Plan    PT Assessment Patient needs continued PT services  PT Diagnosis Difficulty walking;Acute pain   PT Problem List Decreased strength;Decreased range of motion;Decreased activity tolerance;Decreased balance;Decreased mobility;Decreased coordination;Decreased knowledge of use of DME;Cardiopulmonary status limiting activity;Decreased skin integrity;Pain  PT Treatment Interventions DME instruction;Stair training;Gait training;Functional mobility training;Therapeutic activities;Therapeutic exercise;Balance training;Neuromuscular re-education;Patient/family education   PT Goals (Current goals can be found in the Care Plan section) Acute Rehab PT Goals Patient Stated Goal: to get walking and home PT Goal Formulation: With patient Time For Goal Achievement: 10/08/15 Potential to Achieve Goals: Good    Frequency Min 2X/week   Barriers to discharge Inaccessible home environment 15 steps to apt    Co-evaluation               End of Session Equipment Utilized During Treatment: Gait belt Activity Tolerance: Patient limited by fatigue;Patient limited by pain Patient left: in chair;with call bell/phone within reach;with chair alarm set;with family/visitor present Nurse Communication: Mobility status;Weight bearing status         Time: 1015-1058 PT Time Calculation (min) (ACUTE ONLY): 43 min   Charges:   PT Evaluation $Initial PT Evaluation Tier I: 1 Procedure PT Treatments $Gait Training: 23-37 mins   PT G CodesIvar Drape 22-Oct-2015, 4:20 PM   Samul Dada, PT MS Acute Rehab Dept. Number: [id number] and [facility] [phone]

## 2015-09-24 NOTE — Progress Notes (Signed)
[name]  Daily Progress Note   Subjective  - 4 Day Post-Op  Patient is in the chair, he actually did 4 steps today with therapy he is having  less leg pain today but used the PCA after walking  Objective Filed Vitals:   09/24/15 0400 09/24/15 0520 09/24/15 0756 09/24/15 0922  BP:  112/69  108/67  Pulse:  70  74  Temp:  97.9 F (36.6 C)  97.8 F (36.6 C)  TempSrc:  Oral  Oral  Resp: 18 16 12 16   Height:      Weight:      SpO2: 98% 99% 97% 98%    Intake/Output Summary (Last 24 hours) at 09/24/15 1256 Last data filed at 09/24/15 0935  Gross per 24 hour  Intake    611 ml  Output    775 ml  Net   -164 ml    PULM  Normal effort , no use of accessory muscles CV  No JVD, RRR Abd      No distended, nontender VASC  left foot is hyperemic with brisk capillary refill. There is 2+ edema. VAC dressings are intact  Laboratory CBC    Component Value Date/Time   WBC 12.2* 09/21/2015 0801   HGB 11.9* 09/21/2015 0801   HCT 36.2* 09/21/2015 0801   PLT 241 09/21/2015 0801    BMET    Component Value Date/Time   NA 140 09/21/2015 0801   K 4.4 09/21/2015 0801   CL 104 09/21/2015 0801   CO2 30 09/21/2015 0801   GLUCOSE 108* 09/21/2015 0801   BUN 11 09/21/2015 0801   CREATININE 0.84 09/21/2015 0801   CREATININE 1.03 09/18/2015 1601   CALCIUM 8.5* 09/21/2015 0801   GFRNONAA >60 09/21/2015 0801   GFRAA >60 09/21/2015 0801    Assessment/Planning: POD #4 s/p left popliteal to peroneal bypass using reversed saphenous vein which included a thrombectomy of the popliteal artery. In addition a segment of the fibula was resected and then pinned back in place by Dr. Rosita KeaMenz  1.  Patient will continue the PCA for today he'll continue to get out of bed and do physical therapy   Renford DillsSchnier, Magdelene Ruark G  09/24/2015, 12:56 PM

## 2015-09-25 MED ORDER — HYDROMORPHONE HCL 1 MG/ML IJ SOLN
1.0000 mg | INTRAMUSCULAR | Status: DC | PRN
  Administered 2015-09-25 (×2): 1 mg via INTRAVENOUS
  Filled 2015-09-25 (×2): qty 1

## 2015-09-25 MED ORDER — HYDROMORPHONE 1 MG/ML IV SOLN
INTRAVENOUS | Status: DC
  Administered 2015-09-25: 23:00:00 via INTRAVENOUS
  Administered 2015-09-26: 4.5 mg via INTRAVENOUS
  Administered 2015-09-26: 1.5 mg via INTRAVENOUS
  Administered 2015-09-26: 2.5 mg via INTRAVENOUS
  Administered 2015-09-26: 2 mg via INTRAVENOUS
  Filled 2015-09-25: qty 25

## 2015-09-25 MED ORDER — HYDROMORPHONE HCL 1 MG/ML IJ SOLN
1.0000 mg | Freq: Once | INTRAMUSCULAR | Status: AC
  Administered 2015-09-25: 1 mg via INTRAVENOUS
  Filled 2015-09-25: qty 1

## 2015-09-25 MED ORDER — SODIUM CHLORIDE 0.9 % IV SOLN
INTRAVENOUS | Status: DC
  Administered 2015-09-25 – 2015-09-28 (×2): via INTRAVENOUS

## 2015-09-25 NOTE — Care Management (Addendum)
Received call from [name] at [facility] who stated that they would now not be able to take patient due to no RN available this week. Contacted [name] who would be willing to take patient this week for RN PT. Discussed this with the patient who [facility]. Referral sent. Patient will not need to go home with wound vac per Dr Gilda CreaseSchnier.

## 2015-09-25 NOTE — Progress Notes (Signed)
Physical Therapy Treatment Patient Details Name: Harold [name] MRN: [id number] DOB: 11/28/1980 Today's Date: 09/25/2015    History of Present Illness 34 yo male with ischemia to LLE resulting in L poplitoperoneal bypass and popliteal artery thrombectomy.  Wearing wound vac, WBAT LLE.      PT Comments    Pt notes increased difficulty placing any weight through left lower extremity today due to pain/swelling. Pt/spouse education on swelling management and true elevation. Pt participates with stand exercises for the left lower extremity with spouse educated as well. Pt requires rest between each set with exercises; fatigue, mild shortness of breath and increased heart rate to 112-120 noted with each set. Discussion with pt and spouse regarding home set up; for safety concerns regarding limited movement, weightbearing on left lower extremity, and decreased endurance would recommend bedside commode and tub bench for optimal safety and fall prevention at home. Continue PT for progression of strength, endurance and safe functional mobility to allow for optimal return home.   Follow Up Recommendations  Home health PT;Supervision - Intermittent     Equipment Recommendations  Rolling walker with 5" wheels;Other (comment) (bedside commode and tub bench)    Recommendations for Other Services       Precautions / Restrictions Precautions Precautions: Fall Restrictions Weight Bearing Restrictions: Yes Other Position/Activity Restrictions: WBAT    Mobility  Bed Mobility               General bed mobility comments: Not tested; up in chair  Transfers Overall transfer level: Needs assistance Equipment used: Rolling walker (2 wheeled);1 person hand held assist Transfers: Sit to/from Stand Sit to Stand: Min guard         General transfer comment: slow to rise; minimal use of LLE  Ambulation/Gait Ambulation/Gait assistance: Min guard Ambulation Distance (Feet):  (Only several steps  fwd/bkwd/side for stand exercises) Assistive device: Rolling walker (2 wheeled);1 person hand held assist           Stairs            Wheelchair Mobility    Modified Rankin (Stroke Patients Only)       Balance                                    Cognition Arousal/Alertness: Awake/alert Behavior During Therapy: WFL for tasks assessed/performed Overall Cognitive Status: Within Functional Limits for tasks assessed                      Exercises      General Comments General comments (skin integrity, edema, etc.): Continues with IVs; and pain pump      Pertinent Vitals/Pain Pain Assessment: 0-10 Pain Score: 7  Pain Location: LLE Pain Descriptors / Indicators: Jabbing;Pressure;Tightness;Squeezing;Constant Pain Intervention(s): Limited activity within patient's tolerance    Home Living                      Prior Function            PT Goals (current goals can now be found in the care plan section) Progress towards PT goals: Progressing toward goals    Frequency  Min 2X/week    PT Plan Current plan remains appropriate    Co-evaluation             End of Session   Activity Tolerance: Patient limited by pain;Patient tolerated treatment well (Increased difficulty putting  weight through LLE) Patient left: in chair;with call bell/phone within reach;with chair alarm set;with family/visitor present     Time: 1914-7829 PT Time Calculation (min) (ACUTE ONLY): 27 min  Charges:  $Therapeutic Exercise: 8-22 mins $Therapeutic Activity: 8-22 mins                    G Codes:      [name] 09/25/2015, 3:46 PM

## 2015-09-25 NOTE — Progress Notes (Signed)
Pt c/o pain and crying despite pain meds given.  Spoke to dr.dew who is on call for schnier.  He stated to resume previous PCA order.

## 2015-09-25 NOTE — Progress Notes (Signed)
[facility]  Daily Progress Note   Subjective  - 5 Day Post-Op  Patient is in the chair, he actually did 4 steps today with therapy he is having  less leg pain today but still used the PCA after walking  Objective Filed Vitals:   09/25/15 1200 09/25/15 1224 09/25/15 1434 09/25/15 1600  BP:  116/68    Pulse:  69 80   Temp:  97.8 F (36.6 C)    TempSrc:  Oral    Resp: 14   16  Height:      Weight:      SpO2: 100% 100% 98% 94%    Intake/Output Summary (Last 24 hours) at 09/25/15 1725 Last data filed at 09/25/15 1300  Gross per 24 hour  Intake    240 ml  Output    600 ml  Net   -360 ml    PULM  Normal effort , no use of accessory muscles CV  No JVD, RRR Abd      No distended, nontender VASC  left foot is hyperemic with brisk capillary refill. There is 2+ edema. incision are intact  Laboratory CBC    Component Value Date/Time   WBC 12.2* 09/21/2015 0801   HGB 11.9* 09/21/2015 0801   HCT 36.2* 09/21/2015 0801   PLT 241 09/21/2015 0801    BMET    Component Value Date/Time   NA 140 09/21/2015 0801   K 4.4 09/21/2015 0801   CL 104 09/21/2015 0801   CO2 30 09/21/2015 0801   GLUCOSE 108* 09/21/2015 0801   BUN 11 09/21/2015 0801   CREATININE 0.84 09/21/2015 0801   CREATININE 1.03 09/18/2015 1601   CALCIUM 8.5* 09/21/2015 0801   GFRNONAA >60 09/21/2015 0801   GFRAA >60 09/21/2015 0801    Assessment/Planning: POD #5 s/p left popliteal to peroneal bypass using reversed saphenous vein which included a thrombectomy of the popliteal artery. In addition a segment of the fibula was resected and then pinned back in place by Dr. Rosita KeaMenz  1.  Patient will DC the PCA for today he'll continue to get out of bed and do physical therapy.  VAC removed and incision is CD&I  Painted with betadine and wrapped with a dry gauze and ACE   Renford DillsSchnier, Preslyn Warr G  09/25/2015, 5:25 PM

## 2015-09-25 NOTE — Progress Notes (Signed)
Spoke with patient who si from home and was independent prior to surgery. Unsure if will need home wound Vac. Have paged Dr Gilda CreaseSchnier to discuss.  Patient lives in upstairs apartment. Will possibly need rolling walker. Patient apartment is all on one level and he stated that he will be OK once he gets into his apartment. Patient has assistance of family and friends some of which nurses. Sister in Social workerlaw and brother. Anticipate HH Rn and PT at discharge. [facility] in network [facility] agencies for [facility] information. [facility]. [place] agencies not in network. Will offer choice when have details for patient. More to follow.

## 2015-09-25 NOTE — Care Management (Signed)
[facility] with no co [facility]. Spoke with Patient who is agreeable. Notified Anibal Hendersonim Henderson of patient acceptance and referral sent. Anticipate discharge soon.

## 2015-09-26 MED ORDER — KETOROLAC TROMETHAMINE 30 MG/ML IJ SOLN
30.0000 mg | Freq: Four times a day (QID) | INTRAMUSCULAR | Status: DC | PRN
  Administered 2015-09-27 – 2015-09-29 (×5): 30 mg via INTRAVENOUS
  Filled 2015-09-26 (×5): qty 1

## 2015-09-26 MED ORDER — OXYCODONE HCL 5 MG PO TABS
40.0000 mg | ORAL_TABLET | ORAL | Status: DC | PRN
  Administered 2015-09-26 – 2015-09-29 (×14): 40 mg via ORAL
  Filled 2015-09-26 (×14): qty 8

## 2015-09-26 MED ORDER — HYDROMORPHONE 1 MG/ML IV SOLN
INTRAVENOUS | Status: DC
  Administered 2015-09-27: 1 mg via INTRAVENOUS
  Administered 2015-09-27: 0.398 mg via INTRAVENOUS
  Administered 2015-09-27: 1.4 mg via INTRAVENOUS
  Administered 2015-09-27 (×2): 0 mg via INTRAVENOUS
  Administered 2015-09-28: 1 mg via INTRAVENOUS

## 2015-09-26 NOTE — Progress Notes (Signed)
Harold [name]  Daily Progress Note   Subjective  - 6 Day Post-Op  Patient is in the bed. Last night he apparently had severe pain based on review of the record he received 1 supplemental IV dose of Dilaudid at 8:00 and 11:00 was restarted on his PCA.  Objective Filed Vitals:   09/26/15 1211 09/26/15 1230 09/26/15 1252 09/26/15 1706  BP:  104/56 128/68   Pulse:  73    Temp:  97.7 F (36.5 C)    TempSrc:  Oral    Resp: 12 17  11   Height:      Weight:      SpO2: 100% 100%  100%    Intake/Output Summary (Last 24 hours) at 09/26/15 1821 Last data filed at 09/26/15 1100  Gross per 24 hour  Intake    511 ml  Output    900 ml  Net   -389 ml    PULM  Normal effort , no use of accessory muscles CV  No JVD, RRR Abd      No distended, nontender VASC  left foot is hyperemic with brisk capillary refill. There is 2+ edema. incision are intact minimal drainage on the dressing which is the same dressing from yesterday that I placed  Laboratory CBC    Component Value Date/Time   WBC 12.2* 09/21/2015 0801   HGB 11.9* 09/21/2015 0801   HCT 36.2* 09/21/2015 0801   PLT 241 09/21/2015 0801    BMET    Component Value Date/Time   NA 140 09/21/2015 0801   K 4.4 09/21/2015 0801   CL 104 09/21/2015 0801   CO2 30 09/21/2015 0801   GLUCOSE 108* 09/21/2015 0801   BUN 11 09/21/2015 0801   CREATININE 0.84 09/21/2015 0801   CREATININE 1.03 09/18/2015 1601   CALCIUM 8.5* 09/21/2015 0801   GFRNONAA >60 09/21/2015 0801   GFRAA >60 09/21/2015 0801    Assessment/Planning: POD #6 s/p left popliteal to peroneal bypass using reversed saphenous vein which included a thrombectomy of the popliteal artery. In addition a segment of the fibula was resected and then pinned back in place by Dr. Rosita [name]  1.  Patient is now back on the PCA apparently he has had a difficult past 24 hours with pain management I will increase his base of proximal code on to 40 I will increase the time  interval between dilaudid lockout. I will also start him on Toradol   Harold [name], Harold [name]  09/26/2015, 6:21 PM

## 2015-09-26 NOTE — Progress Notes (Signed)
Initial Nutrition Assessment     INTERVENTION:  Meals and snacks: Cater to pt preferences    NUTRITION DIAGNOSIS:    (none at this time) related to   as evidenced by  .    GOAL:   Patient will meet greater than or equal to 90% of their needs    MONITOR:    (Energy intake, )  REASON FOR ASSESSMENT:   LOS    ASSESSMENT:      Pt s/p left popliteal to peronneal bypass  Past Medical History  Diagnosis Date  . Back pain   . Arthritis   . Shingles   . Anxiety     Current Nutrition: eating mostly 60-100% of meals per pt, appetite good  Food/Nutrition-Related History: overall normal intake prior to admission   Scheduled Medications:  . aspirin EC  81 mg Oral Daily  . clopidogrel  75 mg Oral Q breakfast  . docusate sodium  100 mg Oral Daily  . HYDROmorphone   Intravenous 6 times per day  . pantoprazole  40 mg Oral Daily    Continuous Medications:  . sodium chloride 10 mL/hr at 09/25/15 2335     Electrolyte/Renal Profile and Glucose Profile:   Recent Labs Lab 09/20/15 2250 09/21/15 0801  NA  --  140  K  --  4.4  CL  --  104  CO2  --  30  BUN  --  11  CREATININE  --  0.84  CALCIUM  --  8.5*  MG 1.6* 2.1  GLUCOSE  --  108*    Gastrointestinal Profile: Last BM: 11/29     Weight Change: stable wt    Diet Order:  Diet regular Room service appropriate?: Yes; Fluid consistency:: Thin  Skin:   reviewed   Height:   Ht Readings from Last 1 Encounters:  09/20/15 6' (1.829 m)    Weight:   Wt Readings from Last 1 Encounters:  09/20/15 174 lb 2.6 oz (79 kg)    Ideal Body Weight:     BMI:  Body mass index is 23.62 kg/(m^2).   EDUCATION NEEDS:   No education needs identified at this time  LOW Care Level  Joie Reamer B. Freida BusmanAllen, RD, LDN [phone] (pager)

## 2015-09-26 NOTE — Progress Notes (Signed)
Physical Therapy Treatment Patient Details Name: Harold [name] MRN: [id number] DOB: September 25, 1981 Today's Date: 09/26/2015    History of Present Illness 34 yo male with ischemia to LLE resulting in L poplitoperoneal bypass and popliteal artery thrombectomy.  Wearing wound vac, WBAT LLE.      PT Comments    Pt notes he is having quite a bit of pain since removal of the wound vac especially in left foot. Pt does not wish out of bed at this time. Pt agreeable to bed exercises and tolerates with rest breaks. Range in left hip, knee and ankle limited from full range, but improves with continued repetitions. Inspection of left foot reveals red/purple areas on the bottom of foot and blackened area on left great toe. Pt does not difficulty wiggling toes on the left; does demonstrate limited active range of motion. Pt encouraged to continue exercises throughout the day several times. Pt's left lower extremity elevated with heel suspended. Continue PT to progress left lower extremity active range of motion, strength, transfers, ambulation and stair climbing to allow pt to return home safely. If unable to ambulate, may need to consider short term care stay.   Follow Up Recommendations  Home health PT;Supervision - Intermittent     Equipment Recommendations  Rolling walker with 5" wheels;Other (comment) (bedside commode and tub bench)    Recommendations for Other Services       Precautions / Restrictions Precautions Precautions: Fall Restrictions Weight Bearing Restrictions: Yes Other Position/Activity Restrictions: WBAT    Mobility  Bed Mobility               General bed mobility comments: Not tested due to pain level; pt had a rough night with pain post removing wound vacs; prefers to stay in bed currently  Transfers                 General transfer comment: deferred  Ambulation/Gait             General Gait Details: deferred   Stairs            Wheelchair  Mobility    Modified Rankin (Stroke Patients Only)       Balance                                    Cognition Arousal/Alertness: Awake/alert Behavior During Therapy: WFL for tasks assessed/performed Overall Cognitive Status: Within Functional Limits for tasks assessed                      Exercises General Exercises - Lower Extremity Ankle Circles/Pumps: AROM;Both;20 reps;Supine Quad Sets: Strengthening;Both;20 reps;Supine Gluteal Sets: Strengthening;Both;20 reps;Supine Short Arc Quad: AROM;Left;20 reps;Supine (performed in sets of 5) Heel Slides: AROM;Left;20 reps;Supine (performed in sets of 5) Hip ABduction/ADduction: AROM;Left;20 reps;Supine (performed in sets of 5) Straight Leg Raises: AROM;Left;20 reps;Supine (performed in sets of 5)    General Comments General comments (skin integrity, edema, etc.): wound vac removed. several redended/purple area on foot/heel      Pertinent Vitals/Pain Faces Pain Scale: Hurts even more Pain Location: LLE especially foot/heel Pain Descriptors / Indicators: Constant Pain Intervention(s): Limited activity within patient's tolerance;Repositioned (using PCA)    Home Living                      Prior Function            PT Goals (current  goals can now be found in the care plan section) Progress towards PT goals: Progressing toward goals (slowly)    Frequency  Min 2X/week    PT Plan Current plan remains appropriate    Co-evaluation             End of Session   Activity Tolerance: Patient limited by pain Patient left: in bed;with call bell/phone within reach;with family/visitor present (refused bed alarm)     Time: 1107-1130 PT Time Calculation (min) (ACUTE ONLY): 23 min  Charges:  $Therapeutic Exercise: 23-37 mins                    G Codes:      [name] 09/26/2015, 11:44 AM

## 2015-09-27 MED ORDER — POLYETHYLENE GLYCOL 3350 17 G PO PACK
34.0000 g | PACK | Freq: Two times a day (BID) | ORAL | Status: DC
  Administered 2015-09-28 – 2015-09-29 (×2): 34 g via ORAL
  Filled 2015-09-27 (×3): qty 2

## 2015-09-27 MED ORDER — MAGNESIUM HYDROXIDE 400 MG/5ML PO SUSP
30.0000 mL | Freq: Every day | ORAL | Status: DC
  Administered 2015-09-27 – 2015-09-28 (×2): 30 mL via ORAL
  Filled 2015-09-27 (×3): qty 30

## 2015-09-27 NOTE — Progress Notes (Signed)
[facility]  Daily Progress Note   Subjective  - 7Day Post-Op  Patient is in the bed. He has done much better with his pain control today the addition of the Toradol seems to have helped quite a bit of note he has not yet had a bowel movement postop  Objective Filed Vitals:   09/27/15 0800 09/27/15 1159 09/27/15 1257 09/27/15 1543  BP:   118/61   Pulse:   66   Temp:   98.1 F (36.7 C)   TempSrc:   Oral   Resp: 16 14 16 13   Height:      Weight:      SpO2: 100% 100% 100% 100%    Intake/Output Summary (Last 24 hours) at 09/27/15 1747 Last data filed at 09/27/15 1657  Gross per 24 hour  Intake   1098 ml  Output   2475 ml  Net  -1377 ml    PULM  Normal effort , no use of accessory muscles CV  No JVD, RRR Abd      No distended, nontender VASC  left foot is hyperemic with brisk capillary refill. There is 2+ edema. incisions are intact clean dressing is present  Laboratory CBC    Component Value Date/Time   WBC 12.2* 09/21/2015 0801   HGB 11.9* 09/21/2015 0801   HCT 36.2* 09/21/2015 0801   PLT 241 09/21/2015 0801    BMET    Component Value Date/Time   NA 140 09/21/2015 0801   K 4.4 09/21/2015 0801   CL 104 09/21/2015 0801   CO2 30 09/21/2015 0801   GLUCOSE 108* 09/21/2015 0801   BUN 11 09/21/2015 0801   CREATININE 0.84 09/21/2015 0801   CREATININE 1.03 09/18/2015 1601   CALCIUM 8.5* 09/21/2015 0801   GFRNONAA >60 09/21/2015 0801   GFRAA >60 09/21/2015 0801    Assessment/Planning: POD #7 s/p left popliteal to peroneal bypass using reversed saphenous vein which included a thrombectomy of the popliteal artery. In addition a segment of the fibula was resected and then pinned back in place by Dr. Rosita KeaMenz  1.  Patient is doing much better with his pain control he is only used his PCA several times and is only taking the OxyContin twice today. I will increase his laxatives and plan to discontinue the PCA altogether tomorrow morning and hopefully  he will tolerate that and potentially he could be discharged home on Friday Renford DillsSchnier, Gregory G  09/27/2015, 5:47 PM

## 2015-09-28 MED ORDER — HYDROMORPHONE HCL 1 MG/ML IJ SOLN: 0.5000 mg | INTRAMUSCULAR | Status: DC | PRN

## 2015-09-28 NOTE — Progress Notes (Signed)
[facility]  Daily Progress Note   Subjective  - 8 Days Post-Op  Doing better Pain control improved Walking well with PT No major events overnight  Objective Filed Vitals:   09/28/15 0449 09/28/15 0800 09/28/15 1207 09/28/15 1424  BP: 113/74  117/77   Pulse: 64  88 85  Temp: 97.8 F (36.6 C)  97.9 F (36.6 C)   TempSrc: Oral  Oral   Resp: 17 14    Height:      Weight:      SpO2: 100% 98% 100% 100%    Intake/Output Summary (Last 24 hours) at 09/28/15 1527 Last data filed at 09/28/15 1300  Gross per 24 hour  Intake    335 ml  Output   1625 ml  Net  -1290 ml    PULM  CTAB CV  RRR VASC  Foot warm, leg wrapped in ACE wrap  Laboratory CBC    Component Value Date/Time   WBC 12.2* 09/21/2015 0801   HGB 11.9* 09/21/2015 0801   HCT 36.2* 09/21/2015 0801   PLT 241 09/21/2015 0801    BMET    Component Value Date/Time   NA 140 09/21/2015 0801   K 4.4 09/21/2015 0801   CL 104 09/21/2015 0801   CO2 30 09/21/2015 0801   GLUCOSE 108* 09/21/2015 0801   BUN 11 09/21/2015 0801   CREATININE 0.84 09/21/2015 0801   CREATININE 1.03 09/18/2015 1601   CALCIUM 8.5* 09/21/2015 0801   GFRNONAA >60 09/21/2015 0801   GFRAA >60 09/21/2015 0801    Assessment/Planning: POD #8 s/p left popliteal to distal bypass   Improving  Doing well with PT  Stopped PCA today and doing better  Leg wrapped today, foot is warm  Possible discharge tomorrow    Harold [name]  09/28/2015, 3:27 PM

## 2015-09-28 NOTE — Progress Notes (Signed)
Physical Therapy Treatment Patient Details Name: Harold [name] MRN: [id number] DOB: [birth year] Today's Date: 09/28/2015    History of Present Illness 34 yo male with ischemia to LLE resulting in L poplitoperoneal bypass and popliteal artery thrombectomy.  Wearing wound vac, WBAT LLE.      PT Comments    Pt feeling better overall; pain continues to be 5-6/10 in left lower extremity. Pt with improved ambulation and transfers and Modified independence with bed mobility. Pt notes he feels comfortable to perform steps into home and has an excellent support system at home. Continue PT for progression of ambulation and all functional mobility to provide smooth transition home.   Follow Up Recommendations  Home health PT;Supervision - Intermittent     Equipment Recommendations  Rolling walker with 5" wheels;Other (comment)    Recommendations for Other Services       Precautions / Restrictions Precautions Precautions: Fall Restrictions Weight Bearing Restrictions: Yes LLE Weight Bearing: Weight bearing as tolerated    Mobility  Bed Mobility Overal bed mobility: Modified Independent Bed Mobility: Sit to Supine     Supine to sit: Modified independent (Device/Increase time)     General bed mobility comments: Mild increased time  Transfers Overall transfer level: Modified independent Equipment used: Rolling walker (2 wheeled);1 person hand held assist Transfers: Sit to/from Stand Sit to Stand: Modified independent (Device/Increase time)         General transfer comment: Good safety with rw  Ambulation/Gait Ambulation/Gait assistance: Min guard Ambulation Distance (Feet): 120 Feet Assistive device: Rolling walker (2 wheeled);1 person hand held assist Gait Pattern/deviations: Step-to pattern (NWB to TWB on L) Gait velocity: reduced   General Gait Details: Place little to no weight on toes on L foot   Stairs            Wheelchair Mobility    Modified Rankin  (Stroke Patients Only)       Balance                                    Cognition Arousal/Alertness: Awake/alert Behavior During Therapy: WFL for tasks assessed/performed Overall Cognitive Status: Within Functional Limits for tasks assessed                      Exercises      General Comments        Pertinent Vitals/Pain Pain Assessment: 0-10 Pain Score: 6  Pain Location: LLE Pain Intervention(s): Limited activity within patient's tolerance;Premedicated before session;Monitored during session;Repositioned    Home Living                      Prior Function            PT Goals (current goals can now be found in the care plan section) Progress towards PT goals: Progressing toward goals    Frequency  Min 2X/week    PT Plan Current plan remains appropriate    Co-evaluation             End of Session Equipment Utilized During Treatment: Gait belt Activity Tolerance: Patient limited by pain Patient left: in bed;with call bell/phone within reach;Other (comment) (LLE elevated; pt did not wish alarm on, will call for assist)     Time: [id number] PT Time Calculation (min) (ACUTE ONLY): 20 min  Charges:  $Gait Training: 8-22 mins  G Codes:      [name] 09/28/2015, 3:23 PM

## 2015-09-28 NOTE — Care Management (Signed)
[facility] offer[facility] assistance with patient discharge planning on [phone]. Attempted to retun call left voicemail with my contact information.

## 2015-09-29 NOTE — Discharge Instructions (Signed)
Change dressing to the incision as needed.  Take all medications as prescribed.

## 2015-09-29 NOTE — Progress Notes (Signed)
MD ordered patient to be discharged home with home health.  Discharge instructions were reviewed with the patient and he voiced understanding.  Prescription was given to the patient.  His follow-up appointment was made.  IV was removed with catheter intact.  Patient was instructed about changing the dressing if needed and was given some extra supplies.  All patients questions were answered.  Patient left via wheelchair escorted by nursing.

## 2015-09-29 NOTE — Discharge Summary (Signed)
[facility]    Discharge Summary    Patient ID:  Harold NethJoshua Denis MRN: [id number] DOB/AGE: 10/28/1980 34 y.o.  Admit date: 09/20/2015 Discharge date: 09/29/2015 Date of Surgery: 09/20/2015 Surgeon: Surgeon(s): Renford DillsGregory G Schnier, MD Kennedy BuckerMichael Menz, MD Kennedy BuckerMichael Menz, MD Annice NeedyJason S Dew, MD  Admission Diagnosis: ASO WITH ULCERATION; gangrene left great toe  Discharge Diagnoses:  ASO WITH ULCERATION any: Gangrene left great toe  Secondary Diagnoses: Past Medical History  Diagnosis Date  . Back pain   . Arthritis   . Shingles   . Anxiety     Procedure(s): BYPASS GRAFT POPLITEAL TO PERONEAL OPEN REDUCTION INTERNAL FIXATION (ORIF) ANKLE FRACTURE EMBOLECTOMY  Discharged Condition: good  HPI:  The patient presented with a gangrenous ulceration of the left great toe. He had undergone intervention the past which did not prove to be very durable. Attempts at re-intervention were not successful and it was therefore recommended he undergo a traditional bypass grafting with a popliteal to peroneal bypass. On the day of admission he underwent surgery. Plan was for a lateral approach to the distal peroneal and so a segment of tibia was resected. Later in the case Dr. Trilby DrummerManns did replace that segment of bone and pinned the portion of the fibula in place. The bypass was completed using reversed great saphenous vein from the left leg intraoperatively thrombus was noted throughout the distal popliteal and this was treated with embolectomy using Fogarty catheters.  Postoperatively his course has been largely marked with pain management issues. He ultimately did require Dilaudid PCA but has been weaned successfully from this he is now taking oxycodone 20-40 mg every 6 hours. He has done well with physical therapy and in fact has done 4-6 steps. He is ambulating with difficulty secondary to the pain of his left leg however with a rolling walker he is doing quite well. Therapy is  cleared him for outpatient treatment.  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 He has voiced understanding.  Hospital Course:  Harold [name] is a 34 y.o. male is S/P Left Procedure(s): BYPASS GRAFT POPLITEAL TO PERONEAL OPEN REDUCTION INTERNAL FIXATION (ORIF) ANKLE FRACTURE EMBOLECTOMY Extubated: POD # 0 Physical exam: Left leg demonstrates 4+ pitting edema the incisions are closed with a combination of nylon sutures and staples and they are intact. There is some drainage noted. There is no erythema no odor. Otherwise are clean they are intact and appeared he be healing well Post-op wounds healing well Pt. Ambulating, voiding and taking PO diet without difficulty. Pt pain controlled with PO pain meds. Labs as below Complications:none  Consults:     Significant Diagnostic Studies: CBC Lab Results  Component Value Date   WBC 12.2* 09/21/2015   HGB 11.9* 09/21/2015   HCT 36.2* 09/21/2015   MCV 89.3 09/21/2015   PLT 241 09/21/2015    BMET    Component Value Date/Time   NA 140 09/21/2015 0801   K 4.4 09/21/2015 0801   CL 104 09/21/2015 0801   CO2 30 09/21/2015 0801   GLUCOSE 108* 09/21/2015 0801   BUN 11 09/21/2015 0801   CREATININE 0.84 09/21/2015 0801   CREATININE 1.03 09/18/2015 1601  CALCIUM 8.5* 09/21/2015 0801   GFRNONAA >60 09/21/2015 0801   GFRAA >60 09/21/2015 0801   COAG Lab Results  Component Value Date   INR 0.95 09/18/2015   INR 1.01 12/28/2012     Disposition:  Discharge to :Home Discharge Instructions    Call MD for:  redness, tenderness, or signs of infection (pain, swelling, bleeding, redness,  odor or green/yellow discharge around incision site)    Complete by:  As directed      Call MD for:  severe or increased pain, loss or decreased feeling  in affected limb(s)    Complete by:  As directed      Call MD for:  temperature >100.5    Complete by:  As directed      Discharge instructions    Complete by:  As directed   PT/OT to see as out patient     Driving Restrictions    Complete by:  As directed   No driving for 2 weeks     Lifting restrictions    Complete by:  As directed   Full weight bearing to the left leg as tolerated     Resume previous diet    Complete by:  As directed             Medication List    STOP taking these medications        nitroGLYCERIN 0.2 mg/hr patch  Commonly known as:  NITRO-DUR     Oxycodone HCl 20 MG Tabs     OXYCONTIN 30 MG 12 hr tablet  Generic drug:  oxyCODONE      TAKE these medications        aspirin 81 MG EC tablet  Take 1 tablet (81 mg total) by mouth daily.     clopidogrel 75 MG tablet  Commonly known as:  PLAVIX  Take 1 tablet (75 mg total) by mouth daily.       Verbal and written Discharge instructions given to the patient. Wound care per Discharge AVS     Follow-up Information    Follow up with Schnier, [name], MD In 6 days.   Specialties:  Vascular Surgery, Cardiology, Radiology, Vascular Surgery   Why:  follow up after procedure with Schnier  add on to thursday's office   Contact information:   [address] [place] [address] [phone]       Signed: Renford Dills, MD  09/29/2015, 4:05 PM

## 2015-09-29 NOTE — Care Management (Signed)
[facility]. DME rolling walker provided to patient by [facility]. Patient notified of co pay of $40 for Tub Shower Bench. Declined this.  No other CM needs.

## 2015-12-19 ENCOUNTER — Ambulatory Visit: Payer: Managed Care, Other (non HMO) | Admitting: Cardiovascular Disease

## 2016-07-10 DIAGNOSIS — F320 Major depressive disorder, single episode, mild: Secondary | ICD-10-CM | POA: Insufficient documentation

## 2016-07-10 DIAGNOSIS — Z9889 Other specified postprocedural states: Secondary | ICD-10-CM | POA: Insufficient documentation

## 2016-07-10 DIAGNOSIS — N5089 Other specified disorders of the male genital organs: Secondary | ICD-10-CM | POA: Insufficient documentation

## 2016-07-10 DIAGNOSIS — N5319 Other ejaculatory dysfunction: Secondary | ICD-10-CM | POA: Insufficient documentation

## 2016-08-19 ENCOUNTER — Other Ambulatory Visit (INDEPENDENT_AMBULATORY_CARE_PROVIDER_SITE_OTHER): Payer: Self-pay | Admitting: Vascular Surgery

## 2016-08-19 DIAGNOSIS — I739 Peripheral vascular disease, unspecified: Secondary | ICD-10-CM

## 2016-08-19 DIAGNOSIS — M79605 Pain in left leg: Secondary | ICD-10-CM

## 2016-08-20 ENCOUNTER — Encounter (INDEPENDENT_AMBULATORY_CARE_PROVIDER_SITE_OTHER): Payer: Self-pay | Admitting: Vascular Surgery

## 2016-08-20 ENCOUNTER — Ambulatory Visit (INDEPENDENT_AMBULATORY_CARE_PROVIDER_SITE_OTHER): Payer: Managed Care, Other (non HMO) | Admitting: Vascular Surgery

## 2016-08-20 ENCOUNTER — Ambulatory Visit (INDEPENDENT_AMBULATORY_CARE_PROVIDER_SITE_OTHER): Payer: Managed Care, Other (non HMO)

## 2016-08-20 VITALS — BP 140/89 | HR 43 | Resp 16 | Ht 72.0 in | Wt 188.0 lb

## 2016-08-20 DIAGNOSIS — I70312 Atherosclerosis of unspecified type of bypass graft(s) of the extremities with intermittent claudication, left leg: Secondary | ICD-10-CM | POA: Diagnosis not present

## 2016-08-20 DIAGNOSIS — I739 Peripheral vascular disease, unspecified: Secondary | ICD-10-CM

## 2016-08-20 DIAGNOSIS — M79605 Pain in left leg: Secondary | ICD-10-CM | POA: Diagnosis not present

## 2016-08-20 DIAGNOSIS — F17200 Nicotine dependence, unspecified, uncomplicated: Secondary | ICD-10-CM

## 2016-08-22 ENCOUNTER — Encounter (INDEPENDENT_AMBULATORY_CARE_PROVIDER_SITE_OTHER): Payer: Self-pay

## 2016-08-22 DIAGNOSIS — F17200 Nicotine dependence, unspecified, uncomplicated: Secondary | ICD-10-CM | POA: Insufficient documentation

## 2016-08-22 DIAGNOSIS — I70229 Atherosclerosis of native arteries of extremities with rest pain, unspecified extremity: Secondary | ICD-10-CM | POA: Insufficient documentation

## 2016-08-22 DIAGNOSIS — I739 Peripheral vascular disease, unspecified: Secondary | ICD-10-CM | POA: Insufficient documentation

## 2016-08-22 NOTE — Progress Notes (Signed)
Subjective:    Patient ID: Harold [name], male    DOB: 11-24-1980, 35 y.o.   MRN: [id number] Chief Complaint  Patient presents with  . Follow-up   Patient presents for a three month lower extremity atherosclerotic disease follow up. The patient underwent an ABI which showed Right ABI: 1.14 and Left - not obtained due to bypass anastomosis, left great toe flat line. (on 05/20/16, Right ABI: 1.27 and Left [place]). 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 The patient does experience some LLE claudication like symptoms however denies any rest pain or ulcers to his feet / toes.    Review of Systems  Constitutional: Negative.   HENT: Negative.   Eyes: Negative.   Respiratory: Negative.   Cardiovascular:       LLE Claudication  Gastrointestinal: Negative.   Endocrine: Negative.   Genitourinary: Negative.   Musculoskeletal: Negative.   Skin: Negative.   Allergic/Immunologic: Negative.   Neurological: Negative.   Hematological: Negative.   Psychiatric/Behavioral: Negative.       Objective:   Physical Exam  Constitutional: He is oriented to person, place, and time. He appears well-developed and well-nourished.  HENT:  Head: Normocephalic and atraumatic.  Eyes: Conjunctivae and EOM are normal. Pupils are equal, round, and reactive to light.  Neck: Normal range of motion.  Cardiovascular: Normal rate, regular rhythm, normal heart sounds and intact distal pulses.   Pulses:      Radial pulses are 2+ on the right side, and 2+ on the left side.       Dorsalis pedis pulses are 2+ on the right side, and 0 on the left side.       Posterior tibial pulses are 2+ on the right side, and 0 on the left side.  Pulmonary/Chest: Effort normal and breath sounds normal.  Abdominal: Soft. Bowel sounds are normal.  Musculoskeletal: Normal range of motion.  He exhibits no edema.  Neurological: He is alert and oriented to person, place, and time.  Skin: Skin is warm and dry.  Psychiatric: He has a normal mood and affect. His behavior is normal. Judgment and thought content normal.   BP 140/89   Pulse (!) 43   Resp 16   Ht 6' (1.829 m)   Wt 188 lb (85.3 kg)   BMI 25.50 kg/m   Past Medical History:  Diagnosis Date  . Anxiety   . Arthritis   . Back pain   . Shingles    Social History   Social History  . Marital status: Single    Spouse name: N/A  . Number of children: N/A  . Years of education: N/A   Occupational History  . Not on file.   Social History Main Topics  . Smoking status: Former Smoker    Packs/day: 0.50    Years: 16.00    Types: Cigarettes  . Smokeless tobacco: Never Used  . Alcohol use No     Comment: rare  . Drug use: No  . Sexual activity: Not on file   Other Topics Concern  . Not on file   Social History Narrative  . No narrative on file   Past Surgical History:  Procedure Laterality Date  . BYPASS GRAFT POPLITEAL TO POPLITEAL Left 09/20/2015   Procedure: BYPASS GRAFT POPLITEAL TO PERONEAL;  Surgeon: Renford DillsGregory G Schnier, MD;  Location: [facility] ORS;  Service: Vascular;  Laterality: Left;  . EMBOLECTOMY Left 09/20/2015   Procedure: EMBOLECTOMY;  Surgeon: Renford DillsGregory G Schnier, MD;  Location: [facility] ORS;  Service: Vascular;  Laterality: Left;  . LUMBAR LAMINECTOMY/DECOMPRESSION MICRODISCECTOMY Right 12/29/2012   Procedure: SPINE: LAMINOTOMY DECOMPRESSION NERVE ROOT EXCISION HERNIATED DISK 1 SPACE LUMBAR;  Surgeon: Mat CarneSydney Michael Tooke, MD;  [facility];  Service: Orthopedics;  Laterality: Right;  RIGHT L5-S1 DISC EXCISION  . ORIF ANKLE FRACTURE Left 09/20/2015   Procedure: OPEN REDUCTION INTERNAL FIXATION (ORIF) ANKLE FRACTURE;  Surgeon: Kennedy BuckerMichael Menz, MD;  Location: [facility] ORS;  Service: Orthopedics;  Laterality: Left;  . PERIPHERAL VASCULAR CATHETERIZATION Left 03/21/2015   Procedure: Lower Extremity  Angiography;  Surgeon: Renford DillsGregory G Schnier, MD;  Location: [facility] INVASIVE CV LAB;  Service: Cardiovascular;  Laterality: Left;  . PERIPHERAL VASCULAR CATHETERIZATION  03/21/2015   Procedure: Lower Extremity Intervention;  Surgeon: Renford DillsGregory G Schnier, MD;  Location: [facility] CV [facility];  Service: Cardiovascular;;  . PERIPHERAL VASCULAR CATHETERIZATION Left 09/05/2015   Procedure: Lower Extremity Angiography;  Surgeon: Renford DillsGregory G Schnier, MD;  Location: [facility] CV LAB;  Service: Cardiovascular;  Laterality: Left;  . PERIPHERAL VASCULAR CATHETERIZATION  09/05/2015   Procedure: Lower Extremity Intervention;  Surgeon: Renford DillsGregory G Schnier, MD;  Location: [facility] IN[facility]ASIVE CV LAB;  Service: Cardiovascular;;  . TOOTH EXTRACTION    . WISDOM TOOTH EXTRACTION     History reviewed. No pertinent family history.  No Known Allergies     Assessment & Plan:  Patient presents for a three month lower extremity atherosclerotic disease follow up. The patient underwent an ABI which showed Right ABI: 1.14 and Left - not obtained due to bypass anastomosis, left great toe flat line. (on 05/20/16, Right ABI: 1.27 and Left [place]). 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 The patient does experience some LLE claudication like symptoms however denies any rest pain or ulcers to his feet / toes.   1. 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 Recommend LLE angiogram with stent placement to reduce size of aneurysm and restore blood flow distally. Procedure, risks and benefits explained to patient. All questions answered. Patient wishes to proceed.   2. 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   3. Tobacco dependence - Stable I have discussed (approximately 5 minutes) with the patient the role of tobacco in the pathogenesis of atherosclerosis and its effect on the progression of the disease, impact on the durability of interventions and its limitations on the formation of collateral pathways. I have recommended absolute tobacco cessation. I have discussed various options available for assistance with tobacco cessation including over the counter methods (Nicotine gum, patch and lozenges). We also discussed prescription options (Chantix, Nicotine Inhaler / Nasal Spray). The patient is not interested in pursuing any prescription tobacco cessation options at this time. The patient voices their understanding.   Current Outpatient Prescriptions on File Prior to Visit  Medication Sig Dispense Refill  . aspirin EC 81 MG EC tablet Take 1 tablet (81 mg total) by mouth daily. 30 tablet 6  . clopidogrel (PLAVIX) 75 MG tablet Take 1 tablet (75 mg total) by mouth daily. 30 tablet 6   No current facility-administered medications on file prior to visit.  There are no Patient Instructions on file for this visit. No Follow-up on file.  KIMBERLY A STEGMAYER, PA-C

## 2016-08-26 ENCOUNTER — Other Ambulatory Visit
Admit: 2016-08-26 | Discharge: 2016-08-26 | Disposition: A | Discharge status: Home / Self Care | Payer: Managed Care, Other (non HMO) | Attending: Vascular Surgery | Admitting: Vascular Surgery

## 2016-08-26 ENCOUNTER — Other Ambulatory Visit (INDEPENDENT_AMBULATORY_CARE_PROVIDER_SITE_OTHER): Payer: Self-pay | Admitting: Vascular Surgery

## 2016-08-26 DIAGNOSIS — I739 Peripheral vascular disease, unspecified: Secondary | ICD-10-CM | POA: Diagnosis not present

## 2016-08-26 LAB — CREATININE, SERUM
CREATININE: 1.04 mg/dL (ref 0.61–1.24)
GFR calc Af Amer: >60 mL/min (ref >60)
GFR calc non Af Amer: >60 mL/min (ref >60)

## 2016-08-26 LAB — BUN: BUN: 15 mg/dL (ref 6–20)

## 2016-08-27 ENCOUNTER — Encounter
Admission: RE | Disposition: A | Discharge status: Home / Self Care | Payer: Self-pay | Source: Ambulatory Visit | Attending: Vascular Surgery

## 2016-08-27 ENCOUNTER — Ambulatory Visit
Admission: RE | Admit: 2016-08-27 | Discharge: 2016-08-27 | Disposition: A | Discharge status: Home / Self Care | Payer: Managed Care, Other (non HMO) | Source: Ambulatory Visit | Attending: Vascular Surgery | Admitting: Vascular Surgery

## 2016-08-27 DIAGNOSIS — J340 Abscess, furuncle and carbuncle of nose: Secondary | ICD-10-CM | POA: Insufficient documentation

## 2016-08-27 DIAGNOSIS — I724 Aneurysm of artery of lower extremity: Secondary | ICD-10-CM | POA: Diagnosis not present

## 2016-08-27 DIAGNOSIS — Z7982 Long term (current) use of aspirin: Secondary | ICD-10-CM | POA: Diagnosis not present

## 2016-08-27 DIAGNOSIS — Z981 Arthrodesis status: Secondary | ICD-10-CM | POA: Insufficient documentation

## 2016-08-27 DIAGNOSIS — I70212 Atherosclerosis of native arteries of extremities with intermittent claudication, left leg: Secondary | ICD-10-CM

## 2016-08-27 DIAGNOSIS — Z951 Presence of aortocoronary bypass graft: Secondary | ICD-10-CM | POA: Insufficient documentation

## 2016-08-27 DIAGNOSIS — Z809 Family history of malignant neoplasm, unspecified: Secondary | ICD-10-CM | POA: Diagnosis not present

## 2016-08-27 DIAGNOSIS — I70213 Atherosclerosis of native arteries of extremities with intermittent claudication, bilateral legs: Secondary | ICD-10-CM | POA: Diagnosis not present

## 2016-08-27 DIAGNOSIS — Z8249 Family history of ischemic heart disease and other diseases of the circulatory system: Secondary | ICD-10-CM | POA: Diagnosis not present

## 2016-08-27 DIAGNOSIS — Z87891 Personal history of nicotine dependence: Secondary | ICD-10-CM | POA: Insufficient documentation

## 2016-08-27 HISTORY — PX: PERIPHERAL VASCULAR CATHETERIZATION: SHX172C

## 2016-08-27 SURGERY — LOWER EXTREMITY ANGIOGRAPHY
Anesthesia: Moderate Sedation | Site: Leg Lower | Laterality: Left

## 2016-08-27 MED ORDER — PANTOPRAZOLE SODIUM 40 MG PO TBEC: 40.0000 mg | DELAYED_RELEASE_TABLET | Freq: Every day | ORAL | Status: DC

## 2016-08-27 MED ORDER — CLOPIDOGREL BISULFATE 75 MG PO TABS
ORAL_TABLET | ORAL | Status: AC
  Administered 2016-08-27: 150 mg via ORAL
  Filled 2016-08-27: qty 2

## 2016-08-27 MED ORDER — HYDROCODONE-ACETAMINOPHEN 5-325 MG PO TABS: 1.0000 | ORAL_TABLET | Freq: Four times a day (QID) | ORAL | 0 refills | Status: DC | PRN

## 2016-08-27 MED ORDER — HYDROMORPHONE HCL 1 MG/ML IJ SOLN: 1.0000 mg | Freq: Once | INTRAMUSCULAR | Status: DC

## 2016-08-27 MED ORDER — HEPARIN SODIUM (PORCINE) 1000 UNIT/ML IJ SOLN
INTRAMUSCULAR | Status: DC | PRN
  Administered 2016-08-27: 5000 [IU] via INTRAVENOUS

## 2016-08-27 MED ORDER — LABETALOL HCL 5 MG/ML IV SOLN: 10.0000 mg | INTRAVENOUS | Status: DC | PRN

## 2016-08-27 MED ORDER — FENTANYL CITRATE (PF) 100 MCG/2ML IJ SOLN
INTRAMUSCULAR | Status: AC
  Filled 2016-08-27: qty 2

## 2016-08-27 MED ORDER — DIPHENHYDRAMINE HCL 50 MG/ML IJ SOLN
INTRAMUSCULAR | Status: DC | PRN
  Administered 2016-08-27: 50 mg via INTRAVENOUS

## 2016-08-27 MED ORDER — MIDAZOLAM HCL 5 MG/5ML IJ SOLN
INTRAMUSCULAR | Status: AC
  Filled 2016-08-27: qty 5

## 2016-08-27 MED ORDER — HYDRALAZINE HCL 20 MG/ML IJ SOLN: 5.0000 mg | INTRAMUSCULAR | Status: DC | PRN

## 2016-08-27 MED ORDER — SODIUM CHLORIDE 0.9 % IV SOLN
INTRAVENOUS | Status: DC
  Administered 2016-08-27: 11:00:00 via INTRAVENOUS

## 2016-08-27 MED ORDER — ONDANSETRON HCL 4 MG/2ML IJ SOLN: 4.0000 mg | Freq: Four times a day (QID) | INTRAMUSCULAR | Status: DC | PRN

## 2016-08-27 MED ORDER — MIDAZOLAM HCL 2 MG/2ML IJ SOLN
INTRAMUSCULAR | Status: DC | PRN
  Administered 2016-08-27: 2 mg via INTRAVENOUS
  Administered 2016-08-27 (×5): 1 mg via INTRAVENOUS

## 2016-08-27 MED ORDER — ACETAMINOPHEN 325 MG PO TABS: 325.0000 mg | ORAL_TABLET | ORAL | Status: DC | PRN

## 2016-08-27 MED ORDER — FENTANYL CITRATE (PF) 100 MCG/2ML IJ SOLN
INTRAMUSCULAR | Status: DC | PRN
  Administered 2016-08-27: 50 ug via INTRAVENOUS
  Administered 2016-08-27 (×3): 25 ug via INTRAVENOUS
  Administered 2016-08-27 (×2): 50 ug via INTRAVENOUS
  Administered 2016-08-27: 25 ug via INTRAVENOUS

## 2016-08-27 MED ORDER — HEPARIN SODIUM (PORCINE) 1000 UNIT/ML IJ SOLN
INTRAMUSCULAR | Status: AC
  Filled 2016-08-27: qty 1

## 2016-08-27 MED ORDER — METOPROLOL TARTRATE 5 MG/5ML IV SOLN: 5.0000 mg | Freq: Four times a day (QID) | INTRAVENOUS | Status: DC

## 2016-08-27 MED ORDER — LIDOCAINE HCL (PF) 1 % IJ SOLN
INTRAMUSCULAR | Status: AC
  Filled 2016-08-27: qty 30

## 2016-08-27 MED ORDER — ACETAMINOPHEN 325 MG RE SUPP: 325.0000 mg | RECTAL | Status: DC | PRN

## 2016-08-27 MED ORDER — MORPHINE SULFATE (PF) 4 MG/ML IV SOLN: 2.0000 mg | INTRAVENOUS | Status: DC | PRN

## 2016-08-27 MED ORDER — ALUM & MAG HYDROXIDE-SIMETH 200-200-20 MG/5ML PO SUSP: 15.0000 mL | ORAL | Status: DC | PRN

## 2016-08-27 MED ORDER — DOCUSATE SODIUM 100 MG PO CAPS: 100.0000 mg | ORAL_CAPSULE | Freq: Every day | ORAL | Status: DC

## 2016-08-27 MED ORDER — DEXTROSE 5 % IV SOLN
1.5000 g | INTRAVENOUS | Status: AC
  Administered 2016-08-27: 1.5 g via INTRAVENOUS

## 2016-08-27 MED ORDER — FENTANYL CITRATE (PF) 100 MCG/2ML IJ SOLN
INTRAMUSCULAR | Status: AC
  Filled 2016-08-27: qty 4

## 2016-08-27 MED ORDER — METHYLPREDNISOLONE SODIUM SUCC 125 MG IJ SOLR: 125.0000 mg | INTRAMUSCULAR | Status: DC | PRN

## 2016-08-27 MED ORDER — FAMOTIDINE 20 MG PO TABS: 40.0000 mg | ORAL_TABLET | ORAL | Status: DC | PRN

## 2016-08-27 MED ORDER — HEPARIN (PORCINE) IN NACL 2-0.9 UNIT/ML-% IJ SOLN
INTRAMUSCULAR | Status: AC
  Filled 2016-08-27: qty 1000

## 2016-08-27 MED ORDER — OXYCODONE HCL 5 MG PO TABS
5.0000 mg | ORAL_TABLET | ORAL | Status: DC | PRN
  Administered 2016-08-27: 5 mg via ORAL
  Filled 2016-08-27: qty 2

## 2016-08-27 MED ORDER — DIPHENHYDRAMINE HCL 50 MG/ML IJ SOLN
INTRAMUSCULAR | Status: AC
  Filled 2016-08-27: qty 1

## 2016-08-27 MED ORDER — MORPHINE SULFATE (PF) 2 MG/ML IV SOLN
INTRAVENOUS | Status: AC
  Administered 2016-08-27: 2 mg via INTRAVENOUS
  Filled 2016-08-27: qty 1

## 2016-08-27 MED ORDER — CLOPIDOGREL BISULFATE 75 MG PO TABS
150.0000 mg | ORAL_TABLET | ORAL | Status: AC
  Administered 2016-08-27: 150 mg via ORAL

## 2016-08-27 SURGICAL SUPPLY — 21 items
BALLN LUTONIX DCB 5X60X130 (BALLOONS) ×3
BALLN ULTRVRSE 6X40X130 (BALLOONS) ×2
BALLN ULTRVRSE 6X40X130 OTE (BALLOONS) ×1
BALLOON LUTONIX DCB 5X60X130 (BALLOONS) ×1 IMPLANT
BALLOON ULTRVRSE 6X40X130 OTE (BALLOONS) ×1 IMPLANT
CATH GWIRE MARINER STRGHT 4FR (CATHETERS) ×6 IMPLANT
CATH PIG 70CM (CATHETERS) ×3 IMPLANT
DEVICE PRESTO INFLATION (MISCELLANEOUS) ×3 IMPLANT
DEVICE STARCLOSE SE CLOSURE (Vascular Products) ×3 IMPLANT
GLIDEWIRE ANGLED SS 035X260CM (WIRE) ×3 IMPLANT
PACK ANGIOGRAPHY (CUSTOM PROCEDURE TRAY) ×3 IMPLANT
SET INTRO CAPELLA COAXIAL (SET/KITS/TRAYS/PACK) ×3 IMPLANT
SHEATH BRITE TIP 5FRX11 (SHEATH) ×3 IMPLANT
SHEATH RAABE 7FR (SHEATH) ×3 IMPLANT
STENT VIABAHN 6X50X120 (Permanent Stent) ×3 IMPLANT
SYR MEDRAD MARK V 150ML (SYRINGE) ×3 IMPLANT
TUBING CONTRAST HIGH PRESS 72 (TUBING) ×3 IMPLANT
VALVE HEMO TOUHY BORST Y (VALVE) ×3 IMPLANT
WIRE G V18X300CM (WIRE) ×6 IMPLANT
WIRE G.018X260 SHT (WIRE) ×3 IMPLANT
WIRE J 3MM .035X145CM (WIRE) ×3 IMPLANT

## 2016-08-27 NOTE — H&P (Signed)
[facility] History & Physical Update  The patient was interviewed and re-examined.  The patient's previous History and Physical has been reviewed and is unchanged.  There is no change in the plan of care. We plan to proceed with the scheduled procedure.  Levora DredgeGregory Azadeh Hyder, MD  08/27/2016, 11:43 AM

## 2016-08-27 NOTE — Progress Notes (Signed)
Pt here today for lower extremity angiogram, vitals stable, alert and oriented, will continue to monitor vitals as well as etco2 during enitire procedure, as moderate sedation being given, sr per monitor,

## 2016-08-27 NOTE — Progress Notes (Signed)
Pt procedure done, tolerated well, remained clinicallly stable entire procedure. Will transfer pt to recovery to finish recovery prior to discharge.

## 2016-08-27 NOTE — Op Note (Signed)
[facility] Percutaneous Study/Intervention Procedural Note   Date of Surgery: 08/27/2016  Surgeon:  Katha Cabal, MD.  Pre-operative Diagnosis:  1.  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  Pseudoaneurysm mid popliteal left lower extremity  Post-operative diagnosis: Same  Procedure(s) Performed: 1. Introduction catheter into left lower extremity 3rd order catheter placement  2. Contrast injection left lower extremity for distal runoff   3. Percutaneous transluminal angioplasty and stent placement left popliteal 4. Star close closure right common femoral arteriotomy  Anesthesia: Conscious sedation was administered under my direct supervision. IV Versed plus fentanyl were utilized. Continuous ECG, pulse oximetry and blood pressure was monitored throughout the entire procedure. Conscious sedation was for a total of 55 minutes.  Sheath: 7 French Rabi right common femoral artery  Contrast: 90 cc  Fluoroscopy Time: 10.9 minutes  Indications: Harold [name] presents with an expanding pseudoaneurysm of the popliteal artery left lower extremity associated with severe distal atherosclerotic occlusive disease. The patient has been having increased leg pain on the left as well as a marked reduction in his claudication distance. The risks and benefits are reviewed all questions answered patient agrees to proceed.  Procedure: Harold [name] is a 35 y.o. y.o. male who was identified and appropriate procedural time out was performed. The patient was then placed supine on the table and prepped and draped in the usual sterile fashion.   Ultrasound was placed in the sterile sleeve and the right groin was evaluated the right common femoral artery was echolucent and pulsatile indicating patency.  Image was recorded for the permanent record and under real-time  visualization a microneedle was inserted into the common femoral artery microwire followed by a micro-sheath.  A J-wire was then advanced through the micro-sheath and a  5 [place] sheath was then inserted over a J-wire. J-wire was then advanced and a 5 French pigtail catheter was positioned at the level of T12. AP projection of the aorta was then obtained. Pigtail catheter was repositioned to above the bifurcation and a RAO view of the pelvis was obtained.  Subsequently a pigtail catheter with the stiff angle Glidewire was used to cross the aortic bifurcation the catheter wire were advanced down into the left distal external iliac artery. Oblique view of the femoral bifurcation was then obtained and subsequently the wire was reintroduced and the pigtail catheter negotiated into the SFA representing third order catheter placement. Distal runoff was then performed.  5000 units of heparin was then given and allowed to circulate and a 7 [place] Rabi sheath was advanced up and over the bifurcation and positioned in the proximal superficial femoral artery  KMP  catheter and stiff angle Glidewire were then negotiated down into the distal popliteal.  Distal runoff was then completed by hand injection through the catheter. The wire was then reintroduced and negotiated into the anterior tibial artery. Subsequently, a 5 x 60 Lutonix balloon was used to angioplasty the popliteal artery. Inflations were to 12 atmospheres for 2 minutes. Follow-up imaging demonstrated patency with adequate preparation of the vessel for a covered stent.  Subsequently, a 6 x 50 Viabahn stent was deployed and subsequently postdilated with a 6 x 40 oh traverse balloon. Angioplasty was to 10 atm for approximately 20 seconds. Distal runoff was then reassessed.  After review of these images the sheath is pulled into the right external iliac oblique of the common femoral is obtained and a Star close device deployed. There no immediate  complications.   Findings: The abdominal aorta is opacified with a bolus injection contrast. Renal arteries are widely patent. The aorta itself has diffuse disease but no hemodynamically significant lesions. The common and external iliac arteries are widely patent bilaterally.  The left common femoral is widely patent as is the profunda femoris.  The SFA is widely patent. However, the popliteal does indeed have a significant stenosis of greater than 80% in its midportion and then just distal to this is a large pseudoaneurysm. The previous popliteal to peroneal bypass appears to be occluded. The previous interventions and stents within the tibioperoneal trunk remain occluded as previously documented. The anterior tibial is patent throughout its proximal three fourths. Approximately one third of the way down a 2 mm collateral branches from the anterior tibial and communicates erectly to the distal peroneal which then has extensive collaterals to the foot and fills the pedal arch. Dorsalis pedis and distal anterior tibial artery completely nonvisualized.  The mid popliteal demonstrates increasing disease with a 80% stenosis as described above and this is associated with a large pseudoaneurysm. After placement of the Viabahn stent and post dilatation with a 6 mm balloon is complete resolution of any narrowing and the pseudoaneurysms completely sealed.    Summary: Successful recanalization of the left lower extremity for limb salvage associated with successful treatment of the popliteal pseudoaneurysm.    Disposition: Patient was taken to the recovery room in stable condition having tolerated the procedure well.  Schnier, Dolores Lory 08/27/2016,1:21 PM

## 2016-08-27 NOTE — Discharge Instructions (Signed)

## 2016-08-28 ENCOUNTER — Encounter: Payer: Self-pay | Admitting: Vascular Surgery

## 2016-09-16 ENCOUNTER — Encounter (INDEPENDENT_AMBULATORY_CARE_PROVIDER_SITE_OTHER): Payer: Managed Care, Other (non HMO)

## 2016-09-16 ENCOUNTER — Ambulatory Visit (INDEPENDENT_AMBULATORY_CARE_PROVIDER_SITE_OTHER): Payer: Managed Care, Other (non HMO) | Admitting: Vascular Surgery

## 2016-09-19 ENCOUNTER — Ambulatory Visit (INDEPENDENT_AMBULATORY_CARE_PROVIDER_SITE_OTHER): Payer: Managed Care, Other (non HMO)

## 2016-09-19 ENCOUNTER — Other Ambulatory Visit (INDEPENDENT_AMBULATORY_CARE_PROVIDER_SITE_OTHER): Payer: Self-pay | Admitting: Vascular Surgery

## 2016-09-19 ENCOUNTER — Ambulatory Visit (INDEPENDENT_AMBULATORY_CARE_PROVIDER_SITE_OTHER): Payer: Managed Care, Other (non HMO) | Admitting: Vascular Surgery

## 2016-09-19 ENCOUNTER — Encounter (INDEPENDENT_AMBULATORY_CARE_PROVIDER_SITE_OTHER): Payer: Self-pay | Admitting: Vascular Surgery

## 2016-09-19 VITALS — BP 130/80 | HR 62 | Resp 17 | Ht 72.0 in | Wt 188.0 lb

## 2016-09-19 DIAGNOSIS — I70203 Unspecified atherosclerosis of native arteries of extremities, bilateral legs: Secondary | ICD-10-CM

## 2016-09-19 DIAGNOSIS — I731 Thromboangiitis obliterans [Buerger's disease]: Secondary | ICD-10-CM | POA: Diagnosis not present

## 2016-09-19 DIAGNOSIS — I70312 Atherosclerosis of unspecified type of bypass graft(s) of the extremities with intermittent claudication, left leg: Secondary | ICD-10-CM

## 2016-09-19 DIAGNOSIS — I739 Peripheral vascular disease, unspecified: Secondary | ICD-10-CM

## 2016-09-22 NOTE — Progress Notes (Signed)
MRN : [id number]  Harold [name] is a 35 y.o. (May 27, 1981) male who presents with chief complaint of  Chief Complaint  Patient presents with  . Follow-up  .  History of Present Illness: The patient returns to the office for followup and review of the noninvasive studies.  He is s/p PTA and stenting of the popliteal pseudoaneurysm. There have been no interval changes in lower extremity symptoms. No interval shortening of the patient's claudication distance or development of rest pain symptoms. No new ulcers or wounds have occurred since the last visit.  There have been no significant changes to the patient's overall health care.  The patient denies amaurosis fugax or recent TIA symptoms. There are no recent neurological changes noted. The patient denies history of DVT, PE or superficial thrombophlebitis. The patient denies recent episodes of angina or shortness of breath.   Duplex ultrasound shows a patent popliteal artery stent  Current Meds  Medication Sig  . acetaminophen (TYLENOL) 500 MG tablet Take 1,000 mg by mouth daily as needed for moderate pain or headache.  Marland Kitchen aspirin EC 81 MG EC tablet Take 1 tablet (81 mg total) by mouth daily.  . clopidogrel (PLAVIX) 75 MG tablet Take 1 tablet (75 mg total) by mouth daily.  Marland Kitchen escitalopram (LEXAPRO) 20 MG tablet Take 20 mg by mouth daily.     Past Medical History:  Diagnosis Date  . Anxiety   . Arthritis   . Back pain   . Shingles     Past Surgical History:  Procedure Laterality Date  . BYPASS GRAFT POPLITEAL TO POPLITEAL Left 09/20/2015   Procedure: BYPASS GRAFT POPLITEAL TO PERONEAL;  Surgeon: Renford Dills, MD;  Location: [facility];  Service: Vascular;  Laterality: Left;  . EMBOLECTOMY Left 09/20/2015   Procedure: EMBOLECTOMY;  Surgeon: Renford Dills, MD;  Location: [facility];  Service: Vascular;  Laterality: Left;  . LUMBAR LAMINECTOMY/DECOMPRESSION MICRODISCECTOMY Right 12/29/2012   Procedure: SPINE: LAMINOTOMY  DECOMPRESSION NERVE ROOT EXCISION HERNIATED DISK 1 SPACE LUMBAR;  Surgeon: Mat Carne, MD;  Location: [facility] OR;  Service: Orthopedics;  Laterality: Right;  RIGHT L5-S1 DISC EXCISION  . ORIF ANKLE FRACTURE Left 09/20/2015   Procedure: OPEN REDUCTION INTERNAL FIXATION (ORIF) ANKLE FRACTURE;  Surgeon: Kennedy Bucker, MD;  Location: [facility];  Service: Orthopedics;  Laterality: Left;  . PERIPHERAL VASCULAR CATHETERIZATION Left 03/21/2015   Procedure: Lower Extremity Angiography;  Surgeon: Renford Dills, MD;  Location: [facility];  Service: Cardiovascular;  Laterality: Left;  . PERIPHERAL VASCULAR CATHETERIZATION  03/21/2015   Procedure: Lower Extremity Intervention;  Surgeon: Renford Dills, MD;  Location: [facility];  Service: Cardiovascular;;  . PERIPHERAL VASCULAR CATHETERIZATION Left 09/05/2015   Procedure: Lower Extremity Angiography;  Surgeon: Renford Dills, MD;  Location: [facility];  Service: Cardiovascular;  Laterality: Left;  . PERIPHERAL VASCULAR CATHETERIZATION  09/05/2015   Procedure: Lower Extremity Intervention;  Surgeon: Renford Dills, MD;  Location: [facility];  Service: Cardiovascular;;  . PERIPHERAL VASCULAR CATHETERIZATION Left 08/27/2016   Procedure: Lower Extremity Angiography;  Surgeon: Renford Dills, MD;  Location: [facility];  Service: Cardiovascular;  Laterality: Left;  . TOOTH EXTRACTION    . WISDOM TOOTH EXTRACTION      Social History Social History  Substance Use Topics  . Smoking status: Former Smoker    Packs/day: 0.50    Years: 16.00    Types: Cigarettes  . Smokeless tobacco: Never  Used  . Alcohol use No     Comment: rare    Family History No family history on file. No family history of bleeding/clotting disorders, porphyria or autoimmune disease   No Known Allergies   REVIEW OF SYSTEMS (Negative unless checked)  Constitutional: [] Weight loss  [] Fever  [] Chills Cardiac: [] Chest  pain   [] Chest pressure   [] Palpitations   [] Shortness of breath when laying flat   [] Shortness of breath with exertion. Vascular:  [x] Pain in legs with walking   [] Pain in legs at rest  [] History of DVT   [] Phlebitis   [] Swelling in legs   [] Varicose veins   [] Non-healing ulcers Pulmonary:   [] Uses home oxygen   [] Productive cough   [] Hemoptysis   [] Wheeze  [] COPD   [] Asthma Neurologic:  [] Dizziness   [] Seizures   [] History of stroke   [] History of TIA  [] Aphasia   [] Vissual changes   [] Weakness or numbness in arm   [] Weakness or numbness in leg Musculoskeletal:   [] Joint swelling   [] Joint pain   [] Low back pain Hematologic:  [] Easy bruising  [] Easy bleeding   [] Hypercoagulable state   [] Anemic Gastrointestinal:  [] Diarrhea   [] Vomiting  [] Gastroesophageal reflux/heartburn   [] Difficulty swallowing. Genitourinary:  [] Chronic kidney disease   [] Difficult urination  [] Frequent urination   [] Blood in urine Skin:  [] Rashes   [] Ulcers  Psychological:  [] History of anxiety   []  History of major depression.  Physical Examination  Vitals:   09/19/16 1524  BP: 130/80  Pulse: 62  Resp: 17  Weight: 188 lb (85.3 kg)  Height: 6' (1.829 m)   Body mass index is 25.5 kg/m. Gen: WD/WN, NAD Head: [place]/AT, No temporalis wasting.  Ear/Nose/Throat: Hearing grossly intact, nares w/o erythema or drainage, poor dentition Eyes: PER, EOMI, sclera nonicteric.  Neck: Supple, no masses.  No bruit or JVD.  Pulmonary:  Good air movement, clear to auscultation bilaterally, no use of accessory muscles.  Cardiac: RRR, normal S1, S2, no Murmurs. Vascular:  Left toes cool to touch with 2-3 second cap refill Vessel Right Left  Radial Palpable Palpable  Ulnar Palpable Palpable  Brachial Palpable Palpable  Carotid Palpable Palpable  Femoral Palpable Palpable  Popliteal Palpable Not Palpable  PT Palpable Not Palpable  DP Palpable Not Palpable   Gastrointestinal: soft, non-distended. No guarding/no peritoneal signs.   Musculoskeletal: M/S 5/5 throughout.  No deformity or atrophy.  Neurologic: CN 2-12 intact. Pain and light touch intact in extremities.  Symmetrical.  Speech is fluent. Motor exam as listed above. Psychiatric: Judgment intact, Mood & affect appropriate for pt's clinical situation. Dermatologic: No rashes or ulcers noted.  No changes consistent with cellulitis. Lymph : No Cervical lymphadenopathy, no lichenification or skin changes of chronic lymphedema.  CBC Lab Results  Component Value Date   WBC 12.2 (H) 09/21/2015   HGB 11.9 (L) 09/21/2015   HCT 36.2 (L) 09/21/2015   MCV 89.3 09/21/2015   PLT 241 09/21/2015    BMET    Component Value Date/Time   NA 140 09/21/2015 0801   K 4.4 09/21/2015 0801   CL 104 09/21/2015 0801   CO2 30 09/21/2015 0801   GLUCOSE 108 (H) 09/21/2015 0801   BUN 15 08/26/2016 1645   CREATININE 1.04 08/26/2016 1645   CREATININE 1.03 09/18/2015 1601   CALCIUM 8.5 (L) 09/21/2015 0801   GFRNONAA >60 08/26/2016 1645   GFRAA >60 08/26/2016 1645   CrCl cannot be calculated (Patient's most recent lab result is older than the  maximum 21 days allowed.).  COAG Lab Results  Component Value Date   INR 0.95 09/18/2015   INR 1.01 12/28/2012    Radiology No results found.  Assessment/Plan 1. 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  The patient does not voice lifestyle limiting changes at this point in time.  Noninvasive studies do not suggest clinically significant change.  No invasive studies, angiography or surgery at this time The patient should continue walking and begin a more formal exercise program.  The patient should continue antiplatelet therapy and aggressive treatment of the lipid abnormalities  No changes in the patient's medications at this time  The patient should continue wearing  graduated compression socks 10-15 mmHg strength to control the mild edema.    - VAS US ABI WITH/WO TBI; Future - VAS US LOWER EXTREMITY ARTERIAL DUPLEX; Future  2. Thromboangiitis obliterans (Buerger's disease) (HCC) See #1 - VAS US ABI WITH/WO TBI; Future - VAS US LOWER EXTREMITY ARTERIAL DUPLEX; Future  3. PVD (peripheral vascular disease) (HCC) See #1    Levora DredgeGregory Caleel Kiner, MD  09/22/2016 9:13 PM

## 2016-12-26 ENCOUNTER — Ambulatory Visit (INDEPENDENT_AMBULATORY_CARE_PROVIDER_SITE_OTHER): Payer: Commercial Managed Care - PPO

## 2016-12-26 ENCOUNTER — Ambulatory Visit (INDEPENDENT_AMBULATORY_CARE_PROVIDER_SITE_OTHER): Payer: Commercial Managed Care - PPO | Admitting: Vascular Surgery

## 2016-12-26 ENCOUNTER — Encounter (INDEPENDENT_AMBULATORY_CARE_PROVIDER_SITE_OTHER): Payer: Self-pay | Admitting: Vascular Surgery

## 2016-12-26 VITALS — BP 150/90 | HR 75 | Resp 17 | Wt 203.0 lb

## 2016-12-26 DIAGNOSIS — I731 Thromboangiitis obliterans [Buerger's disease]: Secondary | ICD-10-CM | POA: Diagnosis not present

## 2016-12-26 DIAGNOSIS — I70312 Atherosclerosis of unspecified type of bypass graft(s) of the extremities with intermittent claudication, left leg: Secondary | ICD-10-CM

## 2016-12-26 NOTE — Progress Notes (Signed)
MRN : [id number]  Harold [name] is a 36 y.o. ([birth year]) male who presents with chief complaint of  Chief Complaint  Patient presents with  . Follow-up  .  History of Present Illness: The patient returns to the office for followup and review of the noninvasive studies.  He is s/p PTA and stenting of the popliteal pseudoaneurysm. There have been no interval changes in lower extremity symptoms. No interval shortening of the patient's claudication distance or development of rest pain symptoms. No new ulcers or wounds have occurred since the last visit.  There have been no significant changes to the patient's overall health care.  The patient denies amaurosis fugax or recent TIA symptoms. There are no recent neurological changes noted. The patient denies history of DVT, PE or superficial thrombophlebitis. The patient denies recent episodes of angina or shortness of breath.   Previous duplex ultrasound shows a patent popliteal artery stent  Current Meds  Medication Sig  . acetaminophen (TYLENOL) 500 MG tablet Take 1,000 mg by mouth daily as needed for moderate pain or headache.  Marland Kitchen aspirin EC 81 MG EC tablet Take 1 tablet (81 mg total) by mouth daily.  . clopidogrel (PLAVIX) 75 MG tablet Take 1 tablet (75 mg total) by mouth daily.  Marland Kitchen escitalopram (LEXAPRO) 20 MG tablet Take 20 mg by mouth daily.     Past Medical History:  Diagnosis Date  . Anxiety   . Arthritis   . Back pain   . Shingles     Past Surgical History:  Procedure Laterality Date  . BYPASS GRAFT POPLITEAL TO POPLITEAL Left 09/20/2015   Procedure: BYPASS GRAFT POPLITEAL TO PERONEAL;  Surgeon: Renford Dills, MD;  Location: [facility];  Service: Vascular;  Laterality: Left;  . EMBOLECTOMY Left 09/20/2015   Procedure: EMBOLECTOMY;  Surgeon: Renford Dills, MD;  Location: [facility];  Service: Vascular;  Laterality: Left;  . LUMBAR LAMINECTOMY/DECOMPRESSION MICRODISCECTOMY Right 12/29/2012   Procedure: SPINE:  LAMINOTOMY DECOMPRESSION NERVE ROOT EXCISION HERNIATED DISK 1 SPACE LUMBAR;  Surgeon: Mat Carne, MD;  Location: [facility] OR;  Service: Orthopedics;  Laterality: Right;  RIGHT L5-S1 DISC EXCISION  . ORIF ANKLE FRACTURE Left 09/20/2015   Procedure: OPEN REDUCTION INTERNAL FIXATION (ORIF) ANKLE FRACTURE;  Surgeon: Kennedy Bucker, MD;  Location: [facility];  Service: Orthopedics;  Laterality: Left;  . PERIPHERAL VASCULAR CATHETERIZATION Left 03/21/2015   Procedure: Lower Extremity Angiography;  Surgeon: Renford Dills, MD;  Location: [facility];  Service: Cardiovascular;  Laterality: Left;  . PERIPHERAL VASCULAR CATHETERIZATION  03/21/2015   Procedure: Lower Extremity Intervention;  Surgeon: Renford Dills, MD;  Location: [facility];  Service: Cardiovascular;;  . PERIPHERAL VASCULAR CATHETERIZATION Left 09/05/2015   Procedure: Lower Extremity Angiography;  Surgeon: Renford Dills, MD;  Location: [facility];  Service: Cardiovascular;  Laterality: Left;  . PERIPHERAL VASCULAR CATHETERIZATION  09/05/2015   Procedure: Lower Extremity Intervention;  Surgeon: Renford Dills, MD;  Location: [facility];  Service: Cardiovascular;;  . PERIPHERAL VASCULAR CATHETERIZATION Left 08/27/2016   Procedure: Lower Extremity Angiography;  Surgeon: Renford Dills, MD;  Location: [facility];  Service: Cardiovascular;  Laterality: Left;  . TOOTH EXTRACTION    . WISDOM TOOTH EXTRACTION      Social History Social History  Substance Use Topics  . Smoking status: Former Smoker    Packs/day: 0.50    Years: 16.00    Types: Cigarettes  . Smokeless tobacco:  Never Used  . Alcohol use No     Comment: rare    Family History No family history on file. No family history of bleeding/clotting disorders, porphyria or autoimmune disease  No Known Allergies   REVIEW OF SYSTEMS (Negative unless checked)  Constitutional: [] Weight loss  [] Fever  [] Chills Cardiac:  [] Chest pain   [] Chest pressure   [] Palpitations   [] Shortness of breath when laying flat   [] Shortness of breath with exertion. Vascular:  [x] Pain in legs with walking   [] Pain in legs at rest  [] History of DVT   [] Phlebitis   [] Swelling in legs   [] Varicose veins   [] Non-healing ulcers Pulmonary:   [] Uses home oxygen   [] Productive cough   [] Hemoptysis   [] Wheeze  [] COPD   [] Asthma Neurologic:  [] Dizziness   [] Seizures   [] History of stroke   [] History of TIA  [] Aphasia   [] Vissual changes   [] Weakness or numbness in arm   [] Weakness or numbness in leg Musculoskeletal:   [] Joint swelling   [] Joint pain   [] Low back pain Hematologic:  [] Easy bruising  [] Easy bleeding   [] Hypercoagulable state   [] Anemic Gastrointestinal:  [] Diarrhea   [] Vomiting  [] Gastroesophageal reflux/heartburn   [] Difficulty swallowing. Genitourinary:  [] Chronic kidney disease   [] Difficult urination  [] Frequent urination   [] Blood in urine Skin:  [] Rashes   [] Ulcers  Psychological:  [] History of anxiety   []  History of major depression.  Physical Examination  Vitals:   12/26/16 1502  BP: (!) 150/90  Pulse: 75  Resp: 17  Weight: 203 lb (92.1 kg)   Body mass index is 27.53 kg/m. Gen: WD/WN, NAD Head: [place]/AT, No temporalis wasting.  Ear/Nose/Throat: Hearing grossly intact, nares w/o erythema or drainage, poor dentition Eyes: PER, EOMI, sclera nonicteric.  Neck: Supple, no masses.  No bruit or JVD.  Pulmonary:  Good air movement, clear to auscultation bilaterally, no use of accessory muscles.  Cardiac: RRR, normal S1, S2, no Murmurs. Vascular:  Left toes cool to touch with 2-3 second cap refill Vessel Right Left  Radial Palpable Palpable  Ulnar Palpable Palpable  Brachial Palpable Palpable  Carotid Palpable Palpable  Femoral Palpable Palpable  Popliteal Palpable Not Palpable  PT Palpable Not Palpable  DP Palpable Not Palpable  Gastrointestinal: soft, non-distended. No guarding/no peritoneal signs.    Musculoskeletal: M/S 5/5 throughout.  No deformity or atrophy.  Neurologic: CN 2-12 intact. Pain and light touch intact in extremities.  Symmetrical.  Speech is fluent. Motor exam as listed above. Psychiatric: Judgment intact, Mood & affect appropriate for pt's clinical situation. Dermatologic: No rashes or ulcers noted.  No changes consistent with cellulitis. Lymph : No Cervical lymphadenopathy, no lichenification or skin changes of chronic lymphedema.  CBC Lab Results  Component Value Date   WBC 12.2 (H) 09/21/2015   HGB 11.9 (L) 09/21/2015   HCT 36.2 (L) 09/21/2015   MCV 89.3 09/21/2015   PLT 241 09/21/2015    BMET    Component Value Date/Time   NA 140 09/21/2015 0801   K 4.4 09/21/2015 0801   CL 104 09/21/2015 0801   CO2 30 09/21/2015 0801   GLUCOSE 108 (H) 09/21/2015 0801   BUN 15 08/26/2016 1645   CREATININE 1.04 08/26/2016 1645   CREATININE 1.03 09/18/2015 1601   CALCIUM 8.5 (L) 09/21/2015 0801   GFRNONAA >60 08/26/2016 1645   GFRAA >60 08/26/2016 1645   CrCl cannot be calculated (Patient's most recent lab result is older than the maximum 21 days allowed.).  COAG Lab Results  Component Value Date   INR 0.95 09/18/2015   INR 1.01 12/28/2012    Radiology No results found.  Assessment/Plan 1. 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  The patient does not voice lifestyle limiting changes at this point in time.  Noninvasive studies do not suggest clinically significant change.  No invasive studies, angiography or surgery at this time The patient should continue walking and begin a more formal exercise program.  The patient should continue antiplatelet therapy and aggressive treatment of the lipid abnormalities  No changes in the patient's medications at this time  The patient should continue wearing  graduated compression socks 10-15 mmHg strength to control the mild edema.   - VAS US ABI WITH/WO TBI; Future - VAS US LOWER EXTREMITY ARTERIAL DUPLEX; Future  2. Thromboangiitis obliterans (Buerger's disease) (HCC) Continued smoking cessation was stressed    Levora DredgeGregory Gianne Shugars, MD  12/26/2016 5:23 PM

## 2017-03-06 ENCOUNTER — Encounter (INDEPENDENT_AMBULATORY_CARE_PROVIDER_SITE_OTHER): Payer: Self-pay | Admitting: Vascular Surgery

## 2017-03-06 ENCOUNTER — Other Ambulatory Visit (INDEPENDENT_AMBULATORY_CARE_PROVIDER_SITE_OTHER): Payer: Commercial Managed Care - PPO

## 2017-03-06 ENCOUNTER — Ambulatory Visit (INDEPENDENT_AMBULATORY_CARE_PROVIDER_SITE_OTHER): Payer: Commercial Managed Care - PPO | Admitting: Vascular Surgery

## 2017-03-06 VITALS — BP 148/96 | HR 69 | Resp 16 | Ht 72.0 in | Wt 198.0 lb

## 2017-03-06 DIAGNOSIS — E7800 Pure hypercholesterolemia, unspecified: Secondary | ICD-10-CM | POA: Diagnosis not present

## 2017-03-06 DIAGNOSIS — M79605 Pain in left leg: Secondary | ICD-10-CM | POA: Diagnosis not present

## 2017-03-06 DIAGNOSIS — I731 Thromboangiitis obliterans [Buerger's disease]: Secondary | ICD-10-CM

## 2017-03-06 DIAGNOSIS — I70312 Atherosclerosis of unspecified type of bypass graft(s) of the extremities with intermittent claudication, left leg: Secondary | ICD-10-CM

## 2017-03-06 DIAGNOSIS — M79606 Pain in leg, unspecified: Secondary | ICD-10-CM | POA: Insufficient documentation

## 2017-03-06 NOTE — Progress Notes (Signed)
MRN : [id number]  Harold [name] is a 36 y.o. (10-03-1981) male who presents with chief complaint of  Chief Complaint  Patient presents with  . Re-evaluation    Leg pain(left)  .  History of Present Illness: The patient returns to the office for followup regarding acute onset of left leg pain.   There has been a significant deterioration in the lower extremity symptoms.  The patient notes interval shortening of their claudication distance and development of mild rest pain symptoms. The pain is mostly in the foot and ankle area.  No new ulcers or wounds have occurred since the last visit.  There have been no significant changes to the patient's overall health care.  The patient denies amaurosis fugax or recent TIA symptoms. There are no recent neurological changes noted. The patient denies history of DVT, PE or superficial thrombophlebitis. The patient denies recent episodes of angina or shortness of breath.    Current Meds  Medication Sig  . acetaminophen (TYLENOL) 500 MG tablet Take 1,000 mg by mouth daily as needed for moderate pain or headache.  Marland Kitchen aspirin EC 81 MG EC tablet Take 1 tablet (81 mg total) by mouth daily.  Marland Kitchen atorvastatin (LIPITOR) 40 MG tablet Take 40 mg by mouth.  . clopidogrel (PLAVIX) 75 MG tablet Take 1 tablet (75 mg total) by mouth daily.  Marland Kitchen escitalopram (LEXAPRO) 20 MG tablet Take 20 mg by mouth daily.   Marland Kitchen HYDROcodone-acetaminophen (NORCO) 5-325 MG tablet Take 1-2 tablets by mouth every 6 (six) hours as needed for moderate pain.  . Oxycodone HCl 20 MG TABS Take 20 mg by mouth daily as needed (pain).     Past Medical History:  Diagnosis Date  . Anxiety   . Arthritis   . Back pain   . Shingles     Past Surgical History:  Procedure Laterality Date  . BYPASS GRAFT POPLITEAL TO POPLITEAL Left 09/20/2015   Procedure: BYPASS GRAFT POPLITEAL TO PERONEAL;  Surgeon: Renford Dills, MD;  Location: [facility];  Service: Vascular;  Laterality: Left;  .  EMBOLECTOMY Left 09/20/2015   Procedure: EMBOLECTOMY;  Surgeon: Renford Dills, MD;  Location: [facility];  Service: Vascular;  Laterality: Left;  . LUMBAR LAMINECTOMY/DECOMPRESSION MICRODISCECTOMY Right 12/29/2012   Procedure: SPINE: LAMINOTOMY DECOMPRESSION NERVE ROOT EXCISION HERNIATED DISK 1 SPACE LUMBAR;  Surgeon: Mat Carne, MD;  Location: [facility] OR;  Service: Orthopedics;  Laterality: Right;  RIGHT L5-S1 DISC EXCISION  . ORIF ANKLE FRACTURE Left 09/20/2015   Procedure: OPEN REDUCTION INTERNAL FIXATION (ORIF) ANKLE FRACTURE;  Surgeon: Kennedy Bucker, MD;  Location: [facility];  Service: Orthopedics;  Laterality: Left;  . PERIPHERAL VASCULAR CATHETERIZATION Left 03/21/2015   Procedure: Lower Extremity Angiography;  Surgeon: Renford Dills, MD;  Location: [facility];  Service: Cardiovascular;  Laterality: Left;  . PERIPHERAL VASCULAR CATHETERIZATION  03/21/2015   Procedure: Lower Extremity Intervention;  Surgeon: Renford Dills, MD;  Location: [facility];  Service: Cardiovascular;;  . PERIPHERAL VASCULAR CATHETERIZATION Left 09/05/2015   Procedure: Lower Extremity Angiography;  Surgeon: Renford Dills, MD;  Location: [facility];  Service: Cardiovascular;  Laterality: Left;  . PERIPHERAL VASCULAR CATHETERIZATION  09/05/2015   Procedure: Lower Extremity Intervention;  Surgeon: Renford Dills, MD;  Location: [facility];  Service: Cardiovascular;;  . PERIPHERAL VASCULAR CATHETERIZATION Left 08/27/2016   Procedure: Lower Extremity Angiography;  Surgeon: Renford Dills, MD;  Location: [facility];  Service:  Cardiovascular;  Laterality: Left;  . TOOTH EXTRACTION    . WISDOM TOOTH EXTRACTION      Social History Social History  Substance Use Topics  . Smoking status: Former Smoker    Packs/day: 0.50    Years: 16.00    Types: Cigarettes  . Smokeless tobacco: Never Used  . Alcohol use No     Comment: rare    Family History No  family history on file.  No Known Allergies   REVIEW OF SYSTEMS (Negative unless checked)  Constitutional: [] Weight loss  [] Fever  [] Chills Cardiac: [] Chest pain   [] Chest pressure   [] Palpitations   [] Shortness of breath when laying flat   [] Shortness of breath with exertion. Vascular:  [x] Pain in legs with walking   [x] Pain in legs at rest  [] History of DVT   [] Phlebitis   [x] Swelling in legs   [] Varicose veins   [] Non-healing ulcers Pulmonary:   [] Uses home oxygen   [] Productive cough   [] Hemoptysis   [] Wheeze  [] COPD   [] Asthma Neurologic:  [] Dizziness   [] Seizures   [] History of stroke   [] History of TIA  [] Aphasia   [] Vissual changes   [] Weakness or numbness in arm   [] Weakness or numbness in leg Musculoskeletal:   [] Joint swelling   [] Joint pain   [] Low back pain Hematologic:  [] Easy bruising  [] Easy bleeding   [] Hypercoagulable state   [] Anemic Gastrointestinal:  [] Diarrhea   [] Vomiting  [] Gastroesophageal reflux/heartburn   [] Difficulty swallowing. Genitourinary:  [] Chronic kidney disease   [] Difficult urination  [] Frequent urination   [] Blood in urine Skin:  [] Rashes   [] Ulcers  Psychological:  [] History of anxiety   []  History of major depression.  Physical Examination  Vitals:   03/06/17 1335  BP: (!) 148/96  Pulse: 69  Resp: 16  Weight: 198 lb (89.8 kg)  Height: 6' (1.829 m)   Body mass index is 26.85 kg/m. Gen: WD/WN, NAD Head: /AT, No temporalis wasting.  Ear/Nose/Throat: Hearing grossly intact, nares w/o erythema or drainage Eyes: PER, EOMI, sclera nonicteric.  Neck: Supple, no large masses.   Pulmonary:  Good air movement, no audible wheezing bilaterally, no use of accessory muscles.  Cardiac: RRR, no JVD Vascular: left foot is pink and warm with 2 sec cap refill.  No new ulcers. Vessel Right Left  Radial Palpable Palpable  Ulnar Palpable Palpable  Brachial Palpable Palpable  Carotid Palpable Palpable  Femoral Palpable Palpable  Popliteal Palpable Not  Palpable  PT Palpable Not Palpable  DP Palpable Not Palpable  Gastrointestinal: Non-distended. No guarding/no peritoneal signs.  Musculoskeletal: M/S 5/5 throughout.  No deformity or atrophy.  Neurologic: CN 2-12 intact. Symmetrical.  Speech is fluent. Motor exam as listed above. Psychiatric: Judgment intact, Mood & affect appropriate for pt's clinical situation. Dermatologic: No rashes or ulcers noted.  No changes consistent with cellulitis. Lymph : No lichenification or skin changes of chronic lymphedema.  CBC Lab Results  Component Value Date   WBC 12.2 (H) 09/21/2015   HGB 11.9 (L) 09/21/2015   HCT 36.2 (L) 09/21/2015   MCV 89.3 09/21/2015   PLT 241 09/21/2015    BMET    Component Value Date/Time   NA 140 09/21/2015 0801   K 4.4 09/21/2015 0801   CL 104 09/21/2015 0801   CO2 30 09/21/2015 0801   GLUCOSE 108 (H) 09/21/2015 0801   BUN 15 08/26/2016 1645   CREATININE 1.04 08/26/2016 1645   CREATININE 1.03 09/18/2015 1601   CALCIUM 8.5 (L) 09/21/2015 0801  GFRNONAA >60 08/26/2016 1645   GFRAA >60 08/26/2016 1645   CrCl cannot be calculated (Patient's most recent lab result is older than the maximum 21 days allowed.).  COAG Lab Results  Component Value Date   INR 0.95 09/18/2015   INR 1.01 12/28/2012    Radiology No results found.  Assessment/Plan 1. Pain of left lower extremity Recommend:  Patient should undergo arterial duplex of the left lower extremity ASAP because there has been a significant deterioration in the patient's lower extremity symptoms.  The patient states they are having increased pain and a marked decrease in the distance that they can walk.  The risks and benefits as well as the alternatives were discussed in detail with the patient.  All questions were answered.  Patient agrees to proceed and understands this could be a prelude to angiography and intervention.  The patient will follow up with me in the office to review the studies.   - VAS  US ABI WITH/WO TBI; Future - VAS US LOWER EXTREMITY ARTERIAL DUPLEX; Future  2. 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 Thromboangiitis obliterans (Buerger's disease) (HCC) He continue to be tobacco free  4. Pure hypercholesterolemia Continue statin as ordered and reviewed, no changes at this time     Levora DredgeGregory Richie Vadala, MD  03/06/2017 1:55 PM

## 2017-03-28 IMAGING — NM NM MISC PROCEDURE
6 series · 36 of 36 positions shown · non-contrast
Comparison: none

[Series 1: wbr_r-proj_st wbr rest · 6.40mm/px · 6 of 64 frames shown]
[frame 6/64]
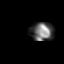
[frame 16/64]
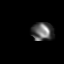
[frame 27/64]
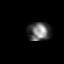
[frame 38/64]
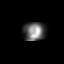
[frame 48/64]
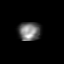
[frame 59/64]
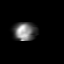

[Series 1: wbr rest · 6.40mm/px · 6 of 64 frames shown]
[frame 6/64]
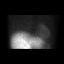
[frame 16/64]
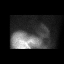
[frame 27/64]
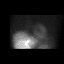
[frame 38/64]
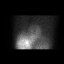
[frame 48/64]
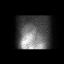
[frame 59/64]
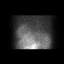

[Series 2: wbr stress-gsp · 6.40mm/px · 6 of 511 frames shown]
[frame 43/511  full-range]
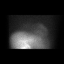
[frame 128/511  full-range]
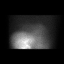
[frame 213/511  full-range]
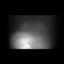
[frame 298/511  full-range]
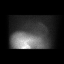
[frame 383/511  full-range]
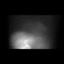
[frame 469/511  full-range]
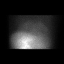

[Series 2: wbr_s-proj_st wbr stress-gsp · 6.40mm/px · 6 of 512 frames shown]
[frame 43/512]
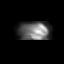
[frame 128/512]
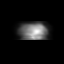
[frame 214/512]
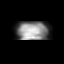
[frame 299/512]
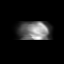
[frame 384/512]
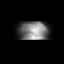
[frame 470/512]
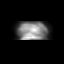

[Series 3: wbr_s-proj_st wbr stress-sum-em · 6.40mm/px · 6 of 64 frames shown]
[frame 6/64]
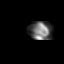
[frame 16/64]
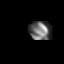
[frame 27/64]
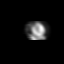
[frame 38/64]
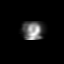
[frame 48/64]
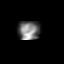
[frame 59/64]
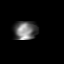

[Series 3: wbr stress-sum-em · 6.40mm/px · 6 of 64 frames shown]
[frame 6/64]
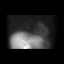
[frame 16/64]
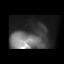
[frame 27/64]
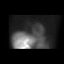
[frame 38/64]
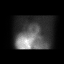
[frame 48/64]
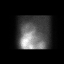
[frame 59/64]
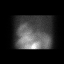

[36 of 36 positions shown; findings below may reference images not displayed]

Canned report from images found in remote index.

Refer to host system for actual result text.

## 2017-04-24 ENCOUNTER — Telehealth (INDEPENDENT_AMBULATORY_CARE_PROVIDER_SITE_OTHER): Payer: Self-pay

## 2017-04-24 NOTE — Telephone Encounter (Signed)
Patient called stating that since he was put on the blood thinner Zontivity he has experienced chest pains so he stopped for a few days and noticed the decrease in the pain so he wants to know if he can go back on the Plavix he was taken before. Per Dr. Gilda CreaseSchnier the patient can resume taking Plavix.

## 2017-05-07 NOTE — Telephone Encounter (Signed)
PATIENT'S PHARMACY CALLED TO CLARIFY THAT PATIENT IS NOT TAKING THE ZONTIVITY BUT HAS RESTARTED TAKING THE PLAVIX 75MG . PER THE MESSAGED FROM THE PATIENT EARLIER AND DR. SCHNIER RESPONSE THE PATIENT IS TAKING THE PLAVIX AND NOT THE ZONTIVITY.

## 2017-07-14 DIAGNOSIS — E559 Vitamin D deficiency, unspecified: Secondary | ICD-10-CM | POA: Insufficient documentation

## 2017-07-15 ENCOUNTER — Other Ambulatory Visit (INDEPENDENT_AMBULATORY_CARE_PROVIDER_SITE_OTHER): Payer: Self-pay | Admitting: Vascular Surgery

## 2017-07-17 ENCOUNTER — Encounter (INDEPENDENT_AMBULATORY_CARE_PROVIDER_SITE_OTHER): Payer: Self-pay | Admitting: Vascular Surgery

## 2017-07-17 ENCOUNTER — Ambulatory Visit (INDEPENDENT_AMBULATORY_CARE_PROVIDER_SITE_OTHER): Payer: Medicaid Other

## 2017-07-17 ENCOUNTER — Ambulatory Visit (INDEPENDENT_AMBULATORY_CARE_PROVIDER_SITE_OTHER): Payer: Medicaid Other | Admitting: Vascular Surgery

## 2017-07-17 VITALS — BP 131/85 | HR 59 | Resp 17 | Ht 72.0 in | Wt 190.0 lb

## 2017-07-17 DIAGNOSIS — I70222 Atherosclerosis of native arteries of extremities with rest pain, left leg: Secondary | ICD-10-CM | POA: Diagnosis not present

## 2017-07-17 DIAGNOSIS — E7800 Pure hypercholesterolemia, unspecified: Secondary | ICD-10-CM | POA: Diagnosis not present

## 2017-07-17 DIAGNOSIS — I70312 Atherosclerosis of unspecified type of bypass graft(s) of the extremities with intermittent claudication, left leg: Secondary | ICD-10-CM | POA: Diagnosis not present

## 2017-07-17 DIAGNOSIS — M79605 Pain in left leg: Secondary | ICD-10-CM

## 2017-07-18 ENCOUNTER — Other Ambulatory Visit (INDEPENDENT_AMBULATORY_CARE_PROVIDER_SITE_OTHER): Payer: Self-pay | Admitting: Vascular Surgery

## 2017-07-19 NOTE — Progress Notes (Signed)
MRN : [id number]  Harold [name] is a 36 y.o. ([birth year]) male who presents with chief complaint of  Chief Complaint  Patient presents with  . Follow-up    Ultrasound follow up  .  History of Present Illness: The patient returns to the office for followup and review of the noninvasive studies. There has been a significant deterioration in the lower extremity symptoms.  The patient notes interval shortening of their claudication distance and development of mild rest pain symptoms. No new ulcers or wounds have occurred since the last visit.  There have been no significant changes to the patient's overall health care.  The patient denies amaurosis fugax or recent TIA symptoms. There are no recent neurological changes noted. The patient denies history of DVT, PE or superficial thrombophlebitis. The patient denies recent episodes of angina or shortness of breath.   ABI's Rt=1.13 and Lt=0.69 (previous ABI's Rt=1.20 and Lt=NA) Duplex US of the lower extremity arterial system shows tibial vessels are occluded or monofphasic  Current Meds  Medication Sig  . atorvastatin (LIPITOR) 40 MG tablet Take 40 mg by mouth daily.   . clopidogrel (PLAVIX) 75 MG tablet Take 1 tablet (75 mg total) by mouth daily.  Marland Kitchen escitalopram (LEXAPRO) 20 MG tablet Take 20 mg by mouth daily.   . Oxycodone HCl 20 MG TABS Take 20 mg by mouth 2 (two) times daily as needed (pain).   . Vitamin D, Ergocalciferol, (DRISDOL) 50000 units CAPS capsule Take 50,000 Units by mouth every Wednesday.   . [DISCONTINUED] acetaminophen (TYLENOL) 500 MG tablet Take 1,000 mg by mouth daily as needed for moderate pain or headache.  . [DISCONTINUED] aspirin EC 81 MG EC tablet Take 1 tablet (81 mg total) by mouth daily.  . [DISCONTINUED] HYDROcodone-acetaminophen (NORCO) 5-325 MG tablet Take 1-2 tablets by mouth every 6 (six) hours as needed for moderate pain.    Past Medical History:  Diagnosis Date  . Anxiety   . Arthritis   . Back  pain   . Shingles     Past Surgical History:  Procedure Laterality Date  . BYPASS GRAFT POPLITEAL TO POPLITEAL Left 09/20/2015   Procedure: BYPASS GRAFT POPLITEAL TO PERONEAL;  Surgeon: Renford Dills, MD;  Location: [facility];  Service: Vascular;  Laterality: Left;  . EMBOLECTOMY Left 09/20/2015   Procedure: EMBOLECTOMY;  Surgeon: Renford Dills, MD;  Location: [facility];  Service: Vascular;  Laterality: Left;  . LUMBAR LAMINECTOMY/DECOMPRESSION MICRODISCECTOMY Right 12/29/2012   Procedure: SPINE: LAMINOTOMY DECOMPRESSION NERVE ROOT EXCISION HERNIATED DISK 1 SPACE LUMBAR;  Surgeon: Mat Carne, MD;  Location: [facility] OR;  Service: Orthopedics;  Laterality: Right;  RIGHT L5-S1 DISC EXCISION  . ORIF ANKLE FRACTURE Left 09/20/2015   Procedure: OPEN REDUCTION INTERNAL FIXATION (ORIF) ANKLE FRACTURE;  Surgeon: Kennedy Bucker, MD;  Location: [facility];  Service: Orthopedics;  Laterality: Left;  . PERIPHERAL VASCULAR CATHETERIZATION Left 03/21/2015   Procedure: Lower Extremity Angiography;  Surgeon: Renford Dills, MD;  Location: [facility];  Service: Cardiovascular;  Laterality: Left;  . PERIPHERAL VASCULAR CATHETERIZATION  03/21/2015   Procedure: Lower Extremity Intervention;  Surgeon: Renford Dills, MD;  Location: [facility];  Service: Cardiovascular;;  . PERIPHERAL VASCULAR CATHETERIZATION Left 09/05/2015   Procedure: Lower Extremity Angiography;  Surgeon: Renford Dills, MD;  Location: [facility];  Service: Cardiovascular;  Laterality: Left;  . PERIPHERAL VASCULAR CATHETERIZATION  09/05/2015   Procedure: Lower Extremity Intervention;  Surgeon: Renford Dills,  MD;  Location: [facility];  Service: Cardiovascular;;  . PERIPHERAL VASCULAR CATHETERIZATION Left 08/27/2016   Procedure: Lower Extremity Angiography;  Surgeon: Renford Dills, MD;  Location: [facility];  Service: Cardiovascular;  Laterality: Left;  . TOOTH EXTRACTION      . WISDOM TOOTH EXTRACTION      Social History Social History  Substance Use Topics  . Smoking status: Former Smoker    Packs/day: 0.50    Years: 16.00    Types: Cigarettes  . Smokeless tobacco: Never Used  . Alcohol use No     Comment: rare    Family History No family history on file.  No Known Allergies   REVIEW OF SYSTEMS (Negative unless checked)  Constitutional: Weight loss  Fever  Chills Cardiac: Chest pain   Chest pressure   Palpitations   Shortness of breath when laying flat   Shortness of breath with exertion. Vascular:  Pain in legs with walking   Pain in legs at rest  History of DVT   Phlebitis   Swelling in legs   Varicose veins   Non-healing ulcers Pulmonary:   Uses home oxygen   Productive cough   Hemoptysis   Wheeze  COPD   Asthma Neurologic:  Dizziness   Seizures   History of stroke   History of TIA  Aphasia   Vissual changes   Weakness or numbness in arm   Weakness or numbness in leg Musculoskeletal:   Joint swelling   Joint pain   Low back pain Hematologic:  Easy bruising  Easy bleeding   Hypercoagulable state   Anemic Gastrointestinal:  Diarrhea   Vomiting  Gastroesophageal reflux/heartburn   Difficulty swallowing. Genitourinary:  Chronic kidney disease   Difficult urination  Frequent urination   Blood in urine Skin:  Rashes   Ulcers  Psychological:  History of anxiety    History of major depression.  Physical Examination  Vitals:   07/17/17 1600  BP: 131/85  Pulse: (!) 59  Resp: 17  Weight: 86.2 kg (190 lb)  Height: 6' (1.829 m)   Body mass index is 25.77 kg/m. Gen: WD/WN, NAD Head: Ward/AT, No temporalis wasting.  Ear/Nose/Throat: Hearing grossly intact, nares w/o erythema or drainage Eyes: PER, EOMI, sclera nonicteric.  Neck: Supple, no large masses.   Pulmonary:  Good air movement, no audible wheezing bilaterally, no use of accessory muscles.   Cardiac: RRR, no JVD Vascular:  Left leg cool to touch Vessel Right Left  Radial Palpable Palpable  Ulnar Palpable Palpable  Brachial Palpable Palpable  Carotid Palpable Palpable  Femoral Palpable Palpable  Popliteal Palpable Not Palpable  PT Palpable Not Palpable  DP Palpable Not Palpable  Gastrointestinal: Non-distended. No guarding/no peritoneal signs.  Musculoskeletal: M/S 5/5 throughout.  No deformity or atrophy.  Neurologic: CN 2-12 intact. Symmetrical.  Speech is fluent. Motor exam as listed above. Psychiatric: Judgment intact, Mood & affect appropriate for pt's clinical situation. Dermatologic: No rashes or ulcers noted.  No changes consistent with cellulitis. Lymph : No lichenification or skin changes of chronic lymphedema.  CBC Lab Results  Component Value Date   WBC 12.2 (H) 09/21/2015   HGB 11.9 (L) 09/21/2015   HCT 36.2 (L) 09/21/2015   MCV 89.3 09/21/2015   PLT 241 09/21/2015    BMET    Component Value Date/Time   NA 140 09/21/2015 0801   K 4.4 09/21/2015 0801   CL 104 09/21/2015 0801   CO2 30 09/21/2015 0801   GLUCOSE  108 (H) 09/21/2015 0801   BUN 15 08/26/2016 1645   CREATININE 1.04 08/26/2016 1645   CREATININE 1.03 09/18/2015 1601   CALCIUM 8.5 (L) 09/21/2015 0801   GFRNONAA >60 08/26/2016 1645   GFRAA >60 08/26/2016 1645   CrCl cannot be calculated (Patient's most recent lab result is older than the maximum 21 days allowed.).  COAG Lab Results  Component Value Date   INR 0.95 09/18/2015   INR 1.01 12/28/2012    Radiology No results found.  Assessment/Plan 1. Atherosclerosis of native artery of left lower extremity with rest pain (HCC) Recommend:  The patient has evidence of severe atherosclerotic changes of both lower extremities with rest pain that is associated with preulcerative changes and impending tissue loss of the foot.  This represents a limb threatening ischemia and places the patient at the risk for limb loss.  Patient should  undergo angiography of the lower extremities with the hope for intervention for limb salvage.  The risks and benefits as well as the alternative therapies was discussed in detail with the patient.  All questions were answered.  Patient agrees to proceed with angiography.  The patient will follow up with me in the office after the procedure.     2. Pain of left lower extremity See #1  3. Pure hypercholesterolemia Continue statin as ordered and reviewed, no changes at this time    Levora Dredge, MD  07/19/2017 3:06 PM

## 2017-07-21 ENCOUNTER — Encounter
Admission: RE | Admit: 2017-07-21 | Discharge: 2017-07-21 | Disposition: A | Discharge status: Home / Self Care | Payer: Medicaid Other | Source: Ambulatory Visit | Attending: Vascular Surgery | Admitting: Vascular Surgery

## 2017-07-21 HISTORY — DX: Peripheral vascular disease, unspecified: I73.9

## 2017-07-21 LAB — BUN: BUN: 19 mg/dL (ref 6–20)

## 2017-07-21 LAB — CREATININE, SERUM
CREATININE: 0.99 mg/dL (ref 0.61–1.24)
GFR calc non Af Amer: >60 mL/min (ref >60)

## 2017-07-21 MED ORDER — CEFAZOLIN SODIUM-DEXTROSE 2-4 GM/100ML-% IV SOLN
2.0000 g | Freq: Once | INTRAVENOUS | Status: AC
  Administered 2017-07-22: 2 g via INTRAVENOUS

## 2017-07-22 ENCOUNTER — Inpatient Hospital Stay
Admission: RE | Admit: 2017-07-22 | Discharge: 2017-07-23 | DRG: 272 | Disposition: A | Discharge status: Home / Self Care | Payer: Medicaid Other | Source: Ambulatory Visit | Attending: Vascular Surgery | Admitting: Vascular Surgery

## 2017-07-22 ENCOUNTER — Encounter
Admission: RE | Disposition: A | Discharge status: Home / Self Care | Payer: Self-pay | Source: Ambulatory Visit | Attending: Vascular Surgery

## 2017-07-22 DIAGNOSIS — I70222 Atherosclerosis of native arteries of extremities with rest pain, left leg: Principal | ICD-10-CM | POA: Diagnosis present

## 2017-07-22 DIAGNOSIS — M1990 Unspecified osteoarthritis, unspecified site: Secondary | ICD-10-CM | POA: Diagnosis present

## 2017-07-22 DIAGNOSIS — I70201 Unspecified atherosclerosis of native arteries of extremities, right leg: Secondary | ICD-10-CM | POA: Diagnosis present

## 2017-07-22 DIAGNOSIS — I70229 Atherosclerosis of native arteries of extremities with rest pain, unspecified extremity: Secondary | ICD-10-CM | POA: Diagnosis present

## 2017-07-22 DIAGNOSIS — F419 Anxiety disorder, unspecified: Secondary | ICD-10-CM | POA: Diagnosis present

## 2017-07-22 HISTORY — PX: LOWER EXTREMITY INTERVENTION: CATH118252

## 2017-07-22 HISTORY — PX: LOWER EXTREMITY ANGIOGRAPHY: CATH118251

## 2017-07-22 SURGERY — LOWER EXTREMITY ANGIOGRAPHY
Anesthesia: Moderate Sedation | Laterality: Left

## 2017-07-22 MED ORDER — MIDAZOLAM HCL 5 MG/5ML IJ SOLN
INTRAMUSCULAR | Status: AC
  Filled 2017-07-22: qty 5

## 2017-07-22 MED ORDER — FENTANYL CITRATE (PF) 100 MCG/2ML IJ SOLN
INTRAMUSCULAR | Status: AC
  Filled 2017-07-22: qty 2

## 2017-07-22 MED ORDER — TIROFIBAN HCL IN NACL 5-0.9 MG/100ML-% IV SOLN
0.1500 ug/kg/min | INTRAVENOUS | Status: AC
  Administered 2017-07-22 – 2017-07-23 (×2): 0.15 ug/kg/min via INTRAVENOUS
  Filled 2017-07-22 (×3): qty 100

## 2017-07-22 MED ORDER — METHYLPREDNISOLONE SODIUM SUCC 125 MG IJ SOLR: 125.0000 mg | INTRAMUSCULAR | Status: DC | PRN

## 2017-07-22 MED ORDER — LIDOCAINE HCL (PF) 1 % IJ SOLN
INTRAMUSCULAR | Status: AC
  Filled 2017-07-22: qty 30

## 2017-07-22 MED ORDER — SODIUM CHLORIDE 0.9% FLUSH: 3.0000 mL | INTRAVENOUS | Status: DC | PRN

## 2017-07-22 MED ORDER — HYDRALAZINE HCL 20 MG/ML IJ SOLN: 5.0000 mg | INTRAMUSCULAR | Status: DC | PRN

## 2017-07-22 MED ORDER — SODIUM CHLORIDE 0.9 % IV SOLN: 250.0000 mL | INTRAVENOUS | Status: DC | PRN

## 2017-07-22 MED ORDER — MIDAZOLAM HCL 2 MG/2ML IJ SOLN
INTRAMUSCULAR | Status: AC
  Filled 2017-07-22: qty 2

## 2017-07-22 MED ORDER — LABETALOL HCL 5 MG/ML IV SOLN: 10.0000 mg | INTRAVENOUS | Status: DC | PRN

## 2017-07-22 MED ORDER — ONDANSETRON HCL 4 MG/2ML IJ SOLN: 4.0000 mg | Freq: Four times a day (QID) | INTRAMUSCULAR | Status: DC | PRN

## 2017-07-22 MED ORDER — OXYCODONE HCL 5 MG PO TABS
5.0000 mg | ORAL_TABLET | ORAL | Status: DC | PRN
  Administered 2017-07-22: 5 mg via ORAL
  Filled 2017-07-22: qty 2

## 2017-07-22 MED ORDER — CEFAZOLIN SODIUM-DEXTROSE 2-4 GM/100ML-% IV SOLN
INTRAVENOUS | Status: AC
  Filled 2017-07-22: qty 100

## 2017-07-22 MED ORDER — HEPARIN SODIUM (PORCINE) 1000 UNIT/ML IJ SOLN
INTRAMUSCULAR | Status: DC | PRN
  Administered 2017-07-22: 5000 [IU] via INTRAVENOUS

## 2017-07-22 MED ORDER — TIROFIBAN HCL IV 12.5 MG/250 ML
INTRAVENOUS | Status: AC
  Administered 2017-07-22: 12500 ug
  Filled 2017-07-22: qty 250

## 2017-07-22 MED ORDER — ESCITALOPRAM OXALATE 20 MG PO TABS
20.0000 mg | ORAL_TABLET | Freq: Every day | ORAL | Status: DC
  Administered 2017-07-22 – 2017-07-23 (×2): 20 mg via ORAL
  Filled 2017-07-22 (×2): qty 1

## 2017-07-22 MED ORDER — TIROFIBAN (AGGRASTAT) BOLUS VIA INFUSION
25.0000 ug/kg | Freq: Once | INTRAVENOUS | Status: DC
  Filled 2017-07-22: qty 44

## 2017-07-22 MED ORDER — SODIUM CHLORIDE 0.9 % IV SOLN
INTRAVENOUS | Status: DC
  Administered 2017-07-22: 09:00:00 via INTRAVENOUS

## 2017-07-22 MED ORDER — DIPHENHYDRAMINE HCL 50 MG/ML IJ SOLN
INTRAMUSCULAR | Status: DC | PRN
  Administered 2017-07-22: 50 mg via INTRAVENOUS

## 2017-07-22 MED ORDER — MIDAZOLAM HCL 2 MG/2ML IJ SOLN
INTRAMUSCULAR | Status: DC | PRN
  Administered 2017-07-22 (×4): 1 mg via INTRAVENOUS
  Administered 2017-07-22: 2 mg via INTRAVENOUS
  Administered 2017-07-22: 1 mg via INTRAVENOUS

## 2017-07-22 MED ORDER — FAMOTIDINE 20 MG PO TABS: 40.0000 mg | ORAL_TABLET | ORAL | Status: DC | PRN

## 2017-07-22 MED ORDER — HEPARIN (PORCINE) IN NACL 100-0.45 UNIT/ML-% IJ SOLN: 800.0000 [IU]/h | INTRAMUSCULAR | Status: DC

## 2017-07-22 MED ORDER — ATORVASTATIN CALCIUM 20 MG PO TABS
40.0000 mg | ORAL_TABLET | Freq: Every day | ORAL | Status: DC
  Administered 2017-07-22 – 2017-07-23 (×2): 40 mg via ORAL
  Filled 2017-07-22 (×2): qty 2

## 2017-07-22 MED ORDER — ALPRAZOLAM 0.5 MG PO TABS: 0.2500 mg | ORAL_TABLET | Freq: Two times a day (BID) | ORAL | Status: DC | PRN

## 2017-07-22 MED ORDER — IOPAMIDOL (ISOVUE-300) INJECTION 61%
INTRAVENOUS | Status: DC | PRN
  Administered 2017-07-22: 85 mL via INTRAVENOUS

## 2017-07-22 MED ORDER — FENTANYL CITRATE (PF) 100 MCG/2ML IJ SOLN
INTRAMUSCULAR | Status: DC | PRN
  Administered 2017-07-22: 25 ug via INTRAVENOUS
  Administered 2017-07-22: 50 ug via INTRAVENOUS
  Administered 2017-07-22 (×2): 25 ug via INTRAVENOUS
  Administered 2017-07-22: 50 ug via INTRAVENOUS
  Administered 2017-07-22: 25 ug via INTRAVENOUS
  Administered 2017-07-22: 100 ug via INTRAVENOUS

## 2017-07-22 MED ORDER — SODIUM CHLORIDE 0.9 % IV SOLN
INTRAVENOUS | Status: AC
  Administered 2017-07-22 (×2): via INTRAVENOUS

## 2017-07-22 MED ORDER — DIPHENHYDRAMINE HCL 50 MG/ML IJ SOLN
INTRAMUSCULAR | Status: AC
  Filled 2017-07-22: qty 1

## 2017-07-22 MED ORDER — HYDROMORPHONE HCL 1 MG/ML IJ SOLN
INTRAMUSCULAR | Status: AC
  Filled 2017-07-22: qty 0.5

## 2017-07-22 MED ORDER — ACETAMINOPHEN 325 MG PO TABS: 650.0000 mg | ORAL_TABLET | ORAL | Status: DC | PRN

## 2017-07-22 MED ORDER — MORPHINE SULFATE (PF) 4 MG/ML IV SOLN: 2.0000 mg | INTRAVENOUS | Status: DC | PRN

## 2017-07-22 MED ORDER — HYDROMORPHONE HCL 1 MG/ML IJ SOLN: 1.0000 mg | Freq: Once | INTRAMUSCULAR | Status: DC | PRN

## 2017-07-22 MED ORDER — HYDROMORPHONE HCL 1 MG/ML IJ SOLN
0.5000 mg | Freq: Once | INTRAMUSCULAR | Status: AC
  Administered 2017-07-22: 0.5 mg via INTRAVENOUS

## 2017-07-22 MED ORDER — HEPARIN (PORCINE) IN NACL 2-0.9 UNIT/ML-% IJ SOLN
INTRAMUSCULAR | Status: AC
  Filled 2017-07-22: qty 1000

## 2017-07-22 MED ORDER — HEPARIN SODIUM (PORCINE) 1000 UNIT/ML IJ SOLN
INTRAMUSCULAR | Status: AC
  Filled 2017-07-22: qty 1

## 2017-07-22 MED ORDER — SODIUM CHLORIDE 0.9% FLUSH
3.0000 mL | Freq: Two times a day (BID) | INTRAVENOUS | Status: DC
  Administered 2017-07-22 – 2017-07-23 (×2): 3 mL via INTRAVENOUS

## 2017-07-22 SURGICAL SUPPLY — 33 items
BALLN LUTONIX DCB 4X40X130 (BALLOONS) ×4
BALLN ULTRVRSE 2.5X150X150 (BALLOONS) ×4
BALLN ULTRVRSE 2X20X150 (BALLOONS) ×4
BALLN ULTRVRSE 2X300X150 (BALLOONS) ×4
BALLN ULTRVRSE 3X80X150 (BALLOONS)
BALLN ULTRVRSE 3X80X150 OTW (BALLOONS)
BALLOON LUTONIX DCB 4X40X130 (BALLOONS) ×2 IMPLANT
BALLOON ULTRVRSE 2.5X150X150 (BALLOONS) ×2 IMPLANT
BALLOON ULTRVRSE 2X20X150 (BALLOONS) ×2 IMPLANT
BALLOON ULTRVRSE 2X300X150 (BALLOONS) ×2 IMPLANT
BALLOON ULTRVRSE 3X80X150 OTW (BALLOONS) IMPLANT
CANNULA 5F STIFF (CANNULA) ×4 IMPLANT
CATH CROSSER S6 154CM (CATHETERS) ×4 IMPLANT
CATH CXI SUPP ST 2.6FR 150CM (CATHETERS) ×4 IMPLANT
CATH GWIRE MARINER STRGHT 4FR (CATHETERS) ×8 IMPLANT
CATH PIG 70CM (CATHETERS) ×4 IMPLANT
CATH USHER ANGLED 130CM (CATHETERS) ×4 IMPLANT
DEVICE PRESTO INFLATION (MISCELLANEOUS) ×4 IMPLANT
DEVICE STARCLOSE SE CLOSURE (Vascular Products) ×4 IMPLANT
DEVICE TORQUE (MISCELLANEOUS) ×4 IMPLANT
GLIDEWIRE ADV .014X300CM (WIRE) ×4 IMPLANT
GUIDEWIRE LT ZIPWIRE 035X260 (WIRE) ×4 IMPLANT
KIT FLOWMATE PROCEDURAL (MISCELLANEOUS) ×4 IMPLANT
NEEDLE ENTRY 21GA 7CM ECHOTIP (NEEDLE) ×4 IMPLANT
PACK ANGIOGRAPHY (CUSTOM PROCEDURE TRAY) ×4 IMPLANT
SET INTRO CAPELLA COAXIAL (SET/KITS/TRAYS/PACK) ×4 IMPLANT
SHEATH BRITE TIP 5FRX11 (SHEATH) ×4 IMPLANT
SHEATH RAABE 6FR (SHEATH) ×4 IMPLANT
SHIELD RADPAD SCOOP 12X17 (MISCELLANEOUS) ×4 IMPLANT
TUBING CONTRAST HIGH PRESS 72 (TUBING) ×4 IMPLANT
VALVE COPILOT STAT (MISCELLANEOUS) ×4 IMPLANT
WIRE G V18X300CM (WIRE) ×4 IMPLANT
WIRE J 3MM .035X145CM (WIRE) ×4 IMPLANT

## 2017-07-22 NOTE — H&P (Signed)
[facility] History & Physical Update  The patient was interviewed and re-examined.  The patient's previous History and Physical has been reviewed and is unchanged.  There is no change in the plan of care. We plan to proceed with the scheduled procedure.  Levora Dredge, MD  07/22/2017, 10:23 AM

## 2017-07-22 NOTE — Progress Notes (Signed)
Dr. [name] in at bedside discussing test results with pt. Wife. Aggrastat bolus and gtt. Started per orders.

## 2017-07-22 NOTE — Progress Notes (Signed)
Patient here today for lower extremity angiogram per Dr Schnier. Attached to monitor and will follow vitals throughout entire procedure. 

## 2017-07-22 NOTE — Op Note (Signed)
Harold [name] Percutaneous Study/Intervention Procedural Note   Date of Surgery: 07/22/2017  Surgeon:  Katha Cabal, MD.  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 Introduction catheter into left lower extremity 3rd order catheter placement  2. Contrast injection left lower extremity for distal runoff   3. Crosser atherectomy of the left anterior tibial artery 4. Percutaneous transluminal angioplasty left anterior tibial artery with a 2.5 mm Ultraverse balloon             5.  Percutaneous transluminal angioplasty of the popliteal arteries with a 4 mm Lutonix drug eluding balloon                      6.   Star close closure right common femoral arteriotomy                Anesthesia: Conscious sedation was administered under my direct supervision by the interventional radiology RN. IV Versed plus fentanyl were utilized. Continuous ECG, pulse oximetry and blood pressure was monitored throughout the entire procedure. Conscious sedation was for a total of 85 minutes.  Sheath: 6 French Rabi right common femoral  Contrast: 100 cc  Fluoroscopy Time: Approximately 15 minutes  Indications: Harold [name] presents with atherosclerotic occlusive disease bilateral lower extremities. The patient has developed rest pain of the left lower extremity. This places the patient at high risk for limb loss and amputation. The risks and benefits are reviewed all questions answered patient agrees to proceed.  Procedure: Harold [name] is a 36 y.o. y.o. male who was identified and appropriate procedural time out was performed. The patient was then placed supine on the table and prepped and draped in the usual sterile fashion.   Ultrasound was placed in the sterile sleeve and the  right groin was evaluated the right common femoral artery was echolucent and pulsatile indicating patency.  Image was recorded for the permanent record and under real-time visualization a microneedle was inserted into the common femoral artery microwire followed by a micro-sheath.  A J-wire was then advanced through the micro-sheath and a  5 [place] sheath was then inserted over a J-wire. J-wire was then advanced and a 5 French pigtail catheter was positioned at the level of T12. AP projection of the aorta was then obtained. Pigtail catheter was repositioned to above the bifurcation and a RAO view of the pelvis was obtained.  Subsequently a pigtail catheter with the stiff angle Glidewire was used to cross the aortic bifurcation the catheter wire were advanced down into the left distal external iliac artery. Oblique view of the femoral bifurcation was then obtained and subsequently the wire was reintroduced and the pigtail catheter negotiated into the SFA representing third order catheter placement. Distal runoff was then performed.  5000 units of heparin was then given and allowed to circulate and a 6 [place] rabies sheath was advanced up and over the bifurcation and positioned in the common femoral artery.  Wire and catheter were then used to negotiate the SFA stenosis and the catheter was then positioned in the distal popliteal where magnified imaging of the trifurcation was performed.  The S6 Crosser catheter was then prepped on the field and a angled Usher catheter was advanced into the cul-de-sac of the anterior tibial artery under magnified imaging. Using the Crosser catheter the occlusion of the anterior tibial artery was negotiated.  Injection of contrast confirmed intraluminal positioning.  VAT wire was then advanced through the Usher catheter and positioned with the tip down onto the foot.  A 2 mm x 30 cm Ultraverse balloon was then used to angioplasty the anterior tibial artery.  Subsequently, a 2.5  mm x 20 cm Ultraverse balloon was used to angioplasty the anterior tibial artery. Inflations were to 10-12 atm for 1 full minute. Follow-up imaging demonstrated patency of the anterior tibial artery and the popliteal were now addressed  A 4 mm x 4 cm Lutonix drug-eluting balloon was used to angioplasty the distal popliteal artery. Inflations were to 8 atmospheres for 2 minutes. Follow-up imaging demonstrated patency with adequate preparation of the vessel for a drug-coated balloon.   Distal runoff was then reassessed and noted to be patent.  After review of these images the sheath is pulled into the right external iliac oblique of the common femoral is obtained and a Star close device deployed. There no immediate Complications.  Findings: The abdominal aorta is opacified with a bolus injection contrast. Renal arteries are single and widely patent. The aorta itself has diffuse disease but no hemodynamically significant lesions. The common and external iliac arteries are widely patent bilaterally.  The left common femoral is widely patent as is the profunda femoris.  The SFA does not  have a significant stenosis.  The distal popliteal demonstrates increasing disease especially below the level of the previously placed stent extending down to the trifurcation. The stent itself is widely patent. and the trifurcation is heavily diseased with occlusion of the tibioperoneal trunk posterior tibial and peroneal arteries area the peroneal artery does reconstitute at the level of the ankle and contribute significantly to the foot via a short segment of the lateral plantar. This peroneal arteries reconstitute from a very large 2 mm collateral extending off the proximal anterior tibial. The anterior tibial is widely patent proximally but then in its midportion occludes. Distally on the foot there is very poor filling of a pedal arch or digital vessels..   Following atherectomy and angioplasty the anterior tibial  now  is patent in-line flow and looks nice with less than 5% residual stenosis.  However, there remains extremely disadvantaged outflow in the foot. For this reason Aggrastat will be run overnight. Angioplasty of the popliteal with a Lutonix balloon shows an excellent result with less than 15% residual stenosis and now patent in-line flow to the anterior tibial and the above-noted large collateral.  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  Belenda Cruise Doryan Bahl 07/22/2017,9:12 PM

## 2017-07-22 NOTE — Plan of Care (Signed)
Problem: Safety: Goal: Ability to remain free from injury will improve Outcome: Progressing Patient's VSS throughout the shift. Patient complaints of pain, PRN Oxycodone given. Aggrastat drip at 15.7 Patient resting well. RN will continue to monitor.

## 2017-07-23 ENCOUNTER — Encounter: Payer: Self-pay | Admitting: Vascular Surgery

## 2017-07-23 DIAGNOSIS — I70222 Atherosclerosis of native arteries of extremities with rest pain, left leg: Secondary | ICD-10-CM

## 2017-07-23 MED ORDER — ASPIRIN EC 81 MG PO TBEC: 81.0000 mg | DELAYED_RELEASE_TABLET | Freq: Every day | ORAL | 2 refills | Status: DC

## 2017-07-23 MED ORDER — CLOPIDOGREL BISULFATE 75 MG PO TABS
300.0000 mg | ORAL_TABLET | Freq: Once | ORAL | Status: AC
Start: 2017-07-23 — End: 2017-07-23
  Administered 2017-07-23: 300 mg via ORAL
  Filled 2017-07-23: qty 4

## 2017-07-23 NOTE — Discharge Summary (Signed)
[facility]    Discharge Summary    Patient ID:  ELIM ECONOMOU MRN: [id number] DOB/AGE: 1981-05-04 36 y.o.  Admit date: 07/22/2017 Discharge date: 07/23/2017 Date of Surgery: 07/22/2017 Surgeon: Surgeon(s): Schnier, [name], MD  Admission Diagnosis: LT leg angio w possible intervention      ASO w claudication  Discharge Diagnoses:  LT leg angio w possible intervention      ASO w claudication  Secondary Diagnoses: Past Medical History:  Diagnosis Date  . Anxiety   . Arthritis   . Back pain   . Peripheral vascular disease (HCC)   . Shingles     Procedure(s): Lower Extremity Angiography LOWER EXTREMITY INTERVENTION  Discharged Condition: good  HPI:  Patient presented on the day of admission for revascularization for limb salvage. Angiography performed on October 2 was successful with recanalization of the left anterior tibial.  Postoperatively because of the extent of his distal small vessel disease he was initiated on Aggrastat drip. This was maintained overnight with an excellent result. On evaluation today he has a hyperemic hot foot with trong Doppler signals. He is felt fit for discharge  Hospital Course:  ROHAAN DURNIL is a 36 y.o. male is S/P Left Procedure(s): Lower Extremity Angiography LOWER EXTREMITY INTERVENTION Extubated: POD # 0 Physical exam: left foot good Doppler signalwarm to touch with brisk capillary refill Post-op wounds clean, dry, intact or healing well Pt. Ambulating, voiding and taking PO diet without difficulty. Pt pain controlled with PO pain meds. Labs as below Complications:none  Consults:    Significant Diagnostic Studies: CBC Lab Results  Component Value Date   WBC 12.2 (H) 09/21/2015   HGB 11.9 (L) 09/21/2015   HCT 36.2 (L) 09/21/2015   MCV 89.3 09/21/2015   PLT 241 09/21/2015    BMET    Component Value Date/Time   NA 140 09/21/2015 0801   K 4.4 09/21/2015 0801   CL 104 09/21/2015 0801   CO2 30 09/21/2015 0801   GLUCOSE 108 (H) 09/21/2015 0801   BUN 19 07/21/2017 0809   CREATININE 0.99 07/21/2017 0809   CREATININE 1.03 09/18/2015 1601   CALCIUM 8.5 (L) 09/21/2015 0801   GFRNONAA >60 07/21/2017 0809   GFRAA >60 07/21/2017 0809   COAG Lab Results  Component Value Date   INR 0.95 09/18/2015   INR 1.01 12/28/2012     Disposition:  Discharge to :Home Discharge Instructions    Call MD for:  redness, tenderness, or signs of infection (pain, swelling, bleeding, redness, odor or green/yellow discharge around incision site)    Complete by:  As directed    Call MD for:  severe or increased pain, loss or decreased feeling  in affected limb(s)    Complete by:  As directed    Call MD for:  temperature >100.5    Complete by:  As directed    Discharge instructions    Complete by:  As directed    OK to shower   Driving Restrictions    Complete by:  As directed    No driving for 24 hours   Lifting restrictions    Complete by:  As directed    No lifting for 72 hours   Resume previous diet    Complete by:  As directed      Allergies as of 07/23/2017   No Known Allergies     Medication List    STOP taking these medications   ZONTIVITY 2.08 MG Tabs Generic drug:  Vorapaxar Sulfate     TAKE these medications   aspirin EC 81 MG tablet Take 1 tablet (81 mg total) by mouth daily.   atorvastatin 40 MG tablet Commonly known as:  LIPITOR Take 40 mg by mouth daily.   clopidogrel 75 MG tablet Commonly known as:  PLAVIX Take 1 tablet (75 mg total) by mouth daily.   escitalopram 20 MG tablet Commonly known as:  LEXAPRO Take 20 mg by mouth daily.   Oxycodone HCl 20 MG Tabs Take 20 mg by mouth 2 (two) times daily as needed (pain).   Vitamin D (Ergocalciferol) 50000 units Caps capsule Commonly known as:  DRISDOL Take 50,000 Units by mouth every Wednesday.      Verbal and written Discharge instructions given to the patient. Wound care per Discharge AVS Follow-up  Information    Schnier, [name], MD Follow up in 2 week(s).   Specialties:  Vascular Surgery, Cardiology, Radiology, Vascular Surgery Why:  with ABI Contact information: [address] [place] [address] [phone]           Signed: Levora Dredge, MD  07/23/2017, 2:01 PM

## 2017-08-11 ENCOUNTER — Ambulatory Visit (INDEPENDENT_AMBULATORY_CARE_PROVIDER_SITE_OTHER): Payer: Medicaid Other | Admitting: Vascular Surgery

## 2017-08-11 ENCOUNTER — Other Ambulatory Visit (INDEPENDENT_AMBULATORY_CARE_PROVIDER_SITE_OTHER): Payer: Self-pay | Admitting: Vascular Surgery

## 2017-08-11 ENCOUNTER — Ambulatory Visit (INDEPENDENT_AMBULATORY_CARE_PROVIDER_SITE_OTHER): Payer: Medicaid Other

## 2017-08-11 ENCOUNTER — Encounter (INDEPENDENT_AMBULATORY_CARE_PROVIDER_SITE_OTHER): Payer: Self-pay | Admitting: Vascular Surgery

## 2017-08-11 VITALS — BP 141/73 | HR 68 | Resp 16 | Ht 72.0 in | Wt 193.0 lb

## 2017-08-11 DIAGNOSIS — E7800 Pure hypercholesterolemia, unspecified: Secondary | ICD-10-CM

## 2017-08-11 DIAGNOSIS — I70229 Atherosclerosis of native arteries of extremities with rest pain, unspecified extremity: Secondary | ICD-10-CM

## 2017-08-11 DIAGNOSIS — I739 Peripheral vascular disease, unspecified: Secondary | ICD-10-CM | POA: Diagnosis not present

## 2017-08-11 DIAGNOSIS — M79605 Pain in left leg: Secondary | ICD-10-CM | POA: Diagnosis not present

## 2017-08-11 DIAGNOSIS — I731 Thromboangiitis obliterans [Buerger's disease]: Secondary | ICD-10-CM | POA: Diagnosis not present

## 2017-08-11 MED ORDER — OXYCODONE HCL 5 MG PO TABS: 20.0000 mg | ORAL_TABLET | Freq: Four times a day (QID) | ORAL | 0 refills | Status: DC | PRN

## 2017-08-11 MED ORDER — OXYCODONE HCL 20 MG PO TABS: 20.0000 mg | ORAL_TABLET | Freq: Three times a day (TID) | ORAL | 0 refills | Status: DC | PRN

## 2017-08-12 NOTE — Progress Notes (Signed)
MRN : [id number]  Harold [name] is a 36 y.o. (25-Jun-1981) male who presents with chief complaint of  Chief Complaint  Patient presents with  . Follow-up    2 week f/u  .  History of Present Illness: The patient returns to the office for followup and review of the noninvasive studies. There has been a significant deterioration in the lower extremity symptoms.  The patient notes interval shortening of their claudication distance and development of mild rest pain symptoms. No new ulcers or wounds have occurred since the last visit.  Of note following his last intervention he had a palpable dorsalis pedis pulse at the ankle level.  There have been no significant changes to the patient's overall health care.  The patient denies amaurosis fugax or recent TIA symptoms. There are no recent neurological changes noted. The patient denies history of DVT, PE or superficial thrombophlebitis. The patient denies recent episodes of angina or shortness of breath.   ABI's Rt=1.13 and Lt=0.67    Current Meds  Medication Sig  . aspirin EC 81 MG tablet Take 1 tablet (81 mg total) by mouth daily.  Marland Kitchen atorvastatin (LIPITOR) 40 MG tablet Take 40 mg by mouth daily.   . clopidogrel (PLAVIX) 75 MG tablet Take 1 tablet (75 mg total) by mouth daily.  Marland Kitchen escitalopram (LEXAPRO) 20 MG tablet Take 20 mg by mouth daily.   . Vitamin D, Ergocalciferol, (DRISDOL) 50000 units CAPS capsule Take 50,000 Units by mouth every Wednesday.   . [DISCONTINUED] Oxycodone HCl 20 MG TABS Take 20 mg by mouth 2 (two) times daily as needed (pain).     Past Medical History:  Diagnosis Date  . Anxiety   . Arthritis   . Back pain   . Peripheral vascular disease (HCC)   . Shingles     Past Surgical History:  Procedure Laterality Date  . BYPASS GRAFT POPLITEAL TO POPLITEAL Left 09/20/2015   Procedure: BYPASS GRAFT POPLITEAL TO PERONEAL;  Surgeon: Renford Dills, MD;  Location: [facility] ORS;  Service: Vascular;  Laterality:  Left;  . EMBOLECTOMY Left 09/20/2015   Procedure: EMBOLECTOMY;  Surgeon: Renford Dills, MD;  Location: [facility] OR[facility];  Service: Vascular;  Laterality: Left;  . LOWER EXTREMITY ANGIOGRAPHY Left 07/22/2017   Procedure: Lower Extremity Angiography;  Surgeon: Renford Dills, MD;  Location: [facility];  Service: Cardiovascular;  Laterality: Left;  . LOWER EXTREMITY INTERVENTION  07/22/2017   Procedure: LOWER EXTREMITY INTERVENTION;  Surgeon: Renford Dills, MD;  Location: [facility];  Service: Cardiovascular;;  . LUMBAR LAMINECTOMY/DECOMPRESSION MICRODISCECTOMY Right 12/29/2012   Procedure: SPINE: LAMINOTOMY DECOMPRESSION NERVE ROOT EXCISION HERNIATED DISK 1 SPACE LUMBAR;  Surgeon: Mat Carne, MD;  Location: [facility] OR;  Service: Orthopedics;  Laterality: Right;  RIGHT L5-S1 DISC EXCISION  . ORIF ANKLE FRACTURE Left 09/20/2015   Procedure: OPEN REDUCTION INTERNAL FIXATION (ORIF) ANKLE FRACTURE;  Surgeon: Kennedy Bucker, MD;  Location: [facility] OR[facility];  Service: Orthopedics;  Laterality: Left;  . PERIPHERAL VASCULAR CATHETERIZATION Left 03/21/2015   Procedure: Lower Extremity Angiography;  Surgeon: Renford Dills, MD;  Location: [facility];  Service: Cardiovascular;  Laterality: Left;  . PERIPHERAL VASCULAR CATHETERIZATION  03/21/2015   Procedure: Lower Extremity Intervention;  Surgeon: Renford Dills, MD;  Location: [facility];  Service: Cardiovascular;;  . PERIPHERAL VASCULAR CATHETERIZATION Left 09/05/2015   Procedure: Lower Extremity Angiography;  Surgeon: Renford Dills, MD;  Location: [facility];  Service:  Cardiovascular;  Laterality: Left;  . PERIPHERAL VASCULAR CATHETERIZATION  09/05/2015   Procedure: Lower Extremity Intervention;  Surgeon: Renford Dills, MD;  Location: [facility];  Service: Cardiovascular;;  . PERIPHERAL VASCULAR CATHETERIZATION Left 08/27/2016   Procedure: Lower Extremity Angiography;  Surgeon: Renford Dills, MD;  Location: [facility];  Service: Cardiovascular;  Laterality: Left;  . TOOTH EXTRACTION    . WISDOM TOOTH EXTRACTION      Social History Social History  Substance Use Topics  . Smoking status: Current Some Day Smoker    Packs/day: 0.50    Years: 16.00    Types: Cigarettes  . Smokeless tobacco: Never Used  . Alcohol use No     Comment: rare    Family History No family history on file.  No Known Allergies   REVIEW OF SYSTEMS (Negative unless checked)  Constitutional: [] Weight loss  [] Fever  [] Chills Cardiac: [] Chest pain   [] Chest pressure   [] Palpitations   [] Shortness of breath when laying flat   [] Shortness of breath with exertion. Vascular:  [x] Pain in legs with walking   [x] Pain in legs at rest  [] History of DVT   [] Phlebitis   [] Swelling in legs   [] Varicose veins   [] Non-healing ulcers Pulmonary:   [] Uses home oxygen   [] Productive cough   [] Hemoptysis   [] Wheeze  [] COPD   [] Asthma Neurologic:  [] Dizziness   [] Seizures   [] History of stroke   [] History of TIA  [] Aphasia   [] Vissual changes   [] Weakness or numbness in arm   [] Weakness or numbness in leg Musculoskeletal:   [] Joint swelling   [] Joint pain   [] Low back pain Hematologic:  [] Easy bruising  [] Easy bleeding   [] Hypercoagulable state   [] Anemic Gastrointestinal:  [] Diarrhea   [] Vomiting  [] Gastroesophageal reflux/heartburn   [] Difficulty swallowing. Genitourinary:  [] Chronic kidney disease   [] Difficult urination  [] Frequent urination   [] Blood in urine Skin:  [] Rashes   [] Ulcers  Psychological:  [] History of anxiety   []  History of major depression.  Physical Examination  Vitals:   08/11/17 1643  BP: (!) 141/73  Pulse: 68  Resp: 16  Weight: 87.5 kg (193 lb)  Height: 6' (1.829 m)   Body mass index is 26.18 kg/m. Gen: WD/WN, NAD Head: [place]/AT, No temporalis wasting.  Ear/Nose/Throat: Hearing grossly intact, nares w/o erythema or drainage Eyes: PER, EOMI, sclera nonicteric.  Neck:  Supple, no large masses.   Pulmonary:  Good air movement, no audible wheezing bilaterally, no use of accessory muscles.  Cardiac: RRR, no JVD Vascular: Left forefoot is cold to the touch pale with sluggish capillary refill Vessel Right Left  Radial Palpable Palpable  PT Palpable  not palpable  DP Palpable  not palpable  Gastrointestinal: Non-distended. No guarding/no peritoneal signs.  Musculoskeletal: M/S 5/5 throughout.  No deformity or atrophy.  Neurologic: CN 2-12 intact. Symmetrical.  Speech is fluent. Motor exam as listed above. Psychiatric: Judgment intact, Mood & affect appropriate for pt's clinical situation. Dermatologic: No rashes or ulcers noted.  No changes consistent with cellulitis. Lymph : No lichenification or skin changes of chronic lymphedema.  CBC Lab Results  Component Value Date   WBC 12.2 (H) 09/21/2015   HGB 11.9 (L) 09/21/2015   HCT 36.2 (L) 09/21/2015   MCV 89.3 09/21/2015   PLT 241 09/21/2015    BMET    Component Value Date/Time   NA 140 09/21/2015 0801   K 4.4 09/21/2015 0801   CL 104 09/21/2015  0801   CO2 30 09/21/2015 0801   GLUCOSE 108 (H) 09/21/2015 0801   BUN 19 07/21/2017 0809   CREATININE 0.99 07/21/2017 0809   CREATININE 1.03 09/18/2015 1601   CALCIUM 8.5 (L) 09/21/2015 0801   GFRNONAA >60 07/21/2017 0809   GFRAA >60 07/21/2017 0809   CrCl cannot be calculated (Patient's most recent lab result is older than the maximum 21 days allowed.).  COAG Lab Results  Component Value Date   INR 0.95 09/18/2015   INR 1.01 12/28/2012    Radiology No results found.   Assessment/Plan 1. Atherosclerosis of artery of extremity with rest pain (HCC) Recommend:  The patient has evidence of severe atherosclerotic changes of both lower extremities with rest pain that is associated with preulcerative changes and impending tissue loss of the left foot.  This represents a limb threatening ischemia and places the patient at the risk for limb loss.   His recent intervention is failed, he had a palpable pulse posted but upon return to the office there is no palpable pulse and his ABI is only marginally improved compared to the pre-and she will ABI.  The possibility of re-angiography was discussed in detail.  However, the patient brought up the possibility of below-knee amputation.  This also was discussed in great detail.  I recommended that I contact my and that the patient talk with Mr. Jennette Kettleeal to learn more about amputation and prostheses.  The patient verbally consented to this.  I am hopeful that the more Harold [name] learns about the minimal debility that a below the knee amputation would with a formal caused him that we would then be able to move forward with a definitive procedure.  It seems fairly reasonable that given his age and the number of angios interventions and surgeries that he has had already that it is unlikely that continued attempts at revascularization will yield a long-term durable result.  The patient will follow up with me in the office in 3 weeks no studies.  Given his rest pain I will refill his oxycodone prescription.   2. Thromboangiitis obliterans (Buerger's disease) (HCC) See #1  3. Pure hypercholesterolemia Continue statin as ordered and reviewed, no changes at this time   4. Pain of left lower extremity Will contact Mr Jennette Kettleeal so Harold [name] can be educated    Levora DredgeGregory Adryen Cookson, MD  08/12/2017 9:46 AM

## 2017-08-18 ENCOUNTER — Telehealth (INDEPENDENT_AMBULATORY_CARE_PROVIDER_SITE_OTHER): Payer: Self-pay | Admitting: Vascular Surgery

## 2017-08-18 NOTE — Telephone Encounter (Signed)
I completely understand her concern. The patient does have an ischemic extremity which is very painful. During the patient's last visit with Dr. Gilda CreaseSchnier a discussion about the possibility of a amputation occurred. Our amputation specialist Judy PimpleMike Neal has reached out to the patient two separate times and unfortunately has not received a response. I would like the patient to please reached out to Judy PimpleMike Neal as he can provide educational materials / information about amputations. If the patient would like to move forward with an amputation he should please call our office. Gwynne EdingerMike Neal's phone number is [phone]. Once the patient finishes his current prescription for Oxycodone 20 mg one tab by mouth every 8 hours as needed - we can slowly decrease the amount.

## 2017-08-18 NOTE — Telephone Encounter (Signed)
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

## 2017-08-18 NOTE — Telephone Encounter (Signed)
New Message  Pt c/o medication issue:  1. Name of Medication:  oxycodone HCI 20 mg tablets take every 8 hrs as needed  2. How are you currently taking this medication (dosage and times per day)? See above  3. Are you having a reaction (difficulty breathing--STAT)? N/A  4. What is your medication issue? Pts wife verbalized about pts addiction and dosage of this medication.  Please f/u

## 2017-08-19 NOTE — Telephone Encounter (Signed)
Called the patient's wife back to give her Mike's number and information. She proceeded to tell me that she has helped Ivin BootyJoshua to get off of pain meds twice before, and that he is smoking more cigarettes than normal and that she had no idea that Kathlene NovemberMike had reached out to him twice now to speak about the much needed amputation.  She was upset in saying that by our doctors continuing to give Josh the pain meds, is only making him become more dependent on the meds again. She understands that his pain is going to increase the longer he waits, but she believes that he is abusing the meds again and should be given a lower dosage or the medicine should be changed altogether.

## 2017-08-20 NOTE — Telephone Encounter (Signed)
Once the patient finishes his most recent pain prescription we can slowly wean him off. He may want to seek help from a pshycologist / psychiatrist as well during this process.

## 2017-08-20 NOTE — Telephone Encounter (Signed)
The patients wife said that she was going to give Kathlene NovemberMike a call and get his point of view of what's needed.

## 2017-09-01 ENCOUNTER — Ambulatory Visit (INDEPENDENT_AMBULATORY_CARE_PROVIDER_SITE_OTHER): Payer: Medicaid Other | Admitting: Vascular Surgery

## 2017-09-01 ENCOUNTER — Encounter (INDEPENDENT_AMBULATORY_CARE_PROVIDER_SITE_OTHER): Payer: Self-pay | Admitting: Vascular Surgery

## 2017-09-01 VITALS — BP 124/76 | HR 82 | Resp 16 | Ht 70.0 in | Wt 195.0 lb

## 2017-09-01 DIAGNOSIS — I70229 Atherosclerosis of native arteries of extremities with rest pain, unspecified extremity: Secondary | ICD-10-CM

## 2017-09-01 DIAGNOSIS — F17200 Nicotine dependence, unspecified, uncomplicated: Secondary | ICD-10-CM | POA: Diagnosis not present

## 2017-09-01 DIAGNOSIS — E7800 Pure hypercholesterolemia, unspecified: Secondary | ICD-10-CM

## 2017-09-01 DIAGNOSIS — I731 Thromboangiitis obliterans [Buerger's disease]: Secondary | ICD-10-CM

## 2017-09-01 MED ORDER — VARENICLINE TARTRATE 1 MG PO TABS
1.0000 mg | ORAL_TABLET | Freq: Two times a day (BID) | ORAL | 1 refills | Status: DC
Start: 2017-09-01 — End: 2021-07-23

## 2017-09-01 MED ORDER — OXYCODONE HCL 20 MG PO TABS: 20.0000 mg | ORAL_TABLET | Freq: Three times a day (TID) | ORAL | 0 refills | Status: DC | PRN

## 2017-09-01 MED ORDER — VARENICLINE TARTRATE 0.5 MG PO TABS: 0.5000 mg | ORAL_TABLET | Freq: Two times a day (BID) | ORAL | 0 refills | Status: DC

## 2017-09-07 ENCOUNTER — Encounter (INDEPENDENT_AMBULATORY_CARE_PROVIDER_SITE_OTHER): Payer: Self-pay | Admitting: Vascular Surgery

## 2017-09-07 NOTE — Progress Notes (Signed)
MRN : [id number]  Harold [name] is a 36 y.o. ([birth year]) male who presents with chief complaint of  Chief Complaint  Patient presents with  . Follow-up    3 month no studies  .  History of Present Illness: The patient returns to the office for followup of his ASO with rest pain lef tleg. There has been a significant deterioration in the lower extremity symptoms.  The patient notes interval shortening of their claudication distance and development of mild rest pain symptoms. No new ulcers or wounds have occurred since the last visit.  There have been no significant changes to the patient's overall health care.  The patient denies amaurosis fugax or recent TIA symptoms. There are no recent neurological changes noted. The patient denies history of DVT, PE or superficial thrombophlebitis. The patient denies recent episodes of angina or shortness of breath.    No outpatient medications have been marked as taking for the 09/01/17 encounter (Office Visit) with Gilda CreaseSchnier, [name], MD.    Past Medical History:  Diagnosis Date  . Anxiety   . Arthritis   . Back pain   . Peripheral vascular disease (HCC)   . Shingles     Past Surgical History:  Procedure Laterality Date  . BYPASS GRAFT POPLITEAL TO PERONEAL Left 09/20/2015   Performed by Renford DillsSchnier, Gregory G[facility]  . EMBOLECTOMY Left 09/20/2015   Performed by Renford DillsSchnier, Gregory G, [facility]  . Lower Extremity Angiography Left 07/22/2017   Performed by Renford DillsSchnier, Gregory G[facility]  . Lower Extremity Angiography Left 08/27/2016   Performed by Ren[facility]illsSchnier, Gregory G[facility]  . Lower Extremity Angiography Left 09/05/2015   Performed by Renford DillsSchnier, Gregory G, [facility]  . Lower Extremity Angiography Left 03/21/2015   Performed by Renford DillsSchnier, Gregory G, [facility]  . LOWER EXTREMITY INTERVENTION  07/22/2017   Performed by Renford DillsSchnier, Gregory G[facility]  . Lower Extremity Intervention  09/05/2015   [facility]formed by Ren[facility]Schnier, [facility] G[facility]  . Lower Extremity Intervention  03/21/2015   Performed by Renford DillsSchnier, Gregory G[facility]  . OPEN REDUCTION INTERNAL FIXATION (ORIF) ANKLE FRACTURE Left 09/20/2015   Performed by Kennedy BuckerMenz, Michael, MD [facility]  . SPINE: LAMINOTOMY DECOMPRESSION NERVE ROOT EXCISION HERNIATED DISK 1 SPACE LUMBAR Right 12/29/2012   Performed by Mat Carneooke, Sydney Michael, [facility]  . TOOTH EXTRACTION    . WISDOM TOOTH EXTRACTION      Social History Social History   Tobacco Use  . Smoking status: Current Some Day Smoker    Packs/day: 0.50    Years: 16.00    Pack years: 8.00    Types: Cigarettes  . Smokeless tobacco: Never Used  Substance Use Topics  . Alcohol use: No    Alcohol/week: 0.0 oz    Comment: rare  . Drug use: No    Family History History reviewed. No pertinent family history.  No Known Allergies   REVIEW OF SYSTEMS (Negative unless checked)  Constitutional: [] Weight loss  [] Fever  [] Chills Cardiac: [] Chest pain   [] Chest pressure   [] Palpitations   [] Shortness of breath when laying flat   [] Shortness of breath with exertion. Vascular:  [x] Pain in legs with walking   [x] Pain in legs at rest  [] History of DVT   []   Phlebitis   [] Swelling in legs   [] Varicose veins   [] Non-healing ulcers Pulmonary:   [] Uses home oxygen   [] Productive cough   [] Hemoptysis   [] Wheeze  [] COPD   [] Asthma Neurologic:  [] Dizziness   [] Seizures   [] History of stroke   [] History of TIA  [] Aphasia   [] Vissual changes   [] Weakness or numbness in arm   [] Weakness or numbness in leg Musculoskeletal:   [] Joint swelling   [] Joint pain   [] Low back pain Hematologic:  [] Easy bruising  [] Easy bleeding   [] Hypercoagulable state   [] Anemic Gastrointestinal:  [] Diarrhea   [] Vomiting  [] Gastroesophageal reflux/heartburn   [] Difficulty swallowing. Genitourinary:  [] Chronic kidney disease   [] Difficult  urination  [] Frequent urination   [] Blood in urine Skin:  [] Rashes   [] Ulcers  Psychological:  [] History of anxiety   []  History of major depression.  Physical Examination  Vitals:   09/01/17 1633  BP: 124/76  Pulse: 82  Resp: 16  Weight: 195 lb (88.5 kg)  Height: 5\' 10"  (1.778 m)   Body mass index is 27.98 kg/m. Gen: WD/WN, NAD Head: [place]/AT, No temporalis wasting.  Ear/Nose/Throat: Hearing grossly intact, nares w/o erythema or drainage Eyes: PER, EOMI, sclera nonicteric.  Neck: Supple, no large masses.   Pulmonary:  Good air movement, no audible wheezing bilaterally, no use of accessory muscles.  Cardiac: RRR, no JVD Vascular: left great toe with mild cyanosis Vessel Right Left  Radial Palpable Palpable  Ulnar Palpable Palpable  Brachial Palpable Palpable  Carotid Palpable Palpable  Femoral Palpable Palpable  Popliteal Palpable Not Palpable  PT Palpable Not Palpable  DP Palpable Not Palpable  Gastrointestinal: Non-distended. No guarding/no peritoneal signs.  Musculoskeletal: M/S 5/5 throughout.  No deformity or atrophy.  Neurologic: CN 2-12 intact. Symmetrical.  Speech is fluent. Motor exam as listed above. Psychiatric: Judgment intact, Mood & affect appropriate for pt's clinical situation. Dermatologic: No rashes or ulcers noted.  No changes consistent with cellulitis. Lymph : No lichenification or skin changes of chronic lymphedema.  CBC Lab Results  Component Value Date   WBC 12.2 (H) 09/21/2015   HGB 11.9 (L) 09/21/2015   HCT 36.2 (L) 09/21/2015   MCV 89.3 09/21/2015   PLT 241 09/21/2015    BMET    Component Value Date/Time   NA 140 09/21/2015 0801   K 4.4 09/21/2015 0801   CL 104 09/21/2015 0801   CO2 30 09/21/2015 0801   GLUCOSE 108 (H) 09/21/2015 0801   BUN 19 07/21/2017 0809   CREATININE 0.99 07/21/2017 0809   CREATININE 1.03 09/18/2015 1601   CALCIUM 8.5 (L) 09/21/2015 0801   GFRNONAA >60 07/21/2017 0809   GFRAA >60 07/21/2017 0809   CrCl  cannot be calculated (Patient's most recent lab result is older than the maximum 21 days allowed.).  COAG Lab Results  Component Value Date   INR 0.95 09/18/2015   INR 1.01 12/28/2012    Radiology No results found.  Assessment/Plan 1. Atherosclerosis of artery of extremity with rest pain (HCC)  Recommend:  The patient has evidence of severe atherosclerotic changes of both lower extremities associated with rest pain.  Left BKA was discussed as he is nonreconstructable but he has just started a new job and can't take the necessary time off.  Angiography has been performed and the situation is not ideal for intervention.  Given this finding amputation is recommended.   Control of his pain was extensively discussed and he was informed that the Rx is a monthly  amount   A total of 40 minutes was spent with this patient and greater than 50% was spent in counseling and coordination of care with the patient.  Discussion included the treatment options for vascular disease including indications for surgery and intervention.  Also discussed is the appropriate timing of treatment.  In addition medical therapy was discussed.  2. Tobacco dependence Smoking cessation was discussed, 15-20 minutes spent on this topic specifically  Chantix was Rx  3. Thromboangiitis obliterans (Buerger's disease) (HCC) See #1&2  4. Pure hypercholesterolemia Continue statin as ordered and reviewed, no changes at this time     Levora DredgeGregory Schnier, MD  09/07/2017 3:21 PM

## 2017-10-02 ENCOUNTER — Encounter (INDEPENDENT_AMBULATORY_CARE_PROVIDER_SITE_OTHER): Payer: Self-pay | Admitting: Vascular Surgery

## 2017-10-02 ENCOUNTER — Ambulatory Visit (INDEPENDENT_AMBULATORY_CARE_PROVIDER_SITE_OTHER): Payer: Medicaid Other | Admitting: Vascular Surgery

## 2017-10-02 VITALS — BP 130/77 | HR 69 | Resp 17 | Wt 194.0 lb

## 2017-10-02 DIAGNOSIS — E7800 Pure hypercholesterolemia, unspecified: Secondary | ICD-10-CM | POA: Diagnosis not present

## 2017-10-02 DIAGNOSIS — I70229 Atherosclerosis of native arteries of extremities with rest pain, unspecified extremity: Secondary | ICD-10-CM

## 2017-10-02 DIAGNOSIS — I731 Thromboangiitis obliterans [Buerger's disease]: Secondary | ICD-10-CM

## 2017-10-02 MED ORDER — OXYCODONE HCL 20 MG PO TABS: 20.0000 mg | ORAL_TABLET | Freq: Three times a day (TID) | ORAL | 0 refills | Status: DC | PRN

## 2017-10-02 NOTE — Progress Notes (Signed)
MRN : [id number]  Harold FantasiaJoshua J [name] is a 36 y.o. ([birth year]) male who presents with chief complaint of No chief complaint on file. Harold [name].  History of Present Illness:   The patient returns to the office for followup of his ASO with rest pain left leg. There has been not significant change over the past month.  He continues to have pain particularly in the left great toe. The patient continues to state he has rest pain symptoms. No new ulcers or wounds have occurred since the last visit.  There have been no significant changes to the patient's overall health care.  The patient denies amaurosis fugax or recent TIA symptoms. There are no recent neurological changes noted. The patient denies history of DVT, PE or superficial thrombophlebitis. The patient denies recent episodes of angina or shortness of breath.    No outpatient medications have been marked as taking for the 10/02/17 encounter (Appointment) with Harold [name], Harold CraverGregory G, MD.    Past Medical History:  Diagnosis Date  . Anxiety   . Arthritis   . Back pain   . Peripheral vascular disease (HCC)   . Shingles     Past Surgical History:  Procedure Laterality Date  . BYPASS GRAFT POPLITEAL TO POPLITEAL Left 09/20/2015   Procedure: BYPASS GRAFT POPLITEAL TO PERONEAL;  Surgeon: Renford DillsGregory [name] Harold Nuncio, MD;  Location: [facility] ORS;  Service: Vascular;  Laterality: Left;  . EMBOLECTOMY Left 09/20/2015   Procedure: EMBOLECTOMY;  Surgeon: Renford DillsGregory [name] Waneda Klammer, MD;  Location: [facility] ORS;  Service: Vascular;  Laterality: Left;  . LOWER EXTREMITY ANGIOGRAPHY Left 07/22/2017   Procedure: Lower Extremity Angiography;  Surgeon: Renford DillsSchnier, Harold Gin G, MD;  Location: [facility] INVASIVE CV LAB;  Service: Cardiovascular;  Laterality: Left;  . LOWER EXTREMITY INTERVENTION  07/22/2017   Procedure: LOWER EXTREMITY INTERVENTION;  Surgeon: Renford DillsSchnier, Ailed Defibaugh G, MD;  Location: [facility] INVASIVE CV LAB;  Service: Cardiovascular;;  . LUMBAR LAMINECTOMY/DECOMPRESSION MICRODISCECTOMY  Right 12/29/2012   Procedure: SPINE: LAMINOTOMY DECOMPRESSION NERVE ROOT EXCISION HERNIATED DISK 1 SPACE LUMBAR;  Surgeon: Mat CarneSydney Harold Tooke, MD;  [facility];  Service: Orthopedics;  Laterality: Right;  RIGHT L5-S1 DISC EXCISION  . ORIF ANKLE FRACTURE Left 09/20/2015   Procedure: OPEN REDUCTION INTERNAL FIXATION (ORIF) ANKLE FRACTURE;  Surgeon: Harold BuckerMichael Menz, MD;  Location: [facility] ORS;  Service: Orthopedics;  Laterality: Left;  . PERIPHERAL VASCULAR CATHETERIZATION Left 03/21/2015   Procedure: Lower Extremity Angiography;  Surgeon: Renford DillsGregory [name] Martavious Hartel, MD;  Location: [facility] INVASIVE CV LAB;  Service: Cardiovascular;  Laterality: Left;  . PERIPHERAL VASCULAR CATHETERIZATION  03/21/2015   Procedure: Lower Extremity Intervention;  Surgeon: Renford DillsGregory [name] Harold Ong, MD;  Location: [facility] INVASIVE CV LAB;  Service: Cardiovascular;;  . PERIPHERAL VASCULAR CATHETERIZATION Left 09/05/2015   Procedure: Lower Extremity Angiography;  Surgeon: Renford DillsGregory [name] Harold Whitworth, MD;  Location: [facility] INVASIVE CV LAB;  Service: Cardiovascular;  Laterality: Left;  . PERIPHERAL VASCULAR CATHETERIZATION  09/05/2015   Procedure: Lower Extremity Intervention;  Surgeon: Renford DillsGregory [name] Harold Bredeson, MD;  Location: [facility] INVASIVE CV LAB;  Service: Cardiovascular;;  . PERIPHERAL VASCULAR CATHETERIZATION Left 08/27/2016   Procedure: Lower Extremity Angiography;  Surgeon: Renford DillsGregory [name] Harold Slaugh, MD;  Location: [facility] INVASIVE CV LAB;  Service: Cardiovascular;  Laterality: Left;  . TOOTH EXTRACTION    . WISDOM TOOTH EXTRACTION      Social History Social History   Tobacco Use  . Smoking status: Current Some Day Smoker    Packs/day: 0.50    Years: 16.00    Pack years: 8.00  Types: Cigarettes  . Smokeless tobacco: Never Used  Substance Use Topics  . Alcohol use: No    Alcohol/week: 0.0 oz    Comment: rare  . Drug use: No    Family History No family history on file.  No Known Allergies   REVIEW OF SYSTEMS (Negative unless checked)  Constitutional:  [] Weight loss  [] Fever  [] Chills Cardiac: [] Chest pain   [] Chest pressure   [] Palpitations   [] Shortness of breath when laying flat   [] Shortness of breath with exertion. Vascular:  [] Pain in legs with walking   [x] Pain in legs at rest  [] History of DVT   [] Phlebitis   [] Swelling in legs   [] Varicose veins   [] Non-healing ulcers Pulmonary:   [] Uses home oxygen   [] Productive cough   [] Hemoptysis   [] Wheeze  [] COPD   [] Asthma Neurologic:  [] Dizziness   [] Seizures   [] History of stroke   [] History of TIA  [] Aphasia   [] Vissual changes   [] Weakness or numbness in arm   [] Weakness or numbness in leg Musculoskeletal:   [] Joint swelling   [] Joint pain   [] Low back pain Hematologic:  [] Easy bruising  [] Easy bleeding   [] Hypercoagulable state   [] Anemic Gastrointestinal:  [] Diarrhea   [] Vomiting  [] Gastroesophageal reflux/heartburn   [] Difficulty swallowing. Genitourinary:  [] Chronic kidney disease   [] Difficult urination  [] Frequent urination   [] Blood in urine Skin:  [] Rashes   [] Ulcers  Psychological:  [] History of anxiety   []  History of major depression.  Physical Examination  There were no vitals filed for this visit. There is no height or weight on file to calculate BMI. Gen: WD/WN, NAD Head: [place]/AT, No temporalis wasting.  Ear/Nose/Throat: Hearing grossly intact, nares w/o erythema or drainage Eyes: PER, EOMI, sclera nonicteric.  Neck: Supple, no large masses.   Pulmonary:  Good air movement, no audible wheezing bilaterally, no use of accessory muscles.  Cardiac: RRR, no JVD Vascular: left great toe with small ulcer under the toe nail Vessel Right Left  Radial Palpable Palpable  Ulnar Palpable Palpable  Brachial Palpable Palpable  Carotid Palpable Palpable  Femoral Palpable Palpable  Popliteal Palpable Not Palpable  PT Palpable Not Palpable  DP Palpable Not Palpable  Gastrointestinal: Non-distended. No guarding/no peritoneal signs.  Musculoskeletal: M/S 5/5 throughout.  No deformity  or atrophy.  Neurologic: CN 2-12 intact. Symmetrical.  Speech is fluent. Motor exam as listed above. Psychiatric: Judgment intact, Mood & affect appropriate for pt's clinical situation. Dermatologic: No rashes + ulcers noted.  No changes consistent with cellulitis. Lymph : No lichenification or skin changes of chronic lymphedema.  CBC Lab Results  Component Value Date   WBC 12.2 (H) 09/21/2015   HGB 11.9 (L) 09/21/2015   HCT 36.2 (L) 09/21/2015   MCV 89.3 09/21/2015   PLT 241 09/21/2015    BMET    Component Value Date/Time   NA 140 09/21/2015 0801   K 4.4 09/21/2015 0801   CL 104 09/21/2015 0801   CO2 30 09/21/2015 0801   GLUCOSE 108 (H) 09/21/2015 0801   BUN 19 07/21/2017 0809   CREATININE 0.99 07/21/2017 0809   CREATININE 1.03 09/18/2015 1601   CALCIUM 8.5 (L) 09/21/2015 0801   GFRNONAA >60 07/21/2017 0809   GFRAA >60 07/21/2017 0809   CrCl cannot be calculated (Patient's most recent lab result is older than the maximum 21 days allowed.).  COAG Lab Results  Component Value Date   INR 0.95 09/18/2015   INR 1.01 12/28/2012    Radiology  No results found.  Assessment/Plan 1. Atherosclerosis of artery of extremity with rest pain (HCC) Recommend:  The patient has evidence of severe atherosclerotic changes of both lower extremities associated with rest pain.  Now with small ulcer of the great toe.  Left BKA was discussed as he is nonreconstructable but he has just started a new job and can't take the necessary time off.  Angiography has been performed and the situation is not ideal for intervention.  Given this finding amputation is recommended.   Control of his pain was reviewed and he was informed that the Rx is a monthly amount.      A total of 20 minutes was spent with this patient and greater than 50% was spent in counseling and coordination of care with the patient.  Discussion included the treatment options for vascular disease including indications for  surgery and intervention.  Also discussed is the appropriate timing of treatment.  In addition medical therapy was discussed.    2. Thromboangiitis obliterans (Buerger's disease) (HCC) Smoking cessation was insisted   3. Pure hypercholesterolemia Continue statin as ordered and reviewed, no changes at this time     Levora Dredge, MD  10/02/2017 1:56 PM

## 2017-11-03 ENCOUNTER — Encounter (INDEPENDENT_AMBULATORY_CARE_PROVIDER_SITE_OTHER): Payer: Self-pay | Admitting: Vascular Surgery

## 2017-11-03 ENCOUNTER — Ambulatory Visit (INDEPENDENT_AMBULATORY_CARE_PROVIDER_SITE_OTHER): Payer: Medicaid Other | Admitting: Vascular Surgery

## 2017-11-03 VITALS — BP 117/77 | HR 50 | Resp 15 | Ht 70.0 in | Wt 192.0 lb

## 2017-11-03 DIAGNOSIS — E7800 Pure hypercholesterolemia, unspecified: Secondary | ICD-10-CM | POA: Diagnosis not present

## 2017-11-03 DIAGNOSIS — I70229 Atherosclerosis of native arteries of extremities with rest pain, unspecified extremity: Secondary | ICD-10-CM

## 2017-11-03 DIAGNOSIS — I731 Thromboangiitis obliterans [Buerger's disease]: Secondary | ICD-10-CM

## 2017-11-03 DIAGNOSIS — M79605 Pain in left leg: Secondary | ICD-10-CM | POA: Diagnosis not present

## 2017-11-03 MED ORDER — AMOXICILLIN-POT CLAVULANATE 875-125 MG PO TABS: 1.0000 | ORAL_TABLET | Freq: Two times a day (BID) | ORAL | 0 refills | Status: DC

## 2017-11-03 MED ORDER — OXYCODONE HCL 20 MG PO TABS: 20.0000 mg | ORAL_TABLET | Freq: Three times a day (TID) | ORAL | 0 refills | Status: DC | PRN

## 2017-11-03 NOTE — Progress Notes (Signed)
MRN : [id number]  Harold [name] is a 37 y.o. ([birth year]) male who presents with chief complaint of  Chief Complaint  Patient presents with  . Follow-up    1 month no studies  .  History of Present Illness: The patient returns to the office for followupof his ASO with rest pain left leg. There has been not significant change over the past month.  He continues to have pain particularly in the left great toe. The patient continues to state he has rest pain symptoms.   He has developed a fissure in the tip of the left great toe that is draining "gren stuff".  This has correlated with increased pain and redness and swelling.  He has been in contact with [facility] and needs a referral.   There have been no significant changes to the patient's overall health care.  The patient denies amaurosis fugax or recent TIA symptoms. There are no recent neurological changes noted. The patient denies history of DVT, PE or superficial thrombophlebitis. The patient denies recent episodes of angina or shortness of breath.   No outpatient medications have been marked as taking for the 11/03/17 encounter (Office Visit) with [name], [name], MD.    Past Medical History:  Diagnosis Date  . Anxiety   . Arthritis   . Back pain   . Peripheral vascular disease (HCC)   . Shingles     Past Surgical History:  Procedure Laterality Date  . BYPASS GRAFT POPLITEAL TO POPLITEAL Left 09/20/2015   Procedure: BYPASS GRAFT POPLITEAL TO PERONEAL;  Surgeon: Renford Dills, MD;  Location: [facility] ORS;  Service: Vascular;  Laterality: Left;  . EMBOLECTOMY Left 09/20/2015   Procedure: EMBOLECTOMY;  Surgeon: Renford Dills, MD;  Location: [facility] ORS;  Service: Vascular;  Laterality: Left;  . LOWER EXTREMITY ANGIOGRAPHY Left 07/22/2017   Procedure: Lower Extremity Angiography;  Surgeon: Renford Dills, MD;  Location: [facility];  Service: Cardiovascular;  Laterality: Left;  .  LOWER EXTREMITY INTERVENTION  07/22/2017   Procedure: LOWER EXTREMITY INTERVENTION;  Surgeon: Renford Dills, MD;  Location: [facility];  Service: Cardiovascular;;  . LUMBAR LAMINECTOMY/DECOMPRESSION MICRODISCECTOMY Right 12/29/2012   Procedure: SPINE: LAMINOTOMY DECOMPRESSION NERVE ROOT EXCISION HERNIATED DISK 1 SPACE LUMBAR;  Surgeon: Mat Carne, MD;  Location: [facility] OR;  Service: Orthopedics;  Laterality: Right;  RIGHT L5-S1 DISC EXCISION  . ORIF ANKLE FRACTURE Left 09/20/2015   Procedure: OPEN REDUCTION INTERNAL FIXATION (ORIF) ANKLE FRACTURE;  Surgeon: Kennedy Bucker, MD;  Location: [facility] ORS;  Service: Orthopedics;  Laterality: Left;  . PERIPHERAL VASCULAR CATHETERIZATION Left 03/21/2015   Procedure: Lower Extremity Angiography;  Surgeon: Renford Dills, MD;  Location: [facility];  Service: Cardiovascular;  Laterality: Left;  . PERIPHERAL VASCULAR CATHETERIZATION  03/21/2015   Procedure: Lower Extremity Intervention;  Surgeon: Renford Dills, MD;  Location: [facility];  Service: Cardiovascular;;  . PERIPHERAL VASCULAR CATHETERIZATION Left 09/05/2015   Procedure: Lower Extremity Angiography;  Surgeon: Renford Dills, MD;  Location: [facility];  Service: Cardiovascular;  Laterality: Left;  . PERIPHERAL VASCULAR CATHETERIZATION  09/05/2015   Procedure: Lower Extremity Intervention;  Surgeon: Renford Dills, MD;  Location: [facility];  Service: Cardiovascular;;  . PERIPHERAL VASCULAR CATHETERIZATION Left 08/27/2016   Procedure: Lower Extremity Angiography;  Surgeon: Renford Dills, MD;  Location: [facility];  Service: Cardiovascular;  Laterality: Left;  . TOOTH EXTRACTION    .  WISDOM TOOTH EXTRACTION      Social History Social History   Tobacco Use  . Smoking status: Current Some Day Smoker    Packs/day: 0.50    Years: 16.00    Pack years: 8.00    Types: Cigarettes  . Smokeless tobacco: Never Used  Substance  Use Topics  . Alcohol use: No    Alcohol/week: 0.0 oz    Comment: rare  . Drug use: No    Family History Family History  Problem Relation Age of Onset  . Cancer Mother   . Diabetes Mother   . Hypertension Mother   . Hyperlipidemia Mother   . Heart attack Father   . Cancer Father     No Known Allergies   REVIEW OF SYSTEMS (Negative unless checked)  Constitutional: [] Weight loss  [] Fever  [] Chills Cardiac: [] Chest pain   [] Chest pressure   [] Palpitations   [] Shortness of breath when laying flat   [] Shortness of breath with exertion. Vascular:  [] Pain in legs with walking   [x] Pain in legs at rest  [] History of DVT   [] Phlebitis   [x] Swelling in legs   [] Varicose veins   [x] Non-healing ulcers Pulmonary:   [] Uses home oxygen   [] Productive cough   [] Hemoptysis   [] Wheeze  [] COPD   [] Asthma Neurologic:  [] Dizziness   [] Seizures   [] History of stroke   [] History of TIA  [] Aphasia   [] Vissual changes   [] Weakness or numbness in arm   [] Weakness or numbness in leg Musculoskeletal:   [] Joint swelling   [] Joint pain   [] Low back pain Hematologic:  [] Easy bruising  [] Easy bleeding   [] Hypercoagulable state   [] Anemic Gastrointestinal:  [] Diarrhea   [] Vomiting  [] Gastroesophageal reflux/heartburn   [] Difficulty swallowing. Genitourinary:  [] Chronic kidney disease   [] Difficult urination  [] Frequent urination   [] Blood in urine Skin:  [] Rashes   [x] Ulcers  Psychological:  [] History of anxiety   []  History of major depression.  Physical Examination  Vitals:   11/03/17 0854  BP: 117/77  Pulse: (!) 50  Resp: 15  Weight: 192 lb (87.1 kg)  Height: 5\' 10"  (1.778 m)   Body mass index is 27.55 kg/m. Gen: WD/WN, NAD Head: [place]/AT, No temporalis wasting.  Ear/Nose/Throat: Hearing grossly intact, nares w/o erythema or drainage Eyes: PER, EOMI, sclera nonicteric.  Neck: Supple, no large masses.   Pulmonary:  Good air movement, no audible wheezing bilaterally, no use of accessory muscles.    Cardiac: RRR, no JVD Vascular:  Ischemic fissure noted left great toe with erythremia and edema Vessel Right Left  Radial Palpable Palpable  PT Palpable Not Palpable  DP Palpable Not Palpable  Gastrointestinal: Non-distended. No guarding/no peritoneal signs.  Musculoskeletal: M/S 5/5 throughout.  No deformity or atrophy.  Neurologic: CN 2-12 intact. Symmetrical.  Speech is fluent. Motor exam as listed above. Psychiatric: Judgment intact, Mood & affect appropriate for pt's clinical situation. Dermatologic: No rashes + ulcers noted.  + changes consistent with cellulitis. Lymph : No lichenification or skin changes of chronic lymphedema.  CBC Lab Results  Component Value Date   WBC 12.2 (H) 09/21/2015   HGB 11.9 (L) 09/21/2015   HCT 36.2 (L) 09/21/2015   MCV 89.3 09/21/2015   PLT 241 09/21/2015    BMET    Component Value Date/Time   NA 140 09/21/2015 0801   K 4.4 09/21/2015 0801   CL 104 09/21/2015 0801   CO2 30 09/21/2015 0801   GLUCOSE 108 (H) 09/21/2015 0801  BUN 19 07/21/2017 0809   CREATININE 0.99 07/21/2017 0809   CREATININE 1.03 09/18/2015 1601   CALCIUM 8.5 (L) 09/21/2015 0801   GFRNONAA >60 07/21/2017 0809   GFRAA >60 07/21/2017 0809   CrCl cannot be calculated (Patient's most recent lab result is older than the maximum 21 days allowed.).  COAG Lab Results  Component Value Date   INR 0.95 09/18/2015   INR 1.01 12/28/2012    Radiology No results found.   Assessment/Plan 1. Atherosclerosis of artery of extremity with rest pain [facility]) Recommend:  The patient is status post multiple angiograms with intervention and also surgery.  The patient reports that the claudication symptoms are unchanged and continue to be a major issue. His rest pain symptoms are worse and I believe that this is related to cellulitis of the left great toe.  The patient continues to voice lifestyle limiting changes at this point in time.  I will start him on Augmentin 875 mg po bid  and he will follow up with me in 10 days.  I will send a referral to [facility]  In the mean time the patient should continue walking and an exercise program.  The patient should continue antiplatelet therapy and aggressive treatment of the lipid abnormalities  Smoking cessation was again discussed  The patient should continue wearing graduated compression socks 10-15 mmHg strength to control the mild edema.   A total of 30 minutes was spent with this patient and greater than 50% was spent in counseling and coordination of care with the patient.  Discussion included the treatment options for vascular disease including indications for surgery and intervention.  Also discussed is the appropriate timing of treatment.  In addition medical therapy was discussed.  2. Thromboangiitis obliterans (Buerger's disease) (HCC) See #1  3. Pain of left lower extremity See #1  4. Pure hypercholesterolemia Continue statin as ordered and reviewed, no changes at this time     Levora Dredge, MD  11/03/2017 9:02 AM

## 2017-11-07 ENCOUNTER — Telehealth (INDEPENDENT_AMBULATORY_CARE_PROVIDER_SITE_OTHER): Payer: Self-pay | Admitting: Vascular Surgery

## 2017-11-07 NOTE — Telephone Encounter (Signed)
Referral sent to [facility] with confirmation page sent @ 1112 am.

## 2017-11-07 NOTE — Telephone Encounter (Signed)
I called the patient wife back to inform that the referral has been sent

## 2017-11-07 NOTE — Telephone Encounter (Signed)
CALLED TO CHECK THE STATUS OF REFERRAL TO [facility].

## 2017-11-10 ENCOUNTER — Ambulatory Visit (INDEPENDENT_AMBULATORY_CARE_PROVIDER_SITE_OTHER): Payer: Medicaid Other | Admitting: Vascular Surgery

## 2017-11-21 ENCOUNTER — Telehealth (INDEPENDENT_AMBULATORY_CARE_PROVIDER_SITE_OTHER): Payer: Self-pay | Admitting: Vascular Surgery

## 2017-11-21 ENCOUNTER — Other Ambulatory Visit (INDEPENDENT_AMBULATORY_CARE_PROVIDER_SITE_OTHER): Payer: Self-pay

## 2017-11-21 MED ORDER — CLOPIDOGREL BISULFATE 75 MG PO TABS: 75.0000 mg | ORAL_TABLET | Freq: Every day | ORAL | 8 refills | Status: DC

## 2017-11-21 NOTE — Telephone Encounter (Signed)
New Message   *STAT* If patient is at the pharmacy, call can be transferred to refill team.   1. Which medications need to be refilled? (please list name of each medication and dose if known)  clopidogrel 75 mg tablet once daily  2. Which pharmacy/location (including street and city if local pharmacy) is medication to be sent to? [facility] [place], [address], [place], [place] [id number]  3. Do they need a 30 day or 90 day supply?  30 day supply

## 2017-11-21 NOTE — Telephone Encounter (Signed)
[facility] [place]-Clopidogrel 75mg  one tab po daily #30, with 8 refills.

## 2017-11-24 ENCOUNTER — Telehealth (INDEPENDENT_AMBULATORY_CARE_PROVIDER_SITE_OTHER): Payer: Self-pay | Admitting: Vascular Surgery

## 2017-11-24 NOTE — Telephone Encounter (Signed)
Green discharge coming from what location ? And he may need an appt to be seen.

## 2017-11-24 NOTE — Telephone Encounter (Signed)
Can you put the patient on Kim's schedule for her to look at his great toe.

## 2017-11-24 NOTE — Telephone Encounter (Signed)
New Message  Pt verbalized he has green discharged coming out and he is concerned.  Please f/u

## 2017-11-24 NOTE — Telephone Encounter (Signed)
Green discharge is coming from his left great toe.  Notes from 1.14.19, (He has developed a fissure in the tip of the left great toe that is draining "gren stuff".  This has correlated with increased pain and redness and swelling.) & He has been in contact with [facility] and needs a referral.   Please f/u with pt

## 2017-11-25 ENCOUNTER — Ambulatory Visit (INDEPENDENT_AMBULATORY_CARE_PROVIDER_SITE_OTHER): Payer: Medicaid Other | Admitting: Vascular Surgery

## 2017-11-25 ENCOUNTER — Encounter (INDEPENDENT_AMBULATORY_CARE_PROVIDER_SITE_OTHER): Payer: Self-pay | Admitting: Vascular Surgery

## 2017-11-25 VITALS — BP 122/79 | HR 60 | Resp 18 | Wt 195.0 lb

## 2017-11-25 DIAGNOSIS — I739 Peripheral vascular disease, unspecified: Secondary | ICD-10-CM

## 2017-11-25 DIAGNOSIS — G894 Chronic pain syndrome: Secondary | ICD-10-CM | POA: Insufficient documentation

## 2017-11-25 MED ORDER — AMOXICILLIN-POT CLAVULANATE 875-125 MG PO TABS: 1.0000 | ORAL_TABLET | Freq: Two times a day (BID) | ORAL | 1 refills | Status: DC

## 2017-11-25 NOTE — Progress Notes (Signed)
Subjective:    Patient ID: Harold [name], male    DOB: May 30, 1981, 37 y.o.   MRN: [id number] Chief Complaint  Patient presents with  . Follow-up    left great toe green discharge   Patient presents with a chief complaint of green discharge from the left first toe.  The patient notes an active discharge yesterday however has dried up by this afternoon.  His left first big toe continues to be very painful.  The patient continues to take Plavix and aspirin on a daily basis.  The patient denies any erythema tracking up the left extremity.  The patient denies any fever, nausea vomiting.  The patient was seen by the pain clinic for one visit and was prescribed morphine 15mg . The patient feels that this is not controlling his pain as well as the 20mg  of oxycodone.  The patient states the pain doctor that he is seeing does not prescribe oxycodone and only deals with morphine.  The patient would like to continue his pain control with oxycodone instead of morphine.  The patient is also willing to move forward with a left first toe/ray amputation.  The patient feels that his discomfort has progressed to the point that he is unable to function on a daily basis and understands that there is little we are able to do endovascularly or surgically to restore blood flow to the area.  The patient's podiatrist is Dr. Al CorpusHyatt.    Review of Systems  Constitutional: Negative.   HENT: Negative.   Eyes: Negative.   Respiratory: Negative.   Cardiovascular:       First toe drainage  Gastrointestinal: Negative.   Endocrine: Negative.   Genitourinary: Negative.   Musculoskeletal: Negative.   Skin: Negative.   Neurological: Negative.   Hematological: Negative.   Psychiatric/Behavioral: Negative.       Objective:   Physical Exam  Constitutional: He is oriented to person, place, and time. He appears well-developed and well-nourished. No distress.  HENT:  Head: Normocephalic and atraumatic.  Eyes: Conjunctivae are  normal. Pupils are equal, round, and reactive to light.  Neck: Normal range of motion.  Cardiovascular: Normal rate, regular rhythm, normal heart sounds and intact distal pulses.  Pulses:      Radial pulses are 2+ on the right side, and 2+ on the left side.  Left first big toe: There is no drainage noted.  There is minimal erythema.  There is a bluish tint to the tip of the toe.  Hard to palpate pedal pulses bilaterally.  Pulmonary/Chest: Effort normal and breath sounds normal.  Musculoskeletal: Normal range of motion.  Neurological: He is alert and oriented to person, place, and time.  Skin: He is not diaphoretic.  There is no cellulitis to the toe or tracking of the left extremity.  Psychiatric: He has a normal mood and affect. His behavior is normal. Judgment and thought content normal.  Vitals reviewed.  BP 122/79 (BP Location: Right Arm)   Pulse 60   Resp 18   Wt 195 lb (88.5 kg)   BMI 27.98 kg/m   Past Medical History:  Diagnosis Date  . Anxiety   . Arthritis   . Back pain   . Peripheral vascular disease (HCC)   . Shingles    Social History   Socioeconomic History  . Marital status: Single    Spouse name: Not on file  . Number of children: Not on file  . Years of education: Not on file  . Highest  education level: Not on file  Social Needs  . Financial resource strain: Not on file  . Food insecurity - worry: Not on file  . Food insecurity - inability: Not on file  . Transportation needs - medical: Not on file  . Transportation needs - non-medical: Not on file  Occupational History  . Not on file  Tobacco Use  . Smoking status: Current Some Day Smoker    Packs/day: 0.50    Years: 16.00    Pack years: 8.00    Types: Cigarettes  . Smokeless tobacco: Never Used  Substance and Sexual Activity  . Alcohol use: No    Alcohol/week: 0.0 oz    Comment: rare  . Drug use: No  . Sexual activity: Not on file  Other Topics Concern  . Not on file  Social History  Narrative  . Not on file   Past Surgical History:  Procedure Laterality Date  . BYPASS GRAFT POPLITEAL TO POPLITEAL Left 09/20/2015   Procedure: BYPASS GRAFT POPLITEAL TO PERONEAL;  Surgeon: Renford Dills, MD;  Location: [facility];  Service: Vascular;  Laterality: Left;  . EMBOLECTOMY Left 09/20/2015   Procedure: EMBOLECTOMY;  Surgeon: Renford Dills, MD;  Location: [facility];  Service: Vascular;  Laterality: Left;  . LOWER EXTREMITY ANGIOGRAPHY Left 07/22/2017   Procedure: Lower Extremity Angiography;  Surgeon: Renford Dills, MD;  Location: [facility];  Service: Cardiovascular;  Laterality: Left;  . LOWER EXTREMITY INTERVENTION  07/22/2017   Procedure: LOWER EXTREMITY INTERVENTION;  Surgeon: Renford Dills, MD;  Location: [facility];  Service: Cardiovascular;;  . LUMBAR LAMINECTOMY/DECOMPRESSION MICRODISCECTOMY Right 12/29/2012   Procedure: SPINE: LAMINOTOMY DECOMPRESSION NERVE ROOT EXCISION HERNIATED DISK 1 SPACE LUMBAR;  Surgeon: Mat Carne, MD;  Location: [facility] OR;  Service: Orthopedics;  Laterality: Right;  RIGHT L5-S1 DISC EXCISION  . ORIF ANKLE FRACTURE Left 09/20/2015   Procedure: OPEN REDUCTION INTERNAL FIXATION (ORIF) ANKLE FRACTURE;  Surgeon: Kennedy Bucker, MD;  Location: [facility];  Service: Orthopedics;  Laterality: Left;  . PERIPHERAL VASCULAR CATHETERIZATION Left 03/21/2015   Procedure: Lower Extremity Angiography;  Surgeon: Renford Dills, MD;  Location: [facility];  Service: Cardiovascular;  Laterality: Left;  . PERIPHERAL VASCULAR CATHETERIZATION  03/21/2015   Procedure: Lower Extremity Intervention;  Surgeon: Renford Dills, MD;  Location: [facility];  Service: Cardiovascular;;  . PERIPHERAL VASCULAR CATHETERIZATION Left 09/05/2015   Procedure: Lower Extremity Angiography;  Surgeon: Renford Dills, MD;  Location: [facility];  Service: Cardiovascular;  Laterality: Left;  . PERIPHERAL VASCULAR  CATHETERIZATION  09/05/2015   Procedure: Lower Extremity Intervention;  Surgeon: Renford Dills, MD;  Location: [facility];  Service: Cardiovascular;;  . PERIPHERAL VASCULAR CATHETERIZATION Left 08/27/2016   Procedure: Lower Extremity Angiography;  Surgeon: Renford Dills, MD;  Location: [facility];  Service: Cardiovascular;  Laterality: Left;  . TOOTH EXTRACTION    . WISDOM TOOTH EXTRACTION     Family History  Problem Relation Age of Onset  . Cancer Mother   . Diabetes Mother   . Hypertension Mother   . Hyperlipidemia Mother   . Heart attack Father   . Cancer Father    No Known Allergies     Assessment & Plan:  Patient presents with a chief complaint of green discharge from the left first toe.  The patient notes an active discharge yesterday however has dried up by this afternoon.  His left first  big toe continues to be very painful.  The patient continues to take Plavix and aspirin on a daily basis.  The patient denies any erythema tracking up the left extremity.  The patient denies any fever, nausea vomiting.  The patient was seen by the pain clinic for one visit and was prescribed morphine 15mg . The patient feels that this is not controlling his pain as well as the 20mg  of oxycodone.  The patient states the pain doctor that he is seeing does not prescribe oxycodone and only deals with morphine.  The patient would like to continue his pain control with oxycodone instead of morphine.  The patient is also willing to move forward with a left first toe/ray amputation.  The patient feels that his discomfort has progressed to the point that he is unable to function on a daily basis and understands that there is little we are able to do endovascularly or surgically to restore blood flow to the area.  The patient's podiatrist is Dr. Al Corpus.   1. PVD (peripheral vascular disease) (HCC) - Stable Augmentin 875/125 mg 1 tab every 12 hours #20 prescribed  The patient would like  to move forward with an amputation of the left first big toe.  The patient's podiatrist is Dr. Al Corpus. I will talk to Dr. [name] about moving forward with this. The patient understands if he should experience any worsening redness/drainage to the toe, fever, nausea vomiting that he must seek medical attention in the emergency department The patient and his wife who was present expresses her understanding  2. Chronic pain syndrome - Stable The patient does not want to take the morphine 15mg  and would like to be placed back on the oxycodone 20mg  for pain control The patient had both bottles of pills with him during this visit I asked him to please call the pain clinic tomorrow to find out the appropriate way of disposing of the morphine as we would not be able to prescribe any new pain medication as a pharmacy would not fill it The patient does not want to continue his pain control with the current pain clinic as the doctor only prescribes morphine and the patient does not feel that this adequately controls his pain. We will also discuss this with Dr. [name]  Current Outpatient Medications on File Prior to Visit  Medication Sig Dispense Refill  . amoxicillin-clavulanate (AUGMENTIN) 875-125 MG tablet Take 1 tablet by mouth every 12 (twelve) hours. 20 tablet 0  . aspirin EC 81 MG tablet Take 1 tablet (81 mg total) by mouth daily. 150 tablet 2  . atorvastatin (LIPITOR) 40 MG tablet Take 40 mg by mouth daily.     . clopidogrel (PLAVIX) 75 MG tablet Take 1 tablet (75 mg total) by mouth daily. 30 tablet 8  . escitalopram (LEXAPRO) 20 MG tablet Take 20 mg by mouth daily.   5  . lidocaine-prilocaine (EMLA) cream APPLY TO LEFT GREAT TOE DAILY AS NEEDED  2  . morphine (MSIR) 15 MG tablet Take by mouth.    . Oxycodone HCl 20 MG TABS Take 1 tablet (20 mg total) by mouth every 8 (eight) hours as needed. 60 tablet 0  . varenicline (CHANTIX CONTINUING MONTH PAK) 1 MG tablet Take 1 tablet (1 mg total) 2 (two)  times daily by mouth. Begin this Rx when the first week of 0.5 mg tabs is completed 60 tablet 1  . varenicline (CHANTIX) 0.5 MG tablet Take 1 tablet (0.5 mg total) 2 (two) times daily  by mouth. Start by taking 0.5 mg daily on days 1-3 then increase to 0.5 mg bid days 4-7 11 tablet 0  . Vitamin D, Ergocalciferol, (DRISDOL) 50000 units CAPS capsule Take 50,000 Units by mouth every Wednesday.      No current facility-administered medications on file prior to visit.    There are no Patient Instructions on file for this visit. No Follow-up on file.  Ayala Ribble A Maybell Misenheimer, PA-C

## 2017-11-26 ENCOUNTER — Telehealth (INDEPENDENT_AMBULATORY_CARE_PROVIDER_SITE_OTHER): Payer: Self-pay | Admitting: Vascular Surgery

## 2017-11-26 NOTE — Telephone Encounter (Signed)
WIFE CALLED BECAUSE SHE WAS SUPPOSE TO REPORT BACK TO [place] THAT SHE CALLED THE PAIN MGMT. SHE STATES TO REMIND KS THAT THIS WILL BE AN EARLY REFILL DUE TO THE SIGNIFICANT INFECTION THAT JOSH HAS. WAS TOLD THAT SHE WOULD BE ABLE TO COME PICK THE RX TODAY.

## 2017-11-26 NOTE — Telephone Encounter (Signed)
I was able to briefly speak with Dr. Gilda CreaseSchnier yesterday afternoon in regard to the patient not wanting to continue seeing his current pain management doctor.  I informed Dr. Gilda CreaseSchnier that the patient does not feel the morphine is controlling his pain and would like to continue with the oxycodone instead.  I have also informed Dr. Gilda CreaseSchnier that the patient would like to move forward with a toe amputation they previously discussed.  I will see Dr. Gilda CreaseSchnier tomorrow morning and will finalize plans on moving forward with pain control and amputation.  I am unsure as to what she means by an "early refill". The pain management physician / his pharmacy would be able to better answer if he is even able to refill any narcotic pain medicine.  I am assuming his pain medicine doctor would need to give permission as we are not a pain clinic.

## 2017-11-26 NOTE — Telephone Encounter (Signed)
Called the patient to let him know that the request for the pain medicine will have to be further discussed on tomorrow morning once Dr. Gilda CreaseSchnier is back in the office. He will determine if he is going to change the pain medicine from morphine to Oxycodone or if the pain clinic will have to make that change.

## 2017-11-27 ENCOUNTER — Other Ambulatory Visit (INDEPENDENT_AMBULATORY_CARE_PROVIDER_SITE_OTHER): Payer: Self-pay | Admitting: Vascular Surgery

## 2017-11-27 DIAGNOSIS — F17200 Nicotine dependence, unspecified, uncomplicated: Secondary | ICD-10-CM

## 2017-11-27 DIAGNOSIS — I739 Peripheral vascular disease, unspecified: Secondary | ICD-10-CM

## 2017-11-27 NOTE — Telephone Encounter (Signed)
I informed the patient that I spoke with Dr. Gilda CreaseSchnier in regard to his chronic pain issues and possible amputation of the left first toe.  Our office will be unable to continue providing narcotic pain medication on a regular basis for the patient's chronic pain needs.  I explained that morphine is used to treat cancer leg pain and should be strong enough to provide him symptomatic relief due to his peripheral artery disease pain.  I encouraged him to speak to his pain doctor about possibly changing his dosage.  The patient states that he was released from his pain clinic yesterday due to his "upcoming surgery".  No date has been set for his surgery so I am uncertain as to why the patient chose to release himself from pain management.  I have sent a STAT referral to Dr. Al CorpusHyatt his podiatrist as the patient would like to move forward with the amputation of his first left toe.  The patient states that whoever can do it "sooner" he will move forward with.  The patient would like to follow-up with Dr. Gilda CreaseSchnier.  I transferred him to the scheduling department to make an appointment.

## 2017-11-28 ENCOUNTER — Telehealth (INDEPENDENT_AMBULATORY_CARE_PROVIDER_SITE_OTHER): Payer: Self-pay | Admitting: Vascular Surgery

## 2017-11-28 NOTE — Telephone Encounter (Signed)
Our office can not continue to prescribe oxycodone for Harold [name]'s chronic pain needs. He needs to re-connect with his pain doctor to discuss how to provide him with pain relief.  An urgent referral was placed to Harold [name] for amputation of his left first toe.  An appointment was made as per Harold [name]'s request to see Harold [name] sooner.

## 2017-11-28 NOTE — Telephone Encounter (Signed)
Called the patient's wife back to let her know that Josh asked Selena BattenKim to move the appointment up with Dr. Gilda CreaseSchnier and his appointment with Dr. Al CorpusHyatt for the amputation of left great toe has been moved up as well.

## 2017-12-01 ENCOUNTER — Ambulatory Visit (INDEPENDENT_AMBULATORY_CARE_PROVIDER_SITE_OTHER): Payer: Managed Care, Other (non HMO) | Admitting: Podiatry

## 2017-12-01 ENCOUNTER — Telehealth: Payer: Self-pay | Admitting: Podiatry

## 2017-12-01 ENCOUNTER — Ambulatory Visit (INDEPENDENT_AMBULATORY_CARE_PROVIDER_SITE_OTHER): Payer: Managed Care, Other (non HMO)

## 2017-12-01 ENCOUNTER — Encounter: Payer: Self-pay | Admitting: Podiatry

## 2017-12-01 VITALS — BP 130/74 | HR 60

## 2017-12-01 DIAGNOSIS — I739 Peripheral vascular disease, unspecified: Secondary | ICD-10-CM

## 2017-12-01 DIAGNOSIS — L03119 Cellulitis of unspecified part of limb: Secondary | ICD-10-CM

## 2017-12-01 DIAGNOSIS — L02619 Cutaneous abscess of unspecified foot: Secondary | ICD-10-CM

## 2017-12-01 DIAGNOSIS — L02612 Cutaneous abscess of left foot: Secondary | ICD-10-CM | POA: Diagnosis not present

## 2017-12-01 MED ORDER — OXYCODONE HCL 10 MG PO TABS: 10.0000 mg | ORAL_TABLET | Freq: Three times a day (TID) | ORAL | 0 refills | Status: DC | PRN

## 2017-12-01 MED ORDER — OXYCODONE-ACETAMINOPHEN 10-325 MG PO TABS: 1.0000 | ORAL_TABLET | Freq: Three times a day (TID) | ORAL | 0 refills | Status: DC | PRN

## 2017-12-01 NOTE — Telephone Encounter (Signed)
I informed Cortney, [facility] had [facility] and informed that Dr. Logan BoresEVans had discontinued the morphine and pt was taking the oxycodone.

## 2017-12-01 NOTE — Patient Instructions (Signed)
Pre-Operative Instructions  Congratulations, you have decided to take an important step towards improving your quality of life.  You can be assured that the doctors and staff at [facility] will be with you every step of the way.  Here are some important things you should know:  1. Plan to be at the surgery center/hospital at least 1 (one) hour prior to your scheduled time, unless otherwise directed by the surgical center/hospital staff.  You must have a responsible adult accompany you, remain during the surgery and drive you home.  Make sure you have directions to the surgical center/hospital to ensure you arrive on time. 2. If you are having surgery at [facility] or [facility], you will need a copy of your medical history and physical form from your family physician within one month prior to the date of surgery. We will give you a form for your primary physician to complete.  3. We make every effort to accommodate the date you request for surgery.  However, there are times where surgery dates or times have to be moved.  We will contact you as soon as possible if a change in schedule is required.   4. No aspirin/ibuprofen for one week before surgery.  If you are on aspirin, any non-steroidal anti-inflammatory medications (Mobic, Aleve, Ibuprofen) should not be taken seven (7) days prior to your surgery.  You make take Tylenol for pain prior to surgery.  5. Medications - If you are taking daily heart and blood pressure medications, seizure, reflux, allergy, asthma, anxiety, pain or diabetes medications, make sure you notify the surgery center/hospital before the day of surgery so they can tell you which medications you should take or avoid the day of surgery. 6. No food or drink after midnight the night before surgery unless directed otherwise by surgical center/hospital staff. 7. No alcoholic beverages 24-hours prior to surgery.  No smoking 24-hours prior or 24-hours after  surgery. 8. Wear loose pants or shorts. They should be loose enough to fit over bandages, boots, and casts. 9. Don't wear slip-on shoes. Sneakers are preferred. 10. Bring your boot with you to the surgery center/hospital.  Also bring crutches or a walker if your physician has prescribed it for you.  If you do not have this equipment, it will be provided for you after surgery. 11. If you have not been contacted by the surgery center/hospital by the day before your surgery, call to confirm the date and time of your surgery. 12. Leave-time from work may vary depending on the type of surgery you have.  Appropriate arrangements should be made prior to surgery with your employer. 13. Prescriptions will be provided immediately following surgery by your doctor.  Fill these as soon as possible after surgery and take the medication as directed. Pain medications will not be refilled on weekends and must be approved by the doctor. 14. Remove nail polish on the operative foot and avoid getting pedicures prior to surgery. 15. Wash the night before surgery.  The night before surgery wash the foot and leg well with water and the antibacterial soap provided. Be sure to pay special attention to beneath the toenails and in between the toes.  Wash for at least three (3) minutes. Rinse thoroughly with water and dry well with a towel.  Perform this wash unless told not to do so by your physician.  Enclosed: 1 Ice pack (please put in freezer the night before surgery)   1 Hibiclens skin cleaner     Pre-op instructions  If you have any questions regarding the instructions, please do not hesitate to call our office.  [place]: [address], [place], [place] [address] -- [phone]  [place]: [address] [place], [place] [address] -- [phone]  [place]: [address]  [place], [place] [address] -- [phone]  [place]: [address], [address], [place], [place] [address] -- [phone]  Website:  [email or web address] 

## 2017-12-01 NOTE — Telephone Encounter (Signed)
Dr. Logan [facility]oresEvans [facility] to [facility]. I informed Nicholos JohnsKathleen - [facility] of [facility] Logan BoresEVans orders.

## 2017-12-01 NOTE — Telephone Encounter (Signed)
[facility] [place]. Calling about the Rx we just received for oxycodone 10 mg tablets. Calling to verify it is okay to refill this prescription today. We just have a prescription on file that was picked up on 30 January for morphine msir 15 mg tablets. We just need to document there was a change in therapy. If you could please give us a call back to advise we would greatly appreciate it. Our number is [phone]. Thanks and have a good day.

## 2017-12-01 NOTE — Telephone Encounter (Signed)
Pts wife called regarding the rx that was given to pt today at his appt.  I explained that the pharmacy had called and the nurse is waiting for Dr Logan BoresEvans to discuss and once that is done she will contact pharmacy.

## 2017-12-02 ENCOUNTER — Telehealth: Payer: Self-pay | Admitting: *Deleted

## 2017-12-02 NOTE — Progress Notes (Signed)
   HPI: 37 year old male presenting today as a new patient, referred by Dr. Gilda CreaseSchnier, a Vascular surgeon in [place], with a chief complaint of throbbing pain to the left great toe that began several months ago. He reports an associated wound to the area. He has not done anything to treat the symptoms. There are no modifying factors noted. Patient is here for further evaluation and treatment. Patient has a complicated past medical history regarding his vascular status.  He has had multiple vascular interventions and surgeries.  He was diagnosed with peripheral vascular disease probably 2 years ago.  The vascular surgeon recommended a below-knee amputation due to the fact that there is minimal blood flow past the level of the ankle joint.  The patient complains of pain localized to the great toe left foot.  He presents today for a second opinion regarding the level of the amputation that would be best beneficial for him   Past Medical History:  Diagnosis Date  . Anxiety   . Arthritis   . Back pain   . Peripheral vascular disease (HCC)   . Shingles      Physical Exam: General: The patient is alert and oriented x3 in no acute distress.  Dermatology: Skin is cool, dry and supple bilateral lower extremities.  There is an ischemic ulceration just underneath the nail plate of the left great toe.  Overlying eschar appears well adhered with minimal to no drainage.  Vascular: Critical limb ischemia left lower extremity  Neurological: Epicritic and protective threshold grossly intact bilaterally.   Musculoskeletal Exam: Range of motion within normal limits to all pedal and ankle joints bilateral. Muscle strength 5/5 in all groups bilateral.   Radiographic Exam:  Normal osseous mineralization. Joint spaces preserved. No fracture/dislocation/boney destruction.    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 X-rays reviewed.  - Vascular recommended  BKA. Patient would like to pursue a more distal amputation.  - Today we discussed the conservative versus surgical management of the presenting pathology. The patient opts for surgical management. All possible complications and details of the procedure were explained. All patient questions were answered. No guarantees were expressed or implied. - Authorization for surgery was initiated today. Surgery will consist of left great toe amputation.  - Prescription for Oxycodone 10 mg #60 provided to patient.  - Return to clinic 1 week post op.    Felecia ShellingBrent M. Evans, DPM [facility]  Dr. Felecia ShellingBrent M. Evans, DPM    [address]                                        [place], [place] [address]                Office [phone](336) [phone]  Fax [phone](336) [phone]

## 2017-12-02 NOTE — Telephone Encounter (Signed)
Harold [name] [facility] states [facility]'s insurance require PA for the amount he is receiving 20 days worth.

## 2017-12-02 NOTE — Telephone Encounter (Signed)
I spoke with Nicholos JohnsKathleen - [facility] and she states pt's wife states they run into that all the time and put the oxycodone on their discount card.

## 2017-12-04 ENCOUNTER — Ambulatory Visit (INDEPENDENT_AMBULATORY_CARE_PROVIDER_SITE_OTHER): Payer: Medicaid Other | Admitting: Vascular Surgery

## 2017-12-05 ENCOUNTER — Ambulatory Visit: Payer: Medicaid Other | Admitting: Podiatry

## 2017-12-11 ENCOUNTER — Encounter: Payer: Self-pay | Admitting: Podiatry

## 2017-12-11 DIAGNOSIS — M86672 Other chronic osteomyelitis, left ankle and foot: Secondary | ICD-10-CM

## 2017-12-17 ENCOUNTER — Ambulatory Visit (INDEPENDENT_AMBULATORY_CARE_PROVIDER_SITE_OTHER): Payer: Managed Care, Other (non HMO) | Admitting: Podiatry

## 2017-12-17 ENCOUNTER — Ambulatory Visit: Payer: Self-pay

## 2017-12-17 DIAGNOSIS — Z9889 Other specified postprocedural states: Secondary | ICD-10-CM

## 2017-12-17 MED ORDER — OXYCODONE HCL 10 MG PO TABS: 10.0000 mg | ORAL_TABLET | Freq: Three times a day (TID) | ORAL | 0 refills | Status: DC | PRN

## 2017-12-21 NOTE — Progress Notes (Signed)
   Subjective:  Patient presents today status post left great toe amputation. DOS: 12/11/17. He states he is feeling well overall. He states he is having difficulty dealing with the absence of the toe mentally, but otherwise has no complaints at this time. Patient is here for further evaluation and treatment.   Past Medical History:  Diagnosis Date  . Anxiety   . Arthritis   . Back pain   . Peripheral vascular disease (HCC)   . Shingles       Objective/Physical Exam Neurovascular status intact.  Skin incisions appear to be well coapted with sutures and staples intact. No sign of infectious process noted. No dehiscence. No active bleeding noted. Moderate edema noted to the surgical extremity.  Radiographic Exam:  Osteotomies sites appear to be stable with routine healing. Absence of left great toe noted.   Assessment: 1. s/p left great toe amputation. DOS: 12/11/17   Plan of Care:  1. Patient was evaluated. X-rays reviewed 2. Dressing changed. Keep clean, dry and intact for one week.  3. Weightbearing as tolerated in post op shoe.  4. Refill prescription for Oxycodone 10 mg #30 provided to patient.  5. Return to clinic in one week.    Felecia ShellingBrent M. Larkin Alfred, DPM [facility]  Dr. Felecia ShellingBrent M. Ayriana Wix, DPM    [address]                                        [place], [place] [address]                Office [phone](336) [phone]  Fax [phone](336) [phone]

## 2017-12-24 ENCOUNTER — Ambulatory Visit (INDEPENDENT_AMBULATORY_CARE_PROVIDER_SITE_OTHER): Payer: Managed Care, Other (non HMO) | Admitting: Podiatry

## 2017-12-24 ENCOUNTER — Telehealth: Payer: Self-pay | Admitting: *Deleted

## 2017-12-24 ENCOUNTER — Encounter: Payer: Self-pay | Admitting: Podiatry

## 2017-12-24 VITALS — BP 115/73 | HR 61 | Temp 97.2°F

## 2017-12-24 DIAGNOSIS — Z9889 Other specified postprocedural states: Secondary | ICD-10-CM

## 2017-12-24 MED ORDER — OXYCODONE HCL 10 MG PO TABS: 10.0000 mg | ORAL_TABLET | Freq: Three times a day (TID) | ORAL | 0 refills | Status: DC | PRN

## 2017-12-24 MED ORDER — SULFAMETHOXAZOLE-TRIMETHOPRIM 800-160 MG PO TABS: 1.0000 | ORAL_TABLET | Freq: Two times a day (BID) | ORAL | 0 refills | Status: DC

## 2017-12-24 NOTE — Telephone Encounter (Signed)
Harold [facility]Kathleen - [facility] states [facility] is calling for an early refill on the Oxycodone that was ordered 12/19/2017, due for refill 12/26/2017. I okayed the early refill this time, but instructed to tell pt this would be one time only and I would inform Harold [name] of the early refill and make a note.

## 2017-12-26 NOTE — Progress Notes (Signed)
DOS 12/11/17 Lt great toe amputation

## 2017-12-27 NOTE — Progress Notes (Signed)
   Subjective:  Patient presents today status post left great toe amputation. DOS: 12/11/17. He reports yellow drainage from the incision site of the left great toe. He reports some improvement in the swelling of the left leg. He has been wearing the post op shoe as directed. Patient is here for further evaluation and treatment.   Past Medical History:  Diagnosis Date  . Anxiety   . Arthritis   . Back pain   . Peripheral vascular disease (HCC)   . Shingles       Objective/Physical Exam Neurovascular status intact.  Skin incisions appear to be well coapted with sutures and staples intact. Erythema and edema localized to the left forefoot. No dehiscence. No active bleeding noted.   Assessment: 1. s/p left great toe amputation. DOS: 12/11/17 2. Post op cellulitis left forefoot  Plan of Care:  1. Patient was evaluated.  2. Prescription for Bactrim DS provided to patient.  3. Dry sterile dressing applied. Keep clean, dry and intact.  4. Continue weightbearing in post op shoe.  5. Refill prescription for Oxycodone 10 mg #30 provided to patient.  6. Return to clinic in 1 week.   Felecia ShellingBrent M. Dolores Mcgovern, DPM [facility]  Dr. Felecia ShellingBrent M. Clytie Shetley, DPM    [address]                                        [place], [place] [address]                Office [phone]([phone]  Fax [phone]([phone]

## 2017-12-31 ENCOUNTER — Telehealth: Payer: Self-pay | Admitting: *Deleted

## 2017-12-31 ENCOUNTER — Ambulatory Visit (INDEPENDENT_AMBULATORY_CARE_PROVIDER_SITE_OTHER): Payer: Managed Care, Other (non HMO) | Admitting: Podiatry

## 2017-12-31 ENCOUNTER — Encounter: Payer: Self-pay | Admitting: Podiatry

## 2017-12-31 ENCOUNTER — Ambulatory Visit (INDEPENDENT_AMBULATORY_CARE_PROVIDER_SITE_OTHER): Payer: Managed Care, Other (non HMO)

## 2017-12-31 DIAGNOSIS — Z89422 Acquired absence of other left toe(s): Secondary | ICD-10-CM

## 2017-12-31 DIAGNOSIS — Z9889 Other specified postprocedural states: Secondary | ICD-10-CM

## 2017-12-31 DIAGNOSIS — M86672 Other chronic osteomyelitis, left ankle and foot: Secondary | ICD-10-CM | POA: Diagnosis not present

## 2017-12-31 DIAGNOSIS — IMO0002 Reserved for concepts with insufficient information to code with codable children: Secondary | ICD-10-CM

## 2017-12-31 MED ORDER — GENTAMICIN SULFATE 0.1 % EX CREA: 1.0000 "application " | TOPICAL_CREAM | Freq: Three times a day (TID) | CUTANEOUS | 1 refills | Status: DC

## 2017-12-31 NOTE — Telephone Encounter (Signed)
Emailed order to A. Baron Hamperrotter - [facility].

## 2017-12-31 NOTE — Telephone Encounter (Signed)
-----   Message from Felecia ShellingBrent M Evans, DPM sent at 12/31/2017  2:31 PM EDT ----- Regarding: Knee scooter authorization Can we arrange authorization for a knee scooter for the patient as soon as possible.   Thanks, Dr. Logan BoresEvans  Dx: Status post great toe amputation left foot.  Nonweightbearing left lower extremity x 6 weeks.

## 2018-01-01 DIAGNOSIS — F32A Depression, unspecified: Secondary | ICD-10-CM | POA: Insufficient documentation

## 2018-01-01 DIAGNOSIS — F1123 Opioid dependence with withdrawal: Secondary | ICD-10-CM | POA: Insufficient documentation

## 2018-01-01 DIAGNOSIS — Z79891 Long term (current) use of opiate analgesic: Secondary | ICD-10-CM | POA: Insufficient documentation

## 2018-01-01 DIAGNOSIS — F11288 Opioid dependence with other opioid-induced disorder: Secondary | ICD-10-CM | POA: Insufficient documentation

## 2018-01-01 NOTE — Progress Notes (Signed)
   Subjective:  Patient presents today status post left great toe amputation. DOS: 12/11/17. He states the foot looks improved. He reports he is still taking the Bactrim as directed. He denies any new complaints at this time. Patient is here for further evaluation and treatment.   Past Medical History:  Diagnosis Date  . Anxiety   . Arthritis   . Back pain   . Peripheral vascular disease (HCC)   . Shingles       Objective/Physical Exam Neurovascular status intact.  Skin incisions appear to be well coapted with sutures and staples intact. Erythema and edema localized to the left forefoot. No dehiscence. No active bleeding noted.   Assessment: 1. s/p left great toe amputation. DOS: 12/11/17 2. Post op cellulitis left forefoot  Plan of Care:  1. Patient was evaluated.  2. Continue weightbearing in post op shoe.  3. Authorization for knee scooter for work placed today.  4. Return to clinic in 1 week for dressing change with Shanda BumpsJessica, RN.  5. Note for work provided to return on 01/08/18.  6. Return to clinic in 2 weeks for follow up with me.   Felecia ShellingBrent M. Evans, DPM [facility]  Dr. Felecia ShellingBrent M. Evans, DPM    [address]                                        [place], [place] [address]                Office [phone](336) [phone]  Fax [phone](336) [phone]

## 2018-01-01 NOTE — Telephone Encounter (Signed)
Emailed orders to [facility] for rolling walker.

## 2018-01-01 NOTE — Addendum Note (Signed)
Addended by: Alphia Kava'CONNELL, VALERY D on: 01/01/2018 11:53 AM   Modules accepted: Orders

## 2018-01-07 ENCOUNTER — Encounter: Payer: Self-pay | Admitting: Podiatry

## 2018-01-07 ENCOUNTER — Other Ambulatory Visit: Payer: Managed Care, Other (non HMO)

## 2018-01-12 ENCOUNTER — Ambulatory Visit (INDEPENDENT_AMBULATORY_CARE_PROVIDER_SITE_OTHER): Payer: Managed Care, Other (non HMO) | Admitting: Podiatry

## 2018-01-12 ENCOUNTER — Encounter: Payer: Self-pay | Admitting: Podiatry

## 2018-01-12 DIAGNOSIS — Z89422 Acquired absence of other left toe(s): Secondary | ICD-10-CM

## 2018-01-12 DIAGNOSIS — IMO0002 Reserved for concepts with insufficient information to code with codable children: Secondary | ICD-10-CM

## 2018-01-12 DIAGNOSIS — S98112A Complete traumatic amputation of left great toe, initial encounter: Secondary | ICD-10-CM | POA: Insufficient documentation

## 2018-01-12 DIAGNOSIS — Z9889 Other specified postprocedural states: Secondary | ICD-10-CM

## 2018-01-15 NOTE — Progress Notes (Signed)
   Subjective:  Patient presents today status post left great toe amputation. DOS: 12/11/17. He states he is doing well and the amputation site has improved significantly. He denies any pain or any new complaints at this time. He is ready to resume working soon. Patient is here for further evaluation and treatment.   Past Medical History:  Diagnosis Date  . Anxiety   . Arthritis   . Back pain   . Peripheral vascular disease (HCC)   . Shingles       Objective/Physical Exam Neurovascular status intact.  Skin incisions appear to be well coapted with sutures and staples intact. Erythema and edema localized to the left forefoot. No dehiscence. No active bleeding noted.   Assessment: 1. s/p left great toe amputation. DOS: 12/11/17 2. Post op cellulitis left forefoot  Plan of Care:  1. Patient was evaluated.  2. Sutures removed today. Dry sterile dressing applied.  3. Continue wearing post op shoe.  4. Recommended gentamicin cream daily with a large Band-Aid.  5. Return to clinic in 2 weeks.    Felecia ShellingBrent M. Kalene Cutler, DPM [facility]  Dr. Felecia ShellingBrent M. Mckinze Poirier, DPM    [address]                                        [place], [place] [address]                Office [phone](336) [phone]  Fax [phone]([phone]

## 2018-01-26 ENCOUNTER — Encounter: Payer: Self-pay | Admitting: Podiatry

## 2018-01-26 ENCOUNTER — Ambulatory Visit (INDEPENDENT_AMBULATORY_CARE_PROVIDER_SITE_OTHER): Payer: Managed Care, Other (non HMO) | Admitting: Podiatry

## 2018-01-26 DIAGNOSIS — Z9889 Other specified postprocedural states: Secondary | ICD-10-CM

## 2018-01-26 DIAGNOSIS — IMO0002 Reserved for concepts with insufficient information to code with codable children: Secondary | ICD-10-CM

## 2018-01-28 NOTE — Progress Notes (Signed)
   Subjective:  Patient presents today status post left great toe amputation. DOS: 12/11/17. He states the incision looks well. He has a new complaint of a lesion to the lateral aspect of the fourth toe of the left foot that appeared two weeks ago. He has not done anything to treat the area. There are no modifying factors noted. Patient is here for further evaluation and treatment.   Past Medical History:  Diagnosis Date  . Anxiety   . Arthritis   . Back pain   . Peripheral vascular disease (HCC)   . Shingles       Objective/Physical Exam Neurovascular status intact.  Skin incisions appear to be well coapted. No sign of infectious process noted. No dehiscence. No active bleeding noted. Moderate edema noted to the surgical extremity.  Assessment: 1. s/p left great toe amputation. DOS: 12/11/17 2. Post op cellulitis left forefoot - resolved  Plan of Care:  1. Patient was evaluated.  2. Incision completely healed.  3. Resume wearing normal shoe gear. 4. Return to work and full activity with no restrictions.  5. Return to clinic in one month for last follow up appointment.     Felecia ShellingBrent M. Evans, DPM [facility]  Dr. Felecia ShellingBrent M. Evans, DPM    [address]                                        [place], [place] [address]                Office [phone]([phone]  Fax [phone]([phone]

## 2018-02-10 ENCOUNTER — Other Ambulatory Visit (INDEPENDENT_AMBULATORY_CARE_PROVIDER_SITE_OTHER): Payer: Self-pay

## 2018-02-10 ENCOUNTER — Telehealth (INDEPENDENT_AMBULATORY_CARE_PROVIDER_SITE_OTHER): Payer: Self-pay | Admitting: Vascular Surgery

## 2018-02-10 MED ORDER — CLOPIDOGREL BISULFATE 75 MG PO TABS: 75.0000 mg | ORAL_TABLET | Freq: Every day | ORAL | 8 refills | Status: DC

## 2018-02-10 NOTE — Telephone Encounter (Signed)
needs [facility]ill on [facility] blood thinner [facility] [place]

## 2018-02-10 NOTE — Telephone Encounter (Signed)
[facility] [place].

## 2018-02-25 ENCOUNTER — Encounter: Payer: Managed Care, Other (non HMO) | Admitting: Podiatry

## 2018-03-11 ENCOUNTER — Ambulatory Visit (INDEPENDENT_AMBULATORY_CARE_PROVIDER_SITE_OTHER): Payer: Managed Care, Other (non HMO) | Admitting: Podiatry

## 2018-03-11 ENCOUNTER — Ambulatory Visit (INDEPENDENT_AMBULATORY_CARE_PROVIDER_SITE_OTHER): Payer: Managed Care, Other (non HMO)

## 2018-03-11 DIAGNOSIS — IMO0002 Reserved for concepts with insufficient information to code with codable children: Secondary | ICD-10-CM

## 2018-03-11 DIAGNOSIS — L84 Corns and callosities: Secondary | ICD-10-CM

## 2018-03-11 DIAGNOSIS — M86672 Other chronic osteomyelitis, left ankle and foot: Secondary | ICD-10-CM | POA: Diagnosis not present

## 2018-03-14 NOTE — Progress Notes (Signed)
   Subjective:  Patient presents today status post left great toe amputation. DOS: 12/11/17. He states he is doing very well regarding his surgery. He reports a new complaint of a corn between the 4th and 5th toes of the left foot. He has not done anything for treatment. There are no modifying factors noted. Patient is here for further evaluation and treatment.   Past Medical History:  Diagnosis Date  . Anxiety   . Arthritis   . Back pain   . Peripheral vascular disease (HCC)   . Shingles       Objective/Physical Exam Neurovascular status intact.  Skin incisions appear to be well coapted and healed. No sign of infectious process noted. No dehiscence. No active bleeding noted. Moderate edema noted to the surgical extremity. Hyperkeratotic tissue noted to the 4th interdigital webspace of the left foot.   Radiographic Exam:  Orthopedic hardware and osteotomies sites appear to be stable with routine healing.   Assessment: 1. s/p left great toe amputation. DOS: 12/11/17 2. Heloma molle left foot  Plan of Care:  1. Patient was evaluated. X-Rays reviewed.  2. Recommended Iodine and Band-Aid over heloma molle.  3. Silicone toe spacers dispensed.  4. May resume full activity with no restrictions.  5. Return to clinic as needed.    Felecia Shelling, DPM [facility]  Dr. Felecia Shelling, DPM    [address]                                        [place], [place] [address]                Office [phone]  Fax [phone]

## 2018-05-04 ENCOUNTER — Telehealth (INDEPENDENT_AMBULATORY_CARE_PROVIDER_SITE_OTHER): Payer: Self-pay | Admitting: Vascular Surgery

## 2018-05-04 NOTE — Telephone Encounter (Signed)
WOULD LIKE A REFERRAL TO A VASCULAR SGY IN [place] FOR CONVIENCE TO HIS JOB. HE W[facility] FAX NUMBER [phone] [phone]. IF THIS IS OK JUST RESPOND TO ME AND I WILL ENTER IT.

## 2018-05-04 NOTE — Telephone Encounter (Signed)
That will be fine to send his referral to [facility].

## 2018-05-06 NOTE — Telephone Encounter (Signed)
FAXED 05/06/18 @ 10:51AM

## 2021-05-29 ENCOUNTER — Other Ambulatory Visit: Payer: Self-pay

## 2021-05-29 ENCOUNTER — Ambulatory Visit (INDEPENDENT_AMBULATORY_CARE_PROVIDER_SITE_OTHER): Payer: BC Managed Care – PPO

## 2021-05-29 ENCOUNTER — Encounter: Payer: Self-pay | Admitting: Podiatry

## 2021-05-29 ENCOUNTER — Ambulatory Visit: Payer: BC Managed Care – PPO | Admitting: Podiatry

## 2021-05-29 DIAGNOSIS — L97522 Non-pressure chronic ulcer of other part of left foot with fat layer exposed: Secondary | ICD-10-CM

## 2021-05-29 DIAGNOSIS — I70222 Atherosclerosis of native arteries of extremities with rest pain, left leg: Secondary | ICD-10-CM | POA: Diagnosis not present

## 2021-05-29 DIAGNOSIS — I739 Peripheral vascular disease, unspecified: Secondary | ICD-10-CM

## 2021-05-29 NOTE — Progress Notes (Signed)
   HPI: 40 y.o. male PSxHx left great toe amputation and PMHx PVD presenting today for evaluation of pain and tenderness to the left fourth toe.  Patient was last seen in the office 03/11/2018.  He does have an established history with Dr. [name], [place] who performed his revascularization procedures in 2019 at the same time as the left great toe amputation.  Patient states that over the past several weeks he is slowly increased pain and tenderness to the left fourth toe.  He believes there is an ulcer that is developing.  He presents for further treatment and evaluation  Past Medical History:  Diagnosis Date   Anxiety    Arthritis    Back pain    Peripheral vascular disease (HCC)    Shingles      Physical Exam: General: The patient is alert and oriented x3 in no acute distress.  Dermatology: Superficial skin fissure noted along the plantar sulcus of the left fourth toe.  There is also a small interdigital wound noted to the lateral aspect of the left fourth toe adjacent the fifth toe.  Periwound is macerated.  There does not appear to be any exposed bone muscle tendon ligament or joint or probing of the skin fissures and superficial ulcer.  The ulcer measures approximately 0.2 x 0.2 x 0.1.  The skin fissure measures approximately 0.5 x 0.1 x 0.1.  Vascular: Skin is cool to touch.  Venous reflux and erythema noted to the foot in general.  Neurological: Epicritic and protective threshold grossly intact bilaterally.   Musculoskeletal Exam: History of left great toe amputation.  Radiographic Exam:  Normal osseous mineralization. Joint spaces preserved. No fracture/dislocation/boney destruction.  Absence of left great toe at the level of the MTPJ with stable healing  Assessment: 1.  Known history of PVD LLE 2.  History of left great toe amputation secondary to PVD 3.  Ulcer and superficial skin fissure left fourth toe   Plan of Care:  1. Patient evaluated. X-Rays  reviewed.  2.  Robyne Askew paint was provided for the patient to apply daily to the interdigital areas of the skin fissure and superficial ulcer 3.  Recommend wide fitting shoes that do not constrict the toebox area 4.  Referral placed for follow-up evaluation with vascular 5.  Note for work was provided today to allow the patient to use a knee scooter as needed 6.  Return to clinic in 4 weeks      Felecia Shelling, DPM [facility]  Dr. Felecia Shelling, DPM    [address] [place], [place] [address]                Office [phone]  Fax [phone]

## 2021-05-30 ENCOUNTER — Other Ambulatory Visit (INDEPENDENT_AMBULATORY_CARE_PROVIDER_SITE_OTHER): Payer: Self-pay | Admitting: Vascular Surgery

## 2021-05-30 DIAGNOSIS — I70219 Atherosclerosis of native arteries of extremities with intermittent claudication, unspecified extremity: Secondary | ICD-10-CM | POA: Insufficient documentation

## 2021-05-30 DIAGNOSIS — Z9862 Peripheral vascular angioplasty status: Secondary | ICD-10-CM

## 2021-05-30 DIAGNOSIS — I739 Peripheral vascular disease, unspecified: Secondary | ICD-10-CM

## 2021-05-30 NOTE — Progress Notes (Signed)
MRN : [id number]  Harold [name] is a 40 y.o. (09/17/1981) male who presents with chief complaint of leg pain.  History of Present Illness:    The patient is seen for evaluation of painful lower extremities and diminished pulses.  He has been followed here at [facility] in the past.  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    Patient notes the pain is always associated with activity and is very consistent day today. Typically, the pain occurs at less than one block, progress is as activity continues to the point that the patient must stop walking. Resting including standing still for several minutes allowed resumption of the activity and the ability to walk a similar distance before stopping again.   There is a small area or corn that is quite tender to the touch associated with the third toe.  He has tried using silicone toe spacers but found them extremely uncomfortable.  The patient denies rest pain or dangling of an extremity off the side of the bed during the night for relief. No prior interventions or surgeries.  No history of back problems or DJD of the lumbar sacral spine.   The patient denies changes in claudication symptoms or new rest pain symptoms.  No new ulcers or wounds of the foot.  The patient's blood pressure has been stable and relatively well controlled. The patient denies amaurosis fugax or recent TIA symptoms. There are no recent neurological changes noted. The patient denies history of DVT, PE or superficial thrombophlebitis. The patient denies recent episodes of angina or shortness of breath.    No outpatient medications have been marked as taking for the 05/31/21 encounter (Appointment) with [name], [name], MD.    Past Medical History:  Diagnosis Date   Anxiety    Arthritis    Back pain    Peripheral vascular disease (HCC)    Shingles     Past Surgical History:  Procedure Laterality Date    BYPASS GRAFT POPLITEAL TO POPLITEAL Left 09/20/2015   Procedure: BYPASS GRAFT POPLITEAL TO PERONEAL;  Surgeon: Renford Dills, MD;  Location: [facility];  Service: Vascular;  Laterality: Left;   EMBOLECTOMY Left 09/20/2015   Procedure: EMBOLECTOMY;  Surgeon: Renford Dills, MD;  Location: [facility];  Service: Vascular;  Laterality: Left;   LOWER EXTREMITY ANGIOGRAPHY Left 07/22/2017   Procedure: Lower Extremity Angiography;  Surgeon: Renford Dills, MD;  Location: [facility];  Service: Cardiovascular;  Laterality: Left;   LOWER EXTREMITY INTERVENTION  07/22/2017   Procedure: LOWER EXTREMITY INTERVENTION;  Surgeon: Renford Dills, MD;  Location: [facility];  Service: Cardiovascular;;   LUMBAR LAMINECTOMY/DECOMPRESSION MICRODISCECTOMY Right 12/29/2012   Procedure: SPINE: LAMINOTOMY DECOMPRESSION NERVE ROOT EXCISION HERNIATED DISK 1 SPACE LUMBAR;  Surgeon: Mat Carne, MD;  Location: [facility];  Service: Orthopedics;  Laterality: Right;  RIGHT L5-S1 DISC EXCISION   ORIF ANKLE FRACTURE Left 09/20/2015   Procedure: OPEN REDUCTION INTERNAL FIXATION (ORIF) ANKLE FRACTURE;  Surgeon: Kennedy Bucker, MD;  Location: [facility];  Service: Orthopedics;  Laterality: Left;   PERIPHERAL VASCULAR CATHETERIZATION Left 03/21/2015   Procedure: Lower Extremity Angiography;  Surgeon: Renford Dills, MD;  Location: [facility];  Service: Cardiovascular;  Laterality: Left;   PERIPHERAL VASCULAR CATHETERIZATION  03/21/2015   Procedure: Lower Extremity Intervention;  Surgeon: Renford Dills, MD;  Location: [facility];  Service: Cardiovascular;;   PERIPHERAL  VASCULAR CATHETERIZATION Left 09/05/2015   Procedure: Lower Extremity Angiography;  Surgeon: Renford Dills, MD;  Location: [facility];  Service: Cardiovascular;  Laterality: Left;   PERIPHERAL VASCULAR CATHETERIZATION  09/05/2015   Procedure: Lower Extremity Intervention;  Surgeon: Renford Dills, MD;   Location: [facility];  Service: Cardiovascular;;   PERIPHERAL VASCULAR CATHETERIZATION Left 08/27/2016   Procedure: Lower Extremity Angiography;  Surgeon: Renford Dills, MD;  Location: [facility];  Service: Cardiovascular;  Laterality: Left;   TOOTH EXTRACTION     WISDOM TOOTH EXTRACTION      Social History Social History   Tobacco Use   Smoking status: Some Days    Packs/day: 0.50    Years: 16.00    Pack years: 8.00    Types: Cigarettes   Smokeless tobacco: Never  Vaping Use   Vaping Use: Never used  Substance Use Topics   Alcohol use: No    Alcohol/week: 0.0 standard drinks    Comment: rare   Drug use: No    Family History Family History  Problem Relation Age of Onset   Cancer Mother    Diabetes Mother    Hypertension Mother    Hyperlipidemia Mother    Heart attack Father    Cancer Father     No Known Allergies   REVIEW OF SYSTEMS (Negative unless checked)  Constitutional: [] Weight loss  [] Fever  [] Chills Cardiac: [] Chest pain   [] Chest pressure   [] Palpitations   [] Shortness of breath when laying flat   [] Shortness of breath with exertion. Vascular:  [x] Pain in legs with walking   [x] Pain in legs at rest  [] History of DVT   [] Phlebitis   [] Swelling in legs   [] Varicose veins   [] Non-healing ulcers Pulmonary:   [] Uses home oxygen   [] Productive cough   [] Hemoptysis   [] Wheeze  [] COPD   [] Asthma Neurologic:  [] Dizziness   [] Seizures   [] History of stroke   [] History of TIA  [] Aphasia   [] Vissual changes   [] Weakness or numbness in arm   [] Weakness or numbness in leg Musculoskeletal:   [] Joint swelling   [] Joint pain   [] Low back pain Hematologic:  [] Easy bruising  [] Easy bleeding   [] Hypercoagulable state   [] Anemic Gastrointestinal:  [] Diarrhea   [] Vomiting  [] Gastroesophageal reflux/heartburn   [] Difficulty swallowing. Genitourinary:  [] Chronic kidney disease   [] Difficult urination  [] Frequent urination   [] Blood in urine Skin:  [] Rashes    [] Ulcers  Psychological:  [] History of anxiety   []  History of major depression.  Physical Examination  There were no vitals filed for this visit. There is no height or weight on file to calculate BMI. Gen: WD/WN, NAD Head: [place]/AT, No temporalis wasting.  Ear/Nose/Throat: Hearing grossly intact, nares w/o erythema or drainage Eyes: PER, EOMI, sclera nonicteric.  Neck: Supple, no masses.  No bruit or JVD.  Pulmonary:  Good air movement, no audible wheezing, no use of accessory muscles.  Cardiac: RRR, normal S1, S2, no Murmurs. Vascular:     Vessel Right Left  Radial Palpable Palpable  PT Palpable Not Palpable  DP Palpable Not Palpable  Gastrointestinal: soft, non-distended. No guarding/no peritoneal signs.  Musculoskeletal: M/S 5/5 throughout.  No visible deformity.  Neurologic: CN 2-12 intact. Pain and light touch intact in extremities.  Symmetrical.  Speech is fluent. Motor exam as listed above. Psychiatric: Judgment intact, Mood & affect appropriate for pt's clinical situation. Dermatologic: No rashes or ulcers noted.  No changes consistent  with cellulitis.   CBC Lab Results  Component Value Date   WBC 12.2 (H) 09/21/2015   HGB 11.9 (L) 09/21/2015   HCT 36.2 (L) 09/21/2015   MCV 89.3 09/21/2015   PLT 241 09/21/2015    BMET    Component Value Date/Time   NA 140 09/21/2015 0801   K 4.4 09/21/2015 0801   CL 104 09/21/2015 0801   CO2 30 09/21/2015 0801   GLUCOSE 108 (H) 09/21/2015 0801   BUN 19 07/21/2017 0809   CREATININE 0.99 07/21/2017 0809   CREATININE 1.03 09/18/2015 1601   CALCIUM 8.5 (L) 09/21/2015 0801   GFRNONAA >60 07/21/2017 0809   GFRAA >60 07/21/2017 0809   CrCl cannot be calculated (Patient's most recent lab result is older than the maximum 21 days allowed.).  COAG Lab Results  Component Value Date   INR 0.95 09/18/2015   INR 1.01 12/28/2012    Radiology DG Foot Complete Right  Result Date: 05/29/2021 Please see detailed radiograph report in  office note.    Assessment/Plan 1. Thromboangiitis obliterans (Buerger's disease) (HCC)  Recommend:  The patient has evidence of atherosclerosis of the lower extremities with claudication.  The patient does not voice lifestyle limiting changes at this point in time.  With respect to the irritation between his toes since the silicon spacers did not work I have recommended he try small tufts of lambswool placed between the toes to give some cushioning and some separation  Noninvasive studies do not suggest clinically significant change his study is pretty much the same as it was a couple years ago.  No invasive studies, angiography or surgery at this time The patient should continue walking and begin a more formal exercise program.  The patient should continue antiplatelet therapy and aggressive treatment of the lipid abnormalities  No changes in the patient's medications at this time he had significant side effects from cilostazol and did not find it to be particularly helpful or improve his lower extremity symptoms.      2. Atherosclerosis of native artery of both lower extremities with intermittent claudication (HCC) Recommend:  The patient has evidence of atherosclerosis of the lower extremities with claudication.  The patient does not voice lifestyle limiting changes at this point in time.  With respect to the irritation between his toes since the silicon spacers did not work I have recommended he try small tufts of lambswool placed between the toes to give some cushioning and some separation  Noninvasive studies do not suggest clinically significant change his study is pretty much the same as it was a couple years ago.  No invasive studies, angiography or surgery at this time The patient should continue walking and begin a more formal exercise program.  The patient should continue antiplatelet therapy and aggressive treatment of the lipid abnormalities  No changes in the patient's  medications at this time he had significant side effects from cilostazol and did not find it to be particularly helpful or improve his lower extremity symptoms.  3. Pure hypercholesterolemia Continue statin as ordered and reviewed, no changes at this time     Levora Dredge, MD  05/30/2021 1:08 PM

## 2021-05-31 ENCOUNTER — Ambulatory Visit (INDEPENDENT_AMBULATORY_CARE_PROVIDER_SITE_OTHER): Payer: BC Managed Care – PPO

## 2021-05-31 ENCOUNTER — Ambulatory Visit (INDEPENDENT_AMBULATORY_CARE_PROVIDER_SITE_OTHER): Payer: BC Managed Care – PPO | Admitting: Vascular Surgery

## 2021-05-31 ENCOUNTER — Other Ambulatory Visit: Payer: Self-pay

## 2021-05-31 ENCOUNTER — Encounter (INDEPENDENT_AMBULATORY_CARE_PROVIDER_SITE_OTHER): Payer: Self-pay | Admitting: Vascular Surgery

## 2021-05-31 VITALS — BP 150/95 | HR 75 | Resp 16 | Ht 72.0 in | Wt 216.0 lb

## 2021-05-31 DIAGNOSIS — I731 Thromboangiitis obliterans [Buerger's disease]: Secondary | ICD-10-CM

## 2021-05-31 DIAGNOSIS — I70213 Atherosclerosis of native arteries of extremities with intermittent claudication, bilateral legs: Secondary | ICD-10-CM | POA: Diagnosis not present

## 2021-05-31 DIAGNOSIS — I739 Peripheral vascular disease, unspecified: Secondary | ICD-10-CM

## 2021-05-31 DIAGNOSIS — Z9862 Peripheral vascular angioplasty status: Secondary | ICD-10-CM

## 2021-05-31 DIAGNOSIS — E7800 Pure hypercholesterolemia, unspecified: Secondary | ICD-10-CM

## 2021-06-04 ENCOUNTER — Encounter (INDEPENDENT_AMBULATORY_CARE_PROVIDER_SITE_OTHER): Payer: Self-pay | Admitting: Vascular Surgery

## 2021-06-14 ENCOUNTER — Ambulatory Visit (INDEPENDENT_AMBULATORY_CARE_PROVIDER_SITE_OTHER): Payer: BC Managed Care – PPO | Admitting: Vascular Surgery

## 2021-06-26 ENCOUNTER — Ambulatory Visit: Payer: BC Managed Care – PPO | Admitting: Podiatry

## 2021-06-29 ENCOUNTER — Ambulatory Visit: Payer: BC Managed Care – PPO | Admitting: Podiatry

## 2021-07-02 ENCOUNTER — Ambulatory Visit (INDEPENDENT_AMBULATORY_CARE_PROVIDER_SITE_OTHER): Payer: BC Managed Care – PPO | Admitting: Vascular Surgery

## 2021-07-02 NOTE — Progress Notes (Deleted)
MRN : [id number]  Harold [name] is a 39 y.o. ([birth year]) male who presents with chief complaint of ***.  History of Present Illness: ***  No outpatient medications have been marked as taking for the 07/02/21 encounter (Appointment) with [name], [name], MD.    Past Medical History:  Diagnosis Date   Anxiety    Arthritis    Back pain    Peripheral vascular disease (HCC)    Shingles     Past Surgical History:  Procedure Laterality Date   BYPASS GRAFT POPLITEAL TO POPLITEAL Left 09/20/2015   Procedure: BYPASS GRAFT POPLITEAL TO PERONEAL;  Surgeon: Renford Dills, MD;  Location: [facility];  Service: Vascular;  Laterality: Left;   EMBOLECTOMY Left 09/20/2015   Procedure: EMBOLECTOMY;  Surgeon: Renford Dills, MD;  Location: [facility];  Service: Vascular;  Laterality: Left;   LOWER EXTREMITY ANGIOGRAPHY Left 07/22/2017   Procedure: Lower Extremity Angiography;  Surgeon: Renford Dills, MD;  Location: [facility];  Service: Cardiovascular;  Laterality: Left;   LOWER EXTREMITY INTERVENTION  07/22/2017   Procedure: LOWER EXTREMITY INTERVENTION;  Surgeon: Renford Dills, MD;  Location: [facility];  Service: Cardiovascular;;   LUMBAR LAMINECTOMY/DECOMPRESSION MICRODISCECTOMY Right 12/29/2012   Procedure: SPINE: LAMINOTOMY DECOMPRESSION NERVE ROOT EXCISION HERNIATED DISK 1 SPACE LUMBAR;  Surgeon: Mat Carne, MD;  Location: [facility];  Service: Orthopedics;  Laterality: Right;  RIGHT L5-S1 DISC EXCISION   ORIF ANKLE FRACTURE Left 09/20/2015   Procedure: OPEN REDUCTION INTERNAL FIXATION (ORIF) ANKLE FRACTURE;  Surgeon: Kennedy Bucker, MD;  Location: [facility];  Service: Orthopedics;  Laterality: Left;   PERIPHERAL VASCULAR CATHETERIZATION Left 03/21/2015   Procedure: Lower Extremity Angiography;  Surgeon: Renford Dills, MD;  Location: [facility];  Service: Cardiovascular;  Laterality: Left;   PERIPHERAL VASCULAR CATHETERIZATION  03/21/2015    Procedure: Lower Extremity Intervention;  Surgeon: Renford Dills, MD;  Location: [facility];  Service: Cardiovascular;;   PERIPHERAL VASCULAR CATHETERIZATION Left 09/05/2015   Procedure: Lower Extremity Angiography;  Surgeon: Renford Dills, MD;  Location: [facility];  Service: Cardiovascular;  Laterality: Left;   PERIPHERAL VASCULAR CATHETERIZATION  09/05/2015   Procedure: Lower Extremity Intervention;  Surgeon: Renford Dills, MD;  Location: [facility];  Service: Cardiovascular;;   PERIPHERAL VASCULAR CATHETERIZATION Left 08/27/2016   Procedure: Lower Extremity Angiography;  Surgeon: Renford Dills, MD;  Location: [facility];  Service: Cardiovascular;  Laterality: Left;   TOOTH EXTRACTION     WISDOM TOOTH EXTRACTION      Social History Social History   Tobacco Use   Smoking status: Some Days    Packs/day: 0.50    Years: 16.00    Pack years: 8.00    Types: Cigarettes   Smokeless tobacco: Never  Vaping Use   Vaping Use: Never used  Substance Use Topics   Alcohol use: No    Alcohol/week: 0.0 standard drinks    Comment: rare   Drug use: No    Family History Family History  Problem Relation Age of Onset   Cancer Mother    Diabetes Mother    Hypertension Mother    Hyperlipidemia Mother    Heart attack Father    Cancer Father     No Known Allergies   REVIEW OF SYSTEMS (Negative unless checked)  Constitutional: [] Weight loss  [] Fever  [] Chills Cardiac: [] Chest pain   [] Chest pressure   [] Palpitations   []   Shortness of breath when laying flat   [] Shortness of breath with exertion. Vascular:  [] Pain in legs with walking   [] Pain in legs at rest  [] History of DVT   [] Phlebitis   [] Swelling in legs   [] Varicose veins   [] Non-healing ulcers Pulmonary:   [] Uses home oxygen   [] Productive cough   [] Hemoptysis   [] Wheeze  [] COPD   [] Asthma Neurologic:  [] Dizziness   [] Seizures   [] History of stroke   [] History of TIA  [] Aphasia    [] Vissual changes   [] Weakness or numbness in arm   [] Weakness or numbness in leg Musculoskeletal:   [] Joint swelling   [] Joint pain   [] Low back pain Hematologic:  [] Easy bruising  [] Easy bleeding   [] Hypercoagulable state   [] Anemic Gastrointestinal:  [] Diarrhea   [] Vomiting  [] Gastroesophageal reflux/heartburn   [] Difficulty swallowing. Genitourinary:  [] Chronic kidney disease   [] Difficult urination  [] Frequent urination   [] Blood in urine Skin:  [] Rashes   [] Ulcers  Psychological:  [] History of anxiety   []  History of major depression.  Physical Examination  There were no vitals filed for this visit. There is no height or weight on file to calculate BMI. Gen: WD/WN, NAD Head: [place]/AT, No temporalis wasting.  Ear/Nose/Throat: Hearing grossly intact, nares w/o erythema or drainage Eyes: PER, EOMI, sclera nonicteric.  Neck: Supple, no masses.  No bruit or JVD.  Pulmonary:  Good air movement, no audible wheezing, no use of accessory muscles.  Cardiac: RRR, normal S1, S2, no Murmurs. Vascular:  *** Vessel Right Left  Radial Palpable Palpable  Carotid Palpable Palpable  PT Palpable Palpable  DP Palpable Palpable  Gastrointestinal: soft, non-distended. No guarding/no peritoneal signs.  Musculoskeletal: M/S 5/5 throughout.  No visible deformity.  Neurologic: CN 2-12 intact. Pain and light touch intact in extremities.  Symmetrical.  Speech is fluent. Motor exam as listed above. Psychiatric: Judgment intact, Mood & affect appropriate for pt's clinical situation. Dermatologic: No rashes or ulcers noted.  No changes consistent with cellulitis.   CBC Lab Results  Component Value Date   WBC 12.2 (H) 09/21/2015   HGB 11.9 (L) 09/21/2015   HCT 36.2 (L) 09/21/2015   MCV 89.3 09/21/2015   PLT 241 09/21/2015    BMET    Component Value Date/Time   NA 140 09/21/2015 0801   K 4.4 09/21/2015 0801   CL 104 09/21/2015 0801   CO2 30 09/21/2015 0801   GLUCOSE 108 (H) 09/21/2015 0801   BUN  19 07/21/2017 0809   CREATININE 0.99 07/21/2017 0809   CREATININE 1.03 09/18/2015 1601   CALCIUM 8.5 (L) 09/21/2015 0801   GFRNONAA >60 07/21/2017 0809   GFRAA >60 07/21/2017 0809   CrCl cannot be calculated (Patient's most recent lab result is older than the maximum 21 days allowed.).  COAG Lab Results  Component Value Date   INR 0.95 09/18/2015   INR 1.01 12/28/2012    Radiology No results found.   Assessment/Plan There are no diagnoses linked to this encounter.   , MD  07/02/2021 1:56 PM

## 2021-07-23 ENCOUNTER — Other Ambulatory Visit: Payer: Self-pay

## 2021-07-23 ENCOUNTER — Ambulatory Visit (INDEPENDENT_AMBULATORY_CARE_PROVIDER_SITE_OTHER): Payer: BC Managed Care – PPO | Admitting: Vascular Surgery

## 2021-07-23 ENCOUNTER — Encounter (INDEPENDENT_AMBULATORY_CARE_PROVIDER_SITE_OTHER): Payer: Self-pay | Admitting: Vascular Surgery

## 2021-07-23 VITALS — BP 134/90 | HR 58 | Ht 72.0 in | Wt 214.0 lb

## 2021-07-23 DIAGNOSIS — I731 Thromboangiitis obliterans [Buerger's disease]: Secondary | ICD-10-CM

## 2021-07-23 NOTE — Progress Notes (Signed)
MRN : [id number]  Harold [name] is a 40 y.o. (1981-07-24) male who presents with chief complaint of check right foot.  History of Present Illness:   He returns to check his foot.  He notes he has good days and bad days and the pain seems to be mostly at the 4th toe.  No new ulcers.  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     Patient notes the pain is always associated with activity and is very consistent day today. Typically, the pain occurs at less than one block, progress is as activity continues to the point that the patient must stop walking.     There is a small area or corn that is quite tender to the touch associated with the third toe.  He has tried using silicone toe spacers but found them extremely uncomfortable. The lamb's wool is a little helpful.   The patient denies rest pain or dangling of an extremity off the side of the bed during the night for relief.    No outpatient medications have been marked as taking for the 07/23/21 encounter (Office Visit) with [name], [name], MD.    Past Medical History:  Diagnosis Date   Anxiety    Arthritis    Back pain    Peripheral vascular disease (HCC)    Shingles     Past Surgical History:  Procedure Laterality Date   BYPASS GRAFT POPLITEAL TO POPLITEAL Left 09/20/2015   Procedure: BYPASS GRAFT POPLITEAL TO PERONEAL;  Surgeon: Renford Dills, MD;  Location: [facility];  Service: Vascular;  Laterality: Left;   EMBOLECTOMY Left 09/20/2015   Procedure: EMBOLECTOMY;  Surgeon: Renford Dills, MD;  Location: [facility];  Service: Vascular;  Laterality: Left;   LOWER EXTREMITY ANGIOGRAPHY Left 07/22/2017   Procedure: Lower Extremity Angiography;  Surgeon: Renford Dills, MD;  Location: [facility];  Service: Cardiovascular;  Laterality: Left;   LOWER EXTREMITY INTERVENTION  07/22/2017   Procedure: LOWER EXTREMITY INTERVENTION;  Surgeon: Renford Dills, MD;  Location: [facility];  Service: Cardiovascular;;   LUMBAR LAMINECTOMY/DECOMPRESSION MICRODISCECTOMY Right 12/29/2012   Procedure: SPINE: LAMINOTOMY DECOMPRESSION NERVE ROOT EXCISION HERNIATED DISK 1 SPACE LUMBAR;  Surgeon: Mat Carne, MD;  Location: [facility];  Service: Orthopedics;  Laterality: Right;  RIGHT L5-S1 DISC EXCISION   ORIF ANKLE FRACTURE Left 09/20/2015   Procedure: OPEN REDUCTION INTERNAL FIXATION (ORIF) ANKLE FRACTURE;  Surgeon: Kennedy Bucker, MD;  Location: [facility];  Service: Orthopedics;  Laterality: Left;   PERIPHERAL VASCULAR CATHETERIZATION Left 03/21/2015   Procedure: Lower Extremity Angiography;  Surgeon: Renford Dills, MD;  Location: [facility];  Service: Cardiovascular;  Laterality: Left;   PERIPHERAL VASCULAR CATHETERIZATION  03/21/2015   Procedure: Lower Extremity Intervention;  Surgeon: Renford Dills, MD;  Location: [facility];  Service: Cardiovascular;;   PERIPHERAL VASCULAR CATHETERIZATION Left 09/05/2015   Procedure: Lower Extremity Angiography;  Surgeon: Renford Dills, MD;  Location: [facility];  Service: Cardiovascular;  Laterality: Left;   PERIPHERAL VASCULAR CATHETERIZATION  09/05/2015   Procedure: Lower Extremity Intervention;  Surgeon: Renford Dills, MD;  Location: [facility];  Service: Cardiovascular;;   PERIPHERAL VASCULAR CATHETERIZATION Left 08/27/2016   Procedure: Lower Extremity Angiography;  Surgeon: Renford Dills, MD;  Location: [facility];  Service: Cardiovascular;  Laterality: Left;   TOOTH EXTRACTION  WISDOM TOOTH EXTRACTION      Social History Social History   Tobacco Use   Smoking status: Some Days    Packs/day: 0.50    Years: 16.00    Pack years: 8.00    Types: Cigarettes   Smokeless tobacco: Never  Vaping Use   Vaping Use: Never used  Substance Use Topics   Alcohol use: No    Alcohol/week: 0.0 standard drinks    Comment: rare   Drug  use: No    Family History Family History  Problem Relation Age of Onset   Cancer Mother    Diabetes Mother    Hypertension Mother    Hyperlipidemia Mother    Heart attack Father    Cancer Father     No Known Allergies   REVIEW OF SYSTEMS (Negative unless checked)  Constitutional: [] Weight loss  [] Fever  [] Chills Cardiac: [] Chest pain   [] Chest pressure   [] Palpitations   [] Shortness of breath when laying flat   [] Shortness of breath with exertion. Vascular:  [x] Pain in legs with walking   [x] Pain in legs at rest  [] History of DVT   [] Phlebitis   [] Swelling in legs   [] Varicose veins   [] Non-healing ulcers Pulmonary:   [] Uses home oxygen   [] Productive cough   [] Hemoptysis   [] Wheeze  [] COPD   [] Asthma Neurologic:  [] Dizziness   [] Seizures   [] History of stroke   [] History of TIA  [] Aphasia   [] Vissual changes   [] Weakness or numbness in arm   [] Weakness or numbness in leg Musculoskeletal:   [] Joint swelling   [] Joint pain   [] Low back pain Hematologic:  [] Easy bruising  [] Easy bleeding   [] Hypercoagulable state   [] Anemic Gastrointestinal:  [] Diarrhea   [] Vomiting  [] Gastroesophageal reflux/heartburn   [] Difficulty swallowing. Genitourinary:  [] Chronic kidney disease   [] Difficult urination  [] Frequent urination   [] Blood in urine Skin:  [] Rashes   [] Ulcers  Psychological:  [] History of anxiety   []  History of major depression.  Physical Examination  Vitals:   07/23/21 1601  BP: 134/90  Pulse: (!) 58  Weight: 214 lb (97.1 kg)  Height: 6' (1.829 m)   Body mass index is 29.02 kg/m. Gen: WD/WN, NAD Head: [place]/AT, No temporalis wasting.  Ear/Nose/Throat: Hearing grossly intact, nares w/o erythema or drainage Eyes: PER, EOMI, sclera nonicteric.  Neck: Supple, no masses.  No bruit or JVD.  Pulmonary:  Good air movement, no audible wheezing, no use of accessory muscles.  Cardiac: RRR, normal S1, S2, no Murmurs. Vascular:   left foot is pink and warm with normal hair growth and  nail growth. Great toe amp site well healed Vessel Right Left  Radial Palpable Palpable  Carotid Palpable Palpable  PT Palpable Not Palpable  DP Palpable Not Palpable  Gastrointestinal: soft, non-distended. No guarding/no peritoneal signs.  Musculoskeletal: M/S 5/5 throughout.  No visible deformity.  Neurologic: CN 2-12 intact. Pain and light touch intact in extremities.  Symmetrical.  Speech is fluent. Motor exam as listed above. Psychiatric: Judgment intact, Mood & affect appropriate for pt's clinical situation. Dermatologic: No rashes or ulcers noted.  No changes consistent with cellulitis.   CBC Lab Results  Component Value Date   WBC 12.2 (H) 09/21/2015   HGB 11.9 (L) 09/21/2015   HCT 36.2 (L) 09/21/2015   MCV 89.3 09/21/2015   PLT 241 09/21/2015    BMET    Component Value Date/Time   NA 140 09/21/2015 0801   K 4.4 09/21/2015 0801  CL 104 09/21/2015 0801   CO2 30 09/21/2015 0801   GLUCOSE 108 (H) 09/21/2015 0801   BUN 19 07/21/2017 0809   CREATININE 0.99 07/21/2017 0809   CREATININE 1.03 09/18/2015 1601   CALCIUM 8.5 (L) 09/21/2015 0801   GFRNONAA >60 07/21/2017 0809   GFRAA >60 07/21/2017 0809   CrCl cannot be calculated (Patient's most recent lab result is older than the maximum 21 days allowed.).  COAG Lab Results  Component Value Date   INR 0.95 09/18/2015   INR 1.01 12/28/2012    Radiology No results found.   Assessment/Plan 1. Thromboangiitis obliterans (Buerger's disease) (HCC) Recommend:   The patient has evidence of atherosclerosis of the lower extremities with claudication.  The patient does not voice lifestyle limiting changes at this point in time.  With respect to the irritation between his toes since the silicon spacers did not work I have recommended he try small tufts of lambswool placed between the toes to give some cushioning and some separation and this did seem to help.   Noninvasive studies do not suggest clinically significant  change his study is pretty much the same as it was a couple years ago.   No invasive studies, angiography or surgery at this time The patient should continue walking and begin a more formal exercise program.  The patient should continue antiplatelet therapy and aggressive treatment of the lipid abnormalities   No changes in the patient's medications at this time he had significant side effects from cilostazol and did not find it to be particularly helpful or improve his lower extremity symptoms.    Levora Dredge, MD  07/23/2021 4:04 PM

## 2022-01-21 ENCOUNTER — Ambulatory Visit (INDEPENDENT_AMBULATORY_CARE_PROVIDER_SITE_OTHER): Payer: BC Managed Care – PPO | Admitting: Vascular Surgery

## 2022-01-31 ENCOUNTER — Telehealth (INDEPENDENT_AMBULATORY_CARE_PROVIDER_SITE_OTHER): Payer: Self-pay | Admitting: Vascular Surgery

## 2022-01-31 ENCOUNTER — Other Ambulatory Visit (INDEPENDENT_AMBULATORY_CARE_PROVIDER_SITE_OTHER): Payer: Self-pay

## 2022-01-31 MED ORDER — CLOPIDOGREL BISULFATE 75 MG PO TABS: 75.0000 mg | ORAL_TABLET | Freq: Every day | ORAL | 8 refills | Status: DC

## 2022-01-31 NOTE — Telephone Encounter (Signed)
Patient's wife called and stated patient did not have enough of his blood thinner (Plavix) to cover him until his next appointment.  He only have about 5 more left.  [facility] [place], zip code [address]. ?

## 2022-01-31 NOTE — Telephone Encounter (Signed)
sent 

## 2022-02-25 ENCOUNTER — Ambulatory Visit (INDEPENDENT_AMBULATORY_CARE_PROVIDER_SITE_OTHER): Payer: BC Managed Care – PPO | Admitting: Vascular Surgery

## 2022-11-16 ENCOUNTER — Other Ambulatory Visit (INDEPENDENT_AMBULATORY_CARE_PROVIDER_SITE_OTHER): Payer: Self-pay | Admitting: Vascular Surgery

## 2023-01-30 ENCOUNTER — Other Ambulatory Visit (INDEPENDENT_AMBULATORY_CARE_PROVIDER_SITE_OTHER): Payer: Self-pay

## 2023-01-30 ENCOUNTER — Telehealth (INDEPENDENT_AMBULATORY_CARE_PROVIDER_SITE_OTHER): Payer: Self-pay | Admitting: Vascular Surgery

## 2023-01-30 MED ORDER — CLOPIDOGREL BISULFATE 75 MG PO TABS: 75.0000 mg | ORAL_TABLET | Freq: Every day | ORAL | 8 refills | Status: DC

## 2023-01-30 NOTE — Telephone Encounter (Signed)
Patient called in requesting a refill on medication   clopidogrel (PLAVIX) 75 MG tablet      [id number] - [place], [place] - [address]

## 2023-01-30 NOTE — Telephone Encounter (Signed)
sent 

## 2023-02-04 ENCOUNTER — Other Ambulatory Visit (INDEPENDENT_AMBULATORY_CARE_PROVIDER_SITE_OTHER): Payer: Self-pay | Admitting: Nurse Practitioner

## 2023-02-04 DIAGNOSIS — M79672 Pain in left foot: Secondary | ICD-10-CM

## 2023-02-04 DIAGNOSIS — Z9889 Other specified postprocedural states: Secondary | ICD-10-CM

## 2023-02-05 ENCOUNTER — Ambulatory Visit (INDEPENDENT_AMBULATORY_CARE_PROVIDER_SITE_OTHER): Payer: BC Managed Care – PPO

## 2023-02-05 DIAGNOSIS — Z9889 Other specified postprocedural states: Secondary | ICD-10-CM

## 2023-02-05 DIAGNOSIS — I739 Peripheral vascular disease, unspecified: Secondary | ICD-10-CM | POA: Diagnosis not present

## 2023-02-05 DIAGNOSIS — M79672 Pain in left foot: Secondary | ICD-10-CM

## 2023-02-09 NOTE — Progress Notes (Unsigned)
MRN : [id number]  Harold [name] is a 42 y.o. (Jun 15, 1981) male who presents with chief complaint of check circulation.  History of Present Illness:   He returns to check his foot.  He notes he has good days and bad days and the pain seems to be mostly at the 4th toe.  No new ulcers.   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     Patient notes the pain is always associated with activity and is very consistent day today. Typically, the pain occurs at less than one block, progress is as activity continues to the point that the patient must stop walking.     There is a small area or corn that is quite tender to the touch associated with the third toe.  He has tried using silicone toe spacers but found them extremely uncomfortable. The lamb's wool is a little helpful.   The patient denies rest pain or dangling of an extremity off the side of the bed during the night for relief.   No outpatient medications have been marked as taking for the 02/10/23 encounter (Appointment) with [name], [name], MD.    Past Medical History:  Diagnosis Date   Anxiety    Arthritis    Back pain    Peripheral vascular disease (HCC)    Shingles     Past Surgical History:  Procedure Laterality Date   BYPASS GRAFT POPLITEAL TO POPLITEAL Left 09/20/2015   Procedure: BYPASS GRAFT POPLITEAL TO PERONEAL;  Surgeon: Renford Dills, MD;  Location: [facility];  Service: Vascular;  Laterality: Left;   EMBOLECTOMY Left 09/20/2015   Procedure: EMBOLECTOMY;  Surgeon: Renford Dills, MD;  Location: [facility];  Service: Vascular;  Laterality: Left;   LOWER EXTREMITY ANGIOGRAPHY Left 07/22/2017   Procedure: Lower Extremity Angiography;  Surgeon: Renford Dills, MD;  Location: [facility];  Service: Cardiovascular;  Laterality: Left;   LOWER EXTREMITY INTERVENTION  07/22/2017   Procedure: LOWER EXTREMITY INTERVENTION;   Surgeon: Renford Dills, MD;  Location: [facility];  Service: Cardiovascular;;   LUMBAR LAMINECTOMY/DECOMPRESSION MICRODISCECTOMY Right 12/29/2012   Procedure: SPINE: LAMINOTOMY DECOMPRESSION NERVE ROOT EXCISION HERNIATED DISK 1 SPACE LUMBAR;  Surgeon: Mat Carne, MD;  Location: [facility] OR;  Service: Orthopedics;  Laterality: Right;  RIGHT L5-S1 DISC EXCISION   ORIF ANKLE FRACTURE Left 09/20/2015   Procedure: OPEN REDUCTION INTERNAL FIXATION (ORIF) ANKLE FRACTURE;  Surgeon: Kennedy Bucker, MD;  Location: [facility];  Service: Orthopedics;  Laterality: Left;   PERIPHERAL VASCULAR CATHETERIZATION Left 03/21/2015   Procedure: Lower Extremity Angiography;  Surgeon: Renford Dills, MD;  Location: [facility];  Service: Cardiovascular;  Laterality: Left;   PERIPHERAL VASCULAR CATHETERIZATION  03/21/2015   Procedure: Lower Extremity Intervention;  Surgeon: Renford Dills, MD;  Location: [facility];  Service: Cardiovascular;;   PERIPHERAL VASCULAR CATHETERIZATION Left 09/05/2015   Procedure: Lower Extremity Angiography;  Surgeon: Renford Dills, MD;  Location: [facility];  Service: Cardiovascular;  Laterality: Left;   PERIPHERAL VASCULAR CATHETERIZATION  09/05/2015   Procedure: Lower Extremity Intervention;  Surgeon: Renford Dills, MD;  Location: [facility];  Service: Cardiovascular;;   PERIPHERAL VASCULAR CATHETERIZATION Left 08/27/2016   Procedure: Lower Extremity Angiography;  Surgeon: Renford Dills, MD;  Location: [facility];  Service: Cardiovascular;  Laterality: Left;   TOOTH EXTRACTION     WISDOM TOOTH EXTRACTION      Social History Social History   Tobacco Use   Smoking status: Some Days    Packs/day: 0.50    Years: 16.00    Additional pack years: 0.00    Total pack years: 8.00    Types: Cigarettes   Smokeless tobacco: Never  Vaping Use   Vaping Use: Never used  Substance Use Topics   Alcohol use: No     Alcohol/week: 0.0 standard drinks of alcohol    Comment: rare   Drug use: No    Family History Family History  Problem Relation Age of Onset   Cancer Mother    Diabetes Mother    Hypertension Mother    Hyperlipidemia Mother    Heart attack Father    Cancer Father     No Known Allergies   REVIEW OF SYSTEMS (Negative unless checked)  Constitutional: [] Weight loss  [] Fever  [] Chills Cardiac: [] Chest pain   [] Chest pressure   [] Palpitations   [] Shortness of breath when laying flat   [] Shortness of breath with exertion. Vascular:  [x] Pain in legs with walking   [] Pain in legs at rest  [] History of DVT   [] Phlebitis   [] Swelling in legs   [] Varicose veins   [] Non-healing ulcers Pulmonary:   [] Uses home oxygen   [] Productive cough   [] Hemoptysis   [] Wheeze  [] COPD   [] Asthma Neurologic:  [] Dizziness   [] Seizures   [] History of stroke   [] History of TIA  [] Aphasia   [] Vissual changes   [] Weakness or numbness in arm   [] Weakness or numbness in leg Musculoskeletal:   [] Joint swelling   [] Joint pain   [] Low back pain Hematologic:  [] Easy bruising  [] Easy bleeding   [] Hypercoagulable state   [] Anemic Gastrointestinal:  [] Diarrhea   [] Vomiting  [] Gastroesophageal reflux/heartburn   [] Difficulty swallowing. Genitourinary:  [] Chronic kidney disease   [] Difficult urination  [] Frequent urination   [] Blood in urine Skin:  [] Rashes   [] Ulcers  Psychological:  [] History of anxiety   []  History of major depression.  Physical Examination  There were no vitals filed for this visit. There is no height or weight on file to calculate BMI. Gen: WD/WN, NAD Head: [place]/AT, No temporalis wasting.  Ear/Nose/Throat: Hearing grossly intact, nares w/o erythema or drainage Eyes: PER, EOMI, sclera nonicteric.  Neck: Supple, no masses.  No bruit or JVD.  Pulmonary:  Good air movement, no audible wheezing, no use of accessory muscles.  Cardiac: RRR, normal S1, S2, no Murmurs. Vascular:  mild trophic changes, no  open wounds Vessel Right Left  Radial Palpable Palpable  PT Not Palpable Not Palpable  DP Not Palpable Not Palpable  Gastrointestinal: soft, non-distended. No guarding/no peritoneal signs.  Musculoskeletal: M/S 5/5 throughout.  No visible deformity.  Neurologic: CN 2-12 intact. Pain and light touch intact in extremities.  Symmetrical.  Speech is fluent. Motor exam as listed above. Psychiatric: Judgment intact, Mood & affect appropriate for pt's clinical situation. Dermatologic: No rashes or ulcers noted.  No changes consistent with cellulitis.   CBC Lab Results  Component Value Date   WBC 12.2 (H) 09/21/2015   HGB 11.9 (L) 09/21/2015   HCT 36.2 (L) 09/21/2015   MCV 89.3 09/21/2015   PLT 241 09/21/2015    BMET    Component Value Date/Time   NA 140 09/21/2015 0801   K 4.4 09/21/2015 0801   CL 104 09/21/2015 0801  CO2 30 09/21/2015 0801   GLUCOSE 108 (H) 09/21/2015 0801   BUN 19 07/21/2017 0809   CREATININE 0.99 07/21/2017 0809   CREATININE 1.03 09/18/2015 1601   CALCIUM 8.5 (L) 09/21/2015 0801   GFRNONAA >60 07/21/2017 0809   GFRAA >60 07/21/2017 0809   CrCl cannot be calculated (Patient's most recent lab result is older than the maximum 21 days allowed.).  COAG Lab Results  Component Value Date   INR 0.95 09/18/2015   INR 1.01 12/28/2012    Radiology No results found.   Assessment/Plan There are no diagnoses linked to this encounter.   Levora Dredge, MD  02/09/2023 11:14 AM

## 2023-02-10 ENCOUNTER — Encounter (INDEPENDENT_AMBULATORY_CARE_PROVIDER_SITE_OTHER): Payer: Self-pay | Admitting: Vascular Surgery

## 2023-02-10 ENCOUNTER — Ambulatory Visit (INDEPENDENT_AMBULATORY_CARE_PROVIDER_SITE_OTHER): Payer: BC Managed Care – PPO | Admitting: Vascular Surgery

## 2023-02-10 VITALS — BP 141/94 | HR 69 | Resp 18 | Ht 72.0 in | Wt 213.0 lb

## 2023-02-10 DIAGNOSIS — I731 Thromboangiitis obliterans [Buerger's disease]: Secondary | ICD-10-CM

## 2023-02-10 DIAGNOSIS — E7800 Pure hypercholesterolemia, unspecified: Secondary | ICD-10-CM | POA: Diagnosis not present

## 2023-02-10 DIAGNOSIS — I70222 Atherosclerosis of native arteries of extremities with rest pain, left leg: Secondary | ICD-10-CM

## 2023-02-12 ENCOUNTER — Encounter (INDEPENDENT_AMBULATORY_CARE_PROVIDER_SITE_OTHER): Payer: Self-pay | Admitting: Vascular Surgery

## 2023-02-12 MED ORDER — LIDOCAINE-PRILOCAINE 2.5-2.5 % EX CREA: 1.0000 | TOPICAL_CREAM | Freq: Two times a day (BID) | CUTANEOUS | 4 refills | Status: DC | PRN

## 2023-02-17 LAB — VAS US ABI WITH/WO TBI
Left ABI: 0.66
Right ABI: 1.2

## 2023-04-11 ENCOUNTER — Telehealth: Payer: Self-pay

## 2023-04-11 ENCOUNTER — Other Ambulatory Visit: Payer: Self-pay

## 2023-04-11 DIAGNOSIS — Z1211 Encounter for screening for malignant neoplasm of colon: Secondary | ICD-10-CM

## 2023-04-11 MED ORDER — NA SULFATE-K SULFATE-MG SULF 17.5-3.13-1.6 GM/177ML PO SOLN: 1.0000 | Freq: Once | ORAL | 0 refills | Status: AC

## 2023-04-11 NOTE — Telephone Encounter (Signed)
Gastroenterology Pre-Procedure Review  Request Date: 05/30/23 Requesting Physician: Dr. Tobi Bastos  PATIENT REVIEW QUESTIONS: The patient responded to the following health history questions as indicated:    1. Are you having any GI issues? no 2. Do you have a personal history of Polyps? no 3. Do you have a family history of Colon Cancer or Polyps? no 4. Diabetes Mellitus? no 5. Joint replacements in the past 12 months?no 6. Major health problems in the past 3 months?no 7. Any artificial heart valves, MVP, or defibrillator?no    MEDICATIONS & ALLERGIES:    Patient reports the following regarding taking any anticoagulation/antiplatelet therapy:   Plavix, Coumadin, Eliquis, Xarelto, Lovenox, Pradaxa, Brilinta, or Effient? yes (Eliquis prescribed by Dr. [name].  Blood thinner advice sent) Aspirin? yes (81 mg daily)  Patient confirms/reports the following medications:  Current Outpatient Medications  Medication Sig Dispense Refill   aspirin EC 81 MG tablet Take 81 mg by mouth daily. Swallow whole.     clopidogrel (PLAVIX) 75 MG tablet Take 1 tablet (75 mg total) by mouth daily. 30 tablet 8   atorvastatin (LIPITOR) 40 MG tablet Take 40 mg by mouth daily.      lidocaine-prilocaine (EMLA) cream Apply 1 Application topically every 12 (twelve) hours as needed. (Patient not taking: Reported on 04/11/2023) 30 g 4   No current facility-administered medications for this visit.    Patient confirms/reports the following allergies:  No Known Allergies  No orders of the defined types were placed in this encounter.   AUTHORIZATION INFORMATION Primary Insurance: 1D#: Group #:  Secondary Insurance: 1D#: Group #:  SCHEDULE INFORMATION: Date: 05/30/23 Time: Location: [facility]

## 2023-05-16 ENCOUNTER — Telehealth: Payer: Self-pay

## 2023-05-16 NOTE — Telephone Encounter (Signed)
Blood thinner advice received from Dr. Marijean Heath office on 05/15/23 advising patient to stop Eliquis 3 days prior to procedure.  Restart blood thinner 3 days after procedure.  A voice message has been left for patient.  I will also send mychart message.  Thanks,  [place], [place]

## 2023-05-26 ENCOUNTER — Telehealth: Payer: Self-pay

## 2023-05-26 NOTE — Telephone Encounter (Signed)
Patient stated that his out of pocket cost to have his colonoscopy is going to be too expensive.  He said it will not be covered unless its coded as family history of colon cancer.  His mother is having her colonoscopy in a few weeks and he said if she has colon cancer or colon polyps he would call back to reschedule colonoscopy.  Thanks,  [place], [place]

## 2023-05-30 ENCOUNTER — Encounter: Admission: RE | Payer: Self-pay | Source: Home / Self Care

## 2023-05-30 ENCOUNTER — Ambulatory Visit
Admission: RE | Admit: 2023-05-30 | Payer: BC Managed Care – PPO | Source: Home / Self Care | Admitting: Gastroenterology

## 2023-05-30 SURGERY — COLONOSCOPY WITH PROPOFOL
Anesthesia: General

## 2023-07-04 ENCOUNTER — Encounter: Payer: Self-pay | Admitting: Podiatry

## 2023-07-04 ENCOUNTER — Ambulatory Visit: Payer: BC Managed Care – PPO | Admitting: Podiatry

## 2023-07-04 DIAGNOSIS — I739 Peripheral vascular disease, unspecified: Secondary | ICD-10-CM | POA: Diagnosis not present

## 2023-07-04 DIAGNOSIS — Z9889 Other specified postprocedural states: Secondary | ICD-10-CM

## 2023-07-04 NOTE — Progress Notes (Signed)
   Chief Complaint  Patient presents with   Toe Pain    Patient is here for left hallux pain toe was amputated but having sharp pains and other toe s getting sore/calloused    HPI: 42 y.o. male PSxHx left great toe amputation and PMHx PVD presenting today for evaluation of pain and tenderness to the left fourth toe.  Patient was last seen in the office 05/29/2021.  He does have an established history with Dr. [name], [facility].  Patient states that since the surgery he developed some calluses to the area.  He is very nervous and anxious with debridement.  He normally tries to debride these lesions himself.  He also trims his own toenails.  Past Medical History:  Diagnosis Date   Anxiety    Arthritis    Back pain    Peripheral vascular disease (HCC)    Shingles      Physical Exam: General: The patient is alert and oriented x3 in no acute distress.  Dermatology: There is some callus formation noted to the left hallux with elongated toenail.  Vascular: Skin is cool to touch.  Venous reflux and erythema noted to the foot in general.  Neurological: Light touch and protective threshold intact.  Musculoskeletal Exam: History of left great toe amputation.  Radiographic Exam LT foot 07/04/2023:  Normal osseous mineralization. Joint spaces preserved. No fracture/dislocation/boney destruction.  Absence of left great toe at the level of the MTPJ  Assessment: 1.  Known history of PVD LLE 2.  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  X-rays reviewed -Continue management with vascular -Patient preferred today that I do not perform any conservative debridements of the calluses or toenail.  He simply wanted to ensure that there was nothing overly concerning or wrong with the callus or nail. -Maintain good foot hygiene -Return to clinic as needed      Felecia Shelling, DPM [facility]  Dr. Felecia Shelling,  DPM    [address] [place], [place] [address]                Office [phone]  Fax [phone]

## 2023-11-16 ENCOUNTER — Other Ambulatory Visit (INDEPENDENT_AMBULATORY_CARE_PROVIDER_SITE_OTHER): Payer: Self-pay | Admitting: Vascular Surgery

## 2024-02-12 ENCOUNTER — Encounter (INDEPENDENT_AMBULATORY_CARE_PROVIDER_SITE_OTHER): Payer: BC Managed Care – PPO

## 2024-02-12 ENCOUNTER — Ambulatory Visit (INDEPENDENT_AMBULATORY_CARE_PROVIDER_SITE_OTHER): Payer: BC Managed Care – PPO | Admitting: Vascular Surgery

## 2024-02-12 NOTE — Progress Notes (Deleted)
 MRN : [id number]  Harold [name] is a 43 y.o. (04-29-1981) male who presents with chief complaint of check circulation.  History of Present Illness:   He returns to check his foot.  He notes he has good days and bad days and the pain seems to be mostly at the 2nd toe.  No new ulcers but at the tip of the second toe there is a small lesion and across the incisional scar of the first ray there are some punctate lesions (although I do not in looking at these feel like they are overt ulcers).   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     Patient notes the pain is always associated with activity and is very consistent day today. Typically, the pain occurs at less than one block, progress is as activity continues to the point that the patient must stop walking.      The patient denies rest pain or dangling of an extremity off the side of the bed during the night for relief.  No outpatient medications have been marked as taking for the 02/12/24 encounter (Appointment) with Prescilla Brod, Ninette Basque, MD.    Past Medical History:  Diagnosis Date   Anxiety    Arthritis    Back pain    Peripheral vascular disease (HCC)    Shingles     Past Surgical History:  Procedure Laterality Date   BYPASS GRAFT POPLITEAL TO POPLITEAL Left 09/20/2015   Procedure: BYPASS GRAFT POPLITEAL TO PERONEAL;  Surgeon: Jackquelyn Mass, MD;  Location: [facility];  Service: Vascular;  Laterality: Left;   EMBOLECTOMY Left 09/20/2015   Procedure: EMBOLECTOMY;  Surgeon: Jackquelyn Mass, MD;  Location: [facility];  Service: Vascular;  Laterality: Left;   LOWER EXTREMITY ANGIOGRAPHY Left 07/22/2017   Procedure: Lower Extremity Angiography;  Surgeon: Jackquelyn Mass, MD;  Location: [facility];  Service: Cardiovascular;  Laterality: Left;   LOWER EXTREMITY INTERVENTION  07/22/2017    Procedure: LOWER EXTREMITY INTERVENTION;  Surgeon: Jackquelyn Mass, MD;  Location: [facility];  Service: Cardiovascular;;   LUMBAR LAMINECTOMY/DECOMPRESSION MICRODISCECTOMY Right 12/29/2012   Procedure: SPINE: LAMINOTOMY DECOMPRESSION NERVE ROOT EXCISION HERNIATED DISK 1 SPACE LUMBAR;  Surgeon: Arnoldo Lapping, MD;  Location: [facility];  Service: Orthopedics;  Laterality: Right;  RIGHT L5-S1 DISC EXCISION   ORIF ANKLE FRACTURE Left 09/20/2015   Procedure: OPEN REDUCTION INTERNAL FIXATION (ORIF) ANKLE FRACTURE;  Surgeon: Molli Angelucci, MD;  Location: [facility];  Service: Orthopedics;  Laterality: Left;   PERIPHERAL VASCULAR CATHETERIZATION Left 03/21/2015   Procedure: Lower Extremity Angiography;  Surgeon: Jackquelyn Mass, MD;  Location: [facility];  Service: Cardiovascular;  Laterality: Left;   PERIPHERAL VASCULAR CATHETERIZATION  03/21/2015   Procedure: Lower Extremity Intervention;  Surgeon: Jackquelyn Mass, MD;  Location: [facility];  Service: Cardiovascular;;   PERIPHERAL VASCULAR CATHETERIZATION Left 09/05/2015   Procedure: Lower Extremity Angiography;  Surgeon: Jackquelyn Mass, MD;  Location: [facility];  Service: Cardiovascular;  Laterality: Left;  PERIPHERAL VASCULAR CATHETERIZATION  09/05/2015   Procedure: Lower Extremity Intervention;  Surgeon: Jackquelyn Mass, MD;  Location: [facility];  Service: Cardiovascular;;   PERIPHERAL VASCULAR CATHETERIZATION Left 08/27/2016   Procedure: Lower Extremity Angiography;  Surgeon: Jackquelyn Mass, MD;  Location: [facility];  Service: Cardiovascular;  Laterality: Left;   TOOTH EXTRACTION     WISDOM TOOTH EXTRACTION      Social History Social History   Tobacco Use   Smoking status: Some Days    Current packs/day: 0.50    Average packs/day: 0.5 packs/day for 16.0 years (8.0 ttl pk-yrs)    Types: Cigarettes   Smokeless tobacco: Never  Vaping Use   Vaping status: Never Used   Substance Use Topics   Alcohol use: No    Alcohol/week: 0.0 standard drinks of alcohol    Comment: rare   Drug use: No    Family History Family History  Problem Relation Age of Onset   Cancer Mother    Diabetes Mother    Hypertension Mother    Hyperlipidemia Mother    Heart attack Father    Cancer Father     No Known Allergies   REVIEW OF SYSTEMS (Negative unless checked)  Constitutional: [] Weight loss  [] Fever  [] Chills Cardiac: [] Chest pain   [] Chest pressure   [] Palpitations   [] Shortness of breath when laying flat   [] Shortness of breath with exertion. Vascular:  [x] Pain in legs with walking   [] Pain in legs at rest  [] History of DVT   [] Phlebitis   [] Swelling in legs   [] Varicose veins   [] Non-healing ulcers Pulmonary:   [] Uses home oxygen   [] Productive cough   [] Hemoptysis   [] Wheeze  [] COPD   [] Asthma Neurologic:  [] Dizziness   [] Seizures   [] History of stroke   [] History of TIA  [] Aphasia   [] Vissual changes   [] Weakness or numbness in arm   [] Weakness or numbness in leg Musculoskeletal:   [] Joint swelling   [] Joint pain   [] Low back pain Hematologic:  [] Easy bruising  [] Easy bleeding   [] Hypercoagulable state   [] Anemic Gastrointestinal:  [] Diarrhea   [] Vomiting  [] Gastroesophageal reflux/heartburn   [] Difficulty swallowing. Genitourinary:  [] Chronic kidney disease   [] Difficult urination  [] Frequent urination   [] Blood in urine Skin:  [] Rashes   [] Ulcers  Psychological:  [] History of anxiety   []  History of major depression.  Physical Examination  There were no vitals filed for this visit. There is no height or weight on file to calculate BMI. Gen: WD/WN, NAD Head: [place]/AT, No temporalis wasting.  Ear/Nose/Throat: Hearing grossly intact, nares w/o erythema or drainage Eyes: PER, EOMI, sclera nonicteric.  Neck: Supple, no masses.  No bruit or JVD.  Pulmonary:  Good air movement, no audible wheezing, no use of accessory muscles.  Cardiac: RRR, normal S1, S2, no  Murmurs. Vascular:  mild trophic changes, no open wounds Vessel Right Left  Radial Palpable Palpable  PT Not Palpable Not Palpable  DP Not Palpable Not Palpable  Gastrointestinal: soft, non-distended. No guarding/no peritoneal signs.  Musculoskeletal: M/S 5/5 throughout.  No visible deformity.  Neurologic: CN 2-12 intact. Pain and light touch intact in extremities.  Symmetrical.  Speech is fluent. Motor exam as listed above. Psychiatric: Judgment intact, Mood & affect appropriate for pt's clinical situation. Dermatologic: No rashes or ulcers noted.  No changes consistent with cellulitis.   CBC Lab Results  Component Value Date   WBC 12.2 (H) 09/21/2015   HGB  11.9 (L) 09/21/2015   HCT 36.2 (L) 09/21/2015   MCV 89.3 09/21/2015   PLT 241 09/21/2015    BMET    Component Value Date/Time   NA 140 09/21/2015 0801   K 4.4 09/21/2015 0801   CL 104 09/21/2015 0801   CO2 30 09/21/2015 0801   GLUCOSE 108 (H) 09/21/2015 0801   BUN 19 07/21/2017 0809   CREATININE 0.99 07/21/2017 0809   CREATININE 1.03 09/18/2015 1601   CALCIUM  8.5 (L) 09/21/2015 0801   GFRNONAA >60 07/21/2017 0809   GFRAA >60 07/21/2017 0809   CrCl cannot be calculated (Patient's most recent lab result is older than the maximum 21 days allowed.).  COAG Lab Results  Component Value Date   INR 0.95 09/18/2015   INR 1.01 12/28/2012    Radiology No results found.   Assessment/Plan There are no diagnoses linked to this encounter.   Devon Fogo, MD  02/12/2024 12:45 PM

## 2024-03-09 ENCOUNTER — Encounter (INDEPENDENT_AMBULATORY_CARE_PROVIDER_SITE_OTHER): Payer: Self-pay

## 2024-06-22 ENCOUNTER — Telehealth (INDEPENDENT_AMBULATORY_CARE_PROVIDER_SITE_OTHER): Payer: Self-pay | Admitting: Vascular Surgery

## 2024-06-22 ENCOUNTER — Other Ambulatory Visit (INDEPENDENT_AMBULATORY_CARE_PROVIDER_SITE_OTHER): Payer: Self-pay

## 2024-06-22 MED ORDER — CLOPIDOGREL BISULFATE 75 MG PO TABS: 75.0000 mg | ORAL_TABLET | Freq: Every day | ORAL | 2 refills | Status: DC

## 2024-06-22 NOTE — Telephone Encounter (Signed)
 Patient spouse was notified that they will need to rescheduled his missed appt to continue with refills, but I will refill medication as requested. Patient will be giving a call to the office r/s appt.

## 2024-06-22 NOTE — Telephone Encounter (Signed)
 Pt wife called and stated that pt is out of blood thinners and he needs a refill. Please call pt's wife back at [phone].

## 2024-07-21 ENCOUNTER — Other Ambulatory Visit (INDEPENDENT_AMBULATORY_CARE_PROVIDER_SITE_OTHER): Payer: Self-pay | Admitting: Vascular Surgery

## 2024-07-21 DIAGNOSIS — Z9889 Other specified postprocedural states: Secondary | ICD-10-CM

## 2024-07-22 ENCOUNTER — Ambulatory Visit (INDEPENDENT_AMBULATORY_CARE_PROVIDER_SITE_OTHER)

## 2024-07-22 ENCOUNTER — Ambulatory Visit (INDEPENDENT_AMBULATORY_CARE_PROVIDER_SITE_OTHER): Admitting: Vascular Surgery

## 2024-07-22 DIAGNOSIS — Z9889 Other specified postprocedural states: Secondary | ICD-10-CM

## 2024-07-22 DIAGNOSIS — I739 Peripheral vascular disease, unspecified: Secondary | ICD-10-CM

## 2024-07-23 LAB — VAS US ABI WITH/WO TBI
Left ABI: 0.73
Right ABI: 1.23

## 2024-08-23 ENCOUNTER — Ambulatory Visit (INDEPENDENT_AMBULATORY_CARE_PROVIDER_SITE_OTHER): Admitting: Vascular Surgery

## 2024-09-06 ENCOUNTER — Encounter (INDEPENDENT_AMBULATORY_CARE_PROVIDER_SITE_OTHER): Payer: Self-pay | Admitting: Vascular Surgery

## 2024-09-06 ENCOUNTER — Ambulatory Visit (INDEPENDENT_AMBULATORY_CARE_PROVIDER_SITE_OTHER): Admitting: Vascular Surgery

## 2024-09-06 VITALS — BP 126/80 | HR 62 | Resp 18 | Wt 202.8 lb

## 2024-09-06 DIAGNOSIS — I70213 Atherosclerosis of native arteries of extremities with intermittent claudication, bilateral legs: Secondary | ICD-10-CM | POA: Diagnosis not present

## 2024-09-06 DIAGNOSIS — E7800 Pure hypercholesterolemia, unspecified: Secondary | ICD-10-CM

## 2024-09-06 DIAGNOSIS — I731 Thromboangiitis obliterans [Buerger's disease]: Secondary | ICD-10-CM | POA: Diagnosis not present

## 2024-09-06 NOTE — Progress Notes (Signed)
 MRN : [id number]  Harold [name] is a 43 y.o. ([birth year]) male who presents with chief complaint of check circulation.  History of Present Illness:   He returns to check his left foot.  He notes he has good days and bad days and the pain seems to be mostly at the 2nd toe.  No new ulcers but at the tip of the second toe there is a small lesion and across the incisional scar of the first ray there are some punctate lesions (although I do not in looking at these feel like they are overt ulcers).   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     Patient notes the pain is always associated with activity and is very consistent day today. Typically, the pain occurs at less than one block, progress is as activity continues to the point that the patient must stop walking.      The patient denies rest pain or dangling of an extremity off the side of the bed during the night for relief.  ABI's done today Rt=1.23 (TBI=0.58) and Lt=0.73 (TBI=Amp)  Current Meds  Medication Sig   aspirin  EC 81 MG tablet Take 81 mg by mouth daily. Swallow whole.   atorvastatin  (LIPITOR) 40 MG tablet Take 40 mg by mouth daily.    clopidogrel  (PLAVIX ) 75 MG tablet Take 1 tablet (75 mg total) by mouth daily.   lidocaine -prilocaine  (EMLA ) cream Apply 1 Application topically every 12 (twelve) hours as needed.    Past Medical History:  Diagnosis Date   Anxiety    Arthritis    Back pain    Peripheral vascular disease    Shingles     Past Surgical History:  Procedure Laterality Date   BYPASS GRAFT POPLITEAL TO POPLITEAL Left 09/20/2015   Procedure: BYPASS GRAFT POPLITEAL TO PERONEAL;  Surgeon: Cordella KANDICE Shawl, MD;  Location: [facility];  Service: Vascular;  Laterality: Left;   EMBOLECTOMY Left 09/20/2015   Procedure: EMBOLECTOMY;  Surgeon: Cordella KANDICE Shawl, MD;  Location: [facility];   Service: Vascular;  Laterality: Left;   LOWER EXTREMITY ANGIOGRAPHY Left 07/22/2017   Procedure: Lower Extremity Angiography;  Surgeon: Shawl Cordella KANDICE, MD;  Location: [facility];  Service: Cardiovascular;  Laterality: Left;   LOWER EXTREMITY INTERVENTION  07/22/2017   Procedure: LOWER EXTREMITY INTERVENTION;  Surgeon: Shawl Cordella KANDICE, MD;  Location: [facility];  Service: Cardiovascular;;   LUMBAR LAMINECTOMY/DECOMPRESSION MICRODISCECTOMY Right 12/29/2012   Procedure: SPINE: LAMINOTOMY DECOMPRESSION NERVE ROOT EXCISION HERNIATED DISK 1 SPACE LUMBAR;  Surgeon: Lacinda Ozell Mans, MD;  Location: [facility];  Service: Orthopedics;  Laterality: Right;  RIGHT L5-S1 DISC EXCISION   ORIF ANKLE FRACTURE Left 09/20/2015   Procedure: OPEN REDUCTION INTERNAL FIXATION (ORIF) ANKLE FRACTURE;  Surgeon: Ozell Flake, MD;  Location: [facility];  Service: Orthopedics;  Laterality: Left;   PERIPHERAL VASCULAR CATHETERIZATION Left 03/21/2015   Procedure: Lower Extremity Angiography;  Surgeon: Cordella KANDICE Shawl, MD;  Location: [facility];  Service: Cardiovascular;  Laterality: Left;   PERIPHERAL  VASCULAR CATHETERIZATION  03/21/2015   Procedure: Lower Extremity Intervention;  Surgeon: Cordella KANDICE Shawl, MD;  Location: [facility];  Service: Cardiovascular;;   PERIPHERAL VASCULAR CATHETERIZATION Left 09/05/2015   Procedure: Lower Extremity Angiography;  Surgeon: Cordella KANDICE Shawl, MD;  Location: [facility];  Service: Cardiovascular;  Laterality: Left;   PERIPHERAL VASCULAR CATHETERIZATION  09/05/2015   Procedure: Lower Extremity Intervention;  Surgeon: Cordella KANDICE Shawl, MD;  Location: [facility];  Service: Cardiovascular;;   PERIPHERAL VASCULAR CATHETERIZATION Left 08/27/2016   Procedure: Lower Extremity Angiography;  Surgeon: Cordella KANDICE Shawl, MD;  Location: [facility];  Service: Cardiovascular;  Laterality: Left;   TOOTH EXTRACTION     WISDOM TOOTH  EXTRACTION      Social History Social History   Tobacco Use   Smoking status: Some Days    Current packs/day: 0.50    Average packs/day: 0.5 packs/day for 16.0 years (8.0 ttl pk-yrs)    Types: Cigarettes   Smokeless tobacco: Never  Vaping Use   Vaping status: Never Used  Substance Use Topics   Alcohol use: No    Alcohol/week: 0.0 standard drinks of alcohol    Comment: rare   Drug use: No    Family History Family History  Problem Relation Age of Onset   Cancer Mother    Diabetes Mother    Hypertension Mother    Hyperlipidemia Mother    Heart attack Father    Cancer Father     No Known Allergies   REVIEW OF SYSTEMS (Negative unless checked)  Constitutional: [] Weight loss  [] Fever  [] Chills Cardiac: [] Chest pain   [] Chest pressure   [] Palpitations   [] Shortness of breath when laying flat   [] Shortness of breath with exertion. Vascular:  [x] Pain in legs with walking   [] Pain in legs at rest  [] History of DVT   [] Phlebitis   [] Swelling in legs   [] Varicose veins   [] Non-healing ulcers Pulmonary:   [] Uses home oxygen   [] Productive cough   [] Hemoptysis   [] Wheeze  [] COPD   [] Asthma Neurologic:  [] Dizziness   [] Seizures   [] History of stroke   [] History of TIA  [] Aphasia   [] Vissual changes   [] Weakness or numbness in arm   [] Weakness or numbness in leg Musculoskeletal:   [] Joint swelling   [] Joint pain   [] Low back pain Hematologic:  [] Easy bruising  [] Easy bleeding   [] Hypercoagulable state   [] Anemic Gastrointestinal:  [] Diarrhea   [] Vomiting  [] Gastroesophageal reflux/heartburn   [] Difficulty swallowing. Genitourinary:  [] Chronic kidney disease   [] Difficult urination  [] Frequent urination   [] Blood in urine Skin:  [] Rashes   [] Ulcers  Psychological:  [] History of anxiety   []  History of major depression.  Physical Examination  Vitals:   09/06/24 1532  BP: 126/80  Pulse: 62  Resp: 18  Weight: 202 lb 12.8 oz (92 kg)   Body mass index is 27.5 kg/m. Gen: WD/WN,  NAD Head: [place]/AT, No temporalis wasting.  Ear/Nose/Throat: Hearing grossly intact, nares w/o erythema or drainage Eyes: PER, EOMI, sclera nonicteric.  Neck: Supple, no masses.  No bruit or JVD.  Pulmonary:  Good air movement, no audible wheezing, no use of accessory muscles.  Cardiac: RRR, normal S1, S2, no Murmurs. Vascular:  mild trophic changes, no open wounds Vessel Right Left  Radial Palpable Palpable  PT Palpable Not Palpable  DP Palpable Not Palpable  Gastrointestinal: soft, non-distended. No guarding/no peritoneal signs.  Musculoskeletal: M/S 5/5 throughout.  No visible deformity.  Neurologic: CN 2-12 intact. Pain and light touch intact in extremities.  Symmetrical.  Speech is fluent. Motor exam as listed above. Psychiatric: Judgment intact, Mood & affect appropriate for pt's clinical situation. Dermatologic: No rashes or ulcers noted.  No changes consistent with cellulitis.   CBC Lab Results  Component Value Date   WBC 12.2 (H) 09/21/2015   HGB 11.9 (L) 09/21/2015   HCT 36.2 (L) 09/21/2015   MCV 89.3 09/21/2015   PLT 241 09/21/2015    BMET    Component Value Date/Time   NA 140 09/21/2015 0801   K 4.4 09/21/2015 0801   CL 104 09/21/2015 0801   CO2 30 09/21/2015 0801   GLUCOSE 108 (H) 09/21/2015 0801   BUN 19 07/21/2017 0809   CREATININE 0.99 07/21/2017 0809   CREATININE 1.03 09/18/2015 1601   CALCIUM  8.5 (L) 09/21/2015 0801   GFRNONAA >60 07/21/2017 0809   GFRAA >60 07/21/2017 0809   CrCl cannot be calculated (Patient's most recent lab result is older than the maximum 21 days allowed.).  COAG Lab Results  Component Value Date   INR 0.95 09/18/2015   INR 1.01 12/28/2012    Radiology No results found.   Assessment/Plan 1. Atherosclerosis of native artery of both lower extremities with intermittent claudication (Primary) Recommend:   The patient has evidence of atherosclerosis of the lower extremities with claudication.  The patient does not voice  lifestyle limiting changes at this point in time.  With respect to the irritation between his toes since the silicon spacers did not work I have recommended he try small tufts of lambswool placed between the toes to give some cushioning and some separation and this did seem to help.   Noninvasive studies do not suggest clinically significant change his study is pretty much the same as it was a couple years ago.   No invasive studies, angiography or surgery at this time The patient should continue walking and begin a more formal exercise program.  The patient should continue antiplatelet therapy and aggressive treatment of the lipid abnormalities   No changes in the patient's medications at this time he had significant side effects from cilostazol and did not find it to be particularly helpful or improve his lower extremity symptoms.  2. Thromboangiitis obliterans (Buerger's disease) Recommend:   The patient has evidence of atherosclerosis of the lower extremities with claudication.  The patient does not voice lifestyle limiting changes at this point in time.  With respect to the irritation between his toes since the silicon spacers did not work I have recommended he try small tufts of lambswool placed between the toes to give some cushioning and some separation and this did seem to help.   Noninvasive studies do not suggest clinically significant change his study is pretty much the same as it was a couple years ago.   No invasive studies, angiography or surgery at this time The patient should continue walking and begin a more formal exercise program.  The patient should continue antiplatelet therapy and aggressive treatment of the lipid abnormalities   No changes in the patient's medications at this time he had significant side effects from cilostazol and did not find it to be particularly helpful or improve his lower extremity symptoms.  3. Pure hypercholesterolemia Continue statin as  ordered and reviewed, no changes at this time    Cordella Shawl, MD  09/06/2024 3:33 PM

## 2024-09-09 ENCOUNTER — Other Ambulatory Visit (INDEPENDENT_AMBULATORY_CARE_PROVIDER_SITE_OTHER): Payer: Self-pay | Admitting: Nurse Practitioner

## 2024-09-09 ENCOUNTER — Telehealth (INDEPENDENT_AMBULATORY_CARE_PROVIDER_SITE_OTHER): Payer: Self-pay

## 2024-09-09 ENCOUNTER — Encounter (INDEPENDENT_AMBULATORY_CARE_PROVIDER_SITE_OTHER): Payer: Self-pay | Admitting: Nurse Practitioner

## 2024-09-09 ENCOUNTER — Ambulatory Visit (INDEPENDENT_AMBULATORY_CARE_PROVIDER_SITE_OTHER)

## 2024-09-09 ENCOUNTER — Ambulatory Visit (INDEPENDENT_AMBULATORY_CARE_PROVIDER_SITE_OTHER): Admitting: Nurse Practitioner

## 2024-09-09 VITALS — BP 147/93 | HR 58 | Resp 18 | Ht 72.0 in | Wt 199.8 lb

## 2024-09-09 DIAGNOSIS — E7800 Pure hypercholesterolemia, unspecified: Secondary | ICD-10-CM

## 2024-09-09 DIAGNOSIS — R200 Anesthesia of skin: Secondary | ICD-10-CM

## 2024-09-09 DIAGNOSIS — I70222 Atherosclerosis of native arteries of extremities with rest pain, left leg: Secondary | ICD-10-CM | POA: Diagnosis not present

## 2024-09-09 DIAGNOSIS — M79672 Pain in left foot: Secondary | ICD-10-CM

## 2024-09-09 MED ORDER — OXYCODONE HCL 5 MG PO TABS: 5.0000 mg | ORAL_TABLET | Freq: Four times a day (QID) | ORAL | 0 refills | Status: DC | PRN

## 2024-09-09 NOTE — Telephone Encounter (Signed)
 Call to nurse line with Patient complaint of left foot numb upon waking had extreme pain on yesterday that has continued until today, foot is cold and purple was unable to work on yesterday. Pain is present when moving and is a 8/10, she reports she would like him seen urgently as they are thinking he has a blockage again. Please advise

## 2024-09-09 NOTE — H&P (View-Only) (Signed)
 SUBJECTIVE:  Patient ID: Harold [name], male    DOB: 1981/01/25, 43 y.o.   MRN: [id number] Chief Complaint  Patient presents with   Follow-up    Discussed the use of AI scribe software for clinical note transcription with the patient, who gave verbal consent to proceed.  History of Present Illness Harold [name] is a 43 year old male who presents with acute onset of leg numbness and difficulty walking.  He experienced an acute onset of leg numbness and difficulty walking, waking up yesterday with an inability to walk more than ten yards without stopping due to discomfort. His foot and toes are numb, although he is able to move them. He notes a significant change in the color of his foot, describing it as purple, which is more severe than the usual discoloration he experiences.  He has a history of angiograms, with the last one performed in 2018. He recalls being informed of a blockage with no flow beyond a certain point in his leg several days ago. He is not in immediate severe pain but experiences discomfort primarily when walking more than ten yards, necessitating rest.  He works in a insurance claims handler, which involves a lot of walking. He describes a sensation in his foot as 'a strange sensation' akin to a 'little tiny flow'. He has not eaten today and is not allergic to any pain medications.  Noninvasive studies today revealed total occlusion of the distal SFA down to the tibial vessels.     Results    Past Medical History:  Diagnosis Date   Anxiety    Arthritis    Back pain    Peripheral vascular disease    Shingles     Past Surgical History:  Procedure Laterality Date   BYPASS GRAFT POPLITEAL TO POPLITEAL Left 09/20/2015   Procedure: BYPASS GRAFT POPLITEAL TO PERONEAL;  Surgeon: Cordella KANDICE Shawl, MD;  Location: [facility];  Service: Vascular;  Laterality: Left;   EMBOLECTOMY Left 09/20/2015   Procedure: EMBOLECTOMY;  Surgeon: Cordella KANDICE Shawl, MD;  Location: [facility];   Service: Vascular;  Laterality: Left;   LOWER EXTREMITY ANGIOGRAPHY Left 07/22/2017   Procedure: Lower Extremity Angiography;  Surgeon: Shawl Cordella KANDICE, MD;  Location: [facility];  Service: Cardiovascular;  Laterality: Left;   LOWER EXTREMITY INTERVENTION  07/22/2017   Procedure: LOWER EXTREMITY INTERVENTION;  Surgeon: Shawl Cordella KANDICE, MD;  Location: [facility];  Service: Cardiovascular;;   LUMBAR LAMINECTOMY/DECOMPRESSION MICRODISCECTOMY Right 12/29/2012   Procedure: SPINE: LAMINOTOMY DECOMPRESSION NERVE ROOT EXCISION HERNIATED DISK 1 SPACE LUMBAR;  Surgeon: Lacinda Ozell Mans, MD;  Location: [facility];  Service: Orthopedics;  Laterality: Right;  RIGHT L5-S1 DISC EXCISION   ORIF ANKLE FRACTURE Left 09/20/2015   Procedure: OPEN REDUCTION INTERNAL FIXATION (ORIF) ANKLE FRACTURE;  Surgeon: Ozell Flake, MD;  Location: [facility];  Service: Orthopedics;  Laterality: Left;   PERIPHERAL VASCULAR CATHETERIZATION Left 03/21/2015   Procedure: Lower Extremity Angiography;  Surgeon: Cordella KANDICE Shawl, MD;  Location: [facility];  Service: Cardiovascular;  Laterality: Left;   PERIPHERAL VASCULAR CATHETERIZATION  03/21/2015   Procedure: Lower Extremity Intervention;  Surgeon: Cordella KANDICE Shawl, MD;  Location: [facility];  Service: Cardiovascular;;   PERIPHERAL VASCULAR CATHETERIZATION Left 09/05/2015   Procedure: Lower Extremity Angiography;  Surgeon: Cordella KANDICE Shawl, MD;  Location: [facility];  Service: Cardiovascular;  Laterality: Left;   PERIPHERAL VASCULAR CATHETERIZATION  09/05/2015   Procedure: Lower Extremity Intervention;  Surgeon:  Cordella KANDICE Shawl, MD;  Location: [facility];  Service: Cardiovascular;;   PERIPHERAL VASCULAR CATHETERIZATION Left 08/27/2016   Procedure: Lower Extremity Angiography;  Surgeon: Cordella KANDICE Shawl, MD;  Location: [facility];  Service: Cardiovascular;  Laterality: Left;   TOOTH EXTRACTION     WISDOM TOOTH  EXTRACTION      Social History   Socioeconomic History   Marital status: Married    Spouse name: Not on file   Number of children: Not on file   Years of education: Not on file   Highest education level: Not on file  Occupational History   Not on file  Tobacco Use   Smoking status: Some Days    Current packs/day: 0.50    Average packs/day: 0.5 packs/day for 16.0 years (8.0 ttl pk-yrs)    Types: Cigarettes   Smokeless tobacco: Never  Vaping Use   Vaping status: Never Used  Substance and Sexual Activity   Alcohol use: No    Alcohol/week: 0.0 standard drinks of alcohol    Comment: rare   Drug use: No   Sexual activity: Not on file  Other Topics Concern   Not on file  Social History Narrative   Not on file   Social Drivers of Health   Financial Resource Strain: Low Risk  (03/18/2023)   Received from [facility]   Overall Financial Resource Strain (CARDIA)    Difficulty of Paying Living Expenses: Not hard at all  Food Insecurity: No Food Insecurity (03/18/2023)   Received from [facility]   Hunger Vital Sign    Within the past 12 months, you worried that your food would run out before you got the money to buy more.: Never true    Within the past 12 months, the food you bought just didn't last and you didn't have money to get more.: Never true  Transportation Needs: No Transportation Needs (03/18/2023)   Received from [facility] - [facility]    Lack of Transportation (Medical): No    Lack of Transportation (Non-Medical): No  Physical Activity: Not on file  Stress: Not on file  Social Connections: Unknown (03/04/2022)   Received from [facility]   Social Network    Social Network: Not on file  Intimate Partner Violence: Unknown (01/24/2022)   Received from [facility]   HITS    Physically Hurt: Not on file    Insult or Talk Down To: Not on file    Threaten Physical Harm: Not on file    Scream or Curse: Not on file    Family History  Problem  Relation Age of Onset   Cancer Mother    Diabetes Mother    Hypertension Mother    Hyperlipidemia Mother    Heart attack Father    Cancer Father     No Known Allergies   Review of Systems   Review of Systems: Negative Unless Checked Constitutional: [] Weight loss  [] Fever  [] Chills Cardiac: [] Chest pain   []  Atrial Fibrillation  [] Palpitations   [] Shortness of breath when laying flat   [] Shortness of breath with exertion. [] Shortness of breath at rest Vascular:  [x] Pain in legs with walking   [] Pain in legs with standing [] Pain in legs when laying flat   [] Claudication    [] Pain in feet when laying flat    [] History of DVT   [] Phlebitis   [] Swelling in legs   [] Varicose veins   [] Non-healing ulcers Pulmonary:   [] Uses home  oxygen   [] Productive cough   [] Hemoptysis   [] Wheeze  [] COPD   [] Asthma Neurologic:  [] Dizziness   [] Seizures  [] Blackouts [] History of stroke   [] History of TIA  [] Aphasia   [] Temporary Blindness   [] Weakness or numbness in arm   [] Weakness or numbness in leg Musculoskeletal:   [] Joint swelling   [] Joint pain   [] Low back pain  []  History of Knee Replacement [] Arthritis [] back Surgeries  []  Spinal Stenosis    Hematologic:  [] Easy bruising  [] Easy bleeding   [] Hypercoagulable state   [] Anemic Gastrointestinal:  [] Diarrhea   [] Vomiting  [] Gastroesophageal reflux/heartburn   [] Difficulty swallowing. [] Abdominal pain Genitourinary:  [] Chronic kidney disease   [] Difficult urination  [] Anuric   [] Blood in urine [] Frequent urination  [] Burning with urination   [] Hematuria Skin:  [] Rashes   [] Ulcers [] Wounds Psychological:  [] History of anxiety   []  History of major depression  []  Memory Difficulties      OBJECTIVE:     BP (!) 147/93   Pulse (!) 58   Resp 18   Ht 6' (1.829 m)   Wt 199 lb 12.8 oz (90.6 kg)   BMI 27.10 kg/m   Physical Exam Vitals reviewed.  HENT:     Head: Normocephalic.  Cardiovascular:     Rate and Rhythm: Normal rate.     Pulses:           Radial pulses are 0 on the left side.  Pulmonary:     Effort: Pulmonary effort is normal.  Skin:    General: Skin is cool and dry.     Coloration: Skin is cyanotic.     Comments:  L foot discoloration  Neurological:     Mental Status: He is alert and oriented to person, place, and time.     Motor: Weakness present.     Gait: Gait abnormal.  Psychiatric:        Mood and Affect: Mood normal.        Behavior: Behavior normal.        Thought Content: Thought content normal.        Judgment: Judgment normal.    Physical Exam     CMP     Component Value Date/Time   NA 140 09/21/2015 0801   K 4.4 09/21/2015 0801   CL 104 09/21/2015 0801   CO2 30 09/21/2015 0801   GLUCOSE 108 (H) 09/21/2015 0801   BUN 19 07/21/2017 0809   CREATININE 0.99 07/21/2017 0809   CREATININE 1.03 09/18/2015 1601   CALCIUM  8.5 (L) 09/21/2015 0801   PROT 7.0 09/18/2015 1601   ALBUMIN 4.1 09/18/2015 1601   AST 24 09/18/2015 1601   ALT 23 09/18/2015 1601   ALKPHOS 133 (H) 09/18/2015 1601   BILITOT 0.3 09/18/2015 1601   GFRNONAA >60 07/21/2017 0809    VAS US  ABI WITH/WO TBI Result Date: 07/23/2024  LOWER EXTREMITY DOPPLER STUDY Patient Name:  Harold [name]  Date of Exam:   07/22/2024 Medical Rec #: [id number]        Accession #:    [id number] Date of Birth: [birth year]        Patient Gender: M Patient Age:   16 years Exam Location:   [facility] Procedure:      VAS US  ABI WITH/WO TBI Referring Phys: Cordella Shawl --------------------------------------------------------------------------------  Indications: Claudication, and peripheral artery disease.  Comparison Study: 02/05/2023 Performing Technologist: Leafy Gibes RVS  Examination Guidelines: A complete evaluation includes at minimum, Doppler waveform signals and  systolic blood pressure reading at the level of bilateral brachial, anterior tibial, and posterior tibial arteries, when vessel segments are accessible. Bilateral testing is  considered an integral part of a complete examination. Photoelectric Plethysmograph (PPG) waveforms and toe systolic pressure readings are included as required and additional duplex testing as needed. Limited examinations for reoccurring indications may be performed as noted.  ABI Findings: +---------+------------------+-----+----------+--------+ Right    Rt Pressure (mmHg)IndexWaveform  Comment  +---------+------------------+-----+----------+--------+ Brachial 130                                       +---------+------------------+-----+----------+--------+ ATA      115               0.87 monophasic         +---------+------------------+-----+----------+--------+ PTA      163               1.23 biphasic           +---------+------------------+-----+----------+--------+ PERO     95                0.72 biphasic           +---------+------------------+-----+----------+--------+ Great Toe77                0.58 Abnormal           +---------+------------------+-----+----------+--------+ +---------+------------------+-----+----------+-------------+ Left     Lt Pressure (mmHg)IndexWaveform  Comment       +---------+------------------+-----+----------+-------------+ Brachial 132                                            +---------+------------------+-----+----------+-------------+ ATA      63                0.48 monophasic              +---------+------------------+-----+----------+-------------+ PTA      87                0.66 monophasic              +---------+------------------+-----+----------+-------------+ PERO     95                0.73 monophasic              +---------+------------------+-----+----------+-------------+ Great Toe                                 Great Toe Amp +---------+------------------+-----+----------+-------------+ +-------+-----------+-------------+------------+------------+ ABI/TBIToday's ABIToday's TBI  Previous  ABIPrevious TBI +-------+-----------+-------------+------------+------------+ Right  1.23       .58          1.20        .39          +-------+-----------+-------------+------------+------------+ Left   .73        Great Toe Amp.66                      +-------+-----------+-------------+------------+------------+  Right ABIs appear essentially unchanged compared to prior study on 02/05/2024. Left ABIs appear increased compared to prior study on 02/05/2023. Right TBIs appear to be increased compared to prior study on 02/05/2023.  Summary: Right: Resting right ankle-brachial index is within normal range. The right toe-brachial index is abnormal.  Left:  Resting left ankle-brachial index indicates moderate left lower extremity arterial disease.  *See table(s) above for measurements and observations.  Electronically signed by Cordella Shawl MD on 07/23/2024 at 9:35:40 AM.    Final        ASSESSMENT AND PLAN:  1. Critical limb ischemia of left lower extremity (HCC) (Primary) Acute arterial occlusion in the left lower extremity confirmed by studies, requiring immediate intervention. Explained the need for angiogram and emergency evaluation if symptoms worsen. - Scheduled angiogram for tomorrow with Dr. Marea.  The procedure, risk, benefits and alternatives were discussed with patient.  Patient is at high risk for limb loss. - Prescribed pain medication for home use. - Advised to keep foot on the floor to improve blood flow to prevent rest pain. - Instructed to go to the emergency room if pain becomes unbearable or if symptoms worsen. -Worsening symptoms include loss of motor function or significant pain.    2. Pure hypercholesterolemia Continue statin as ordered and reviewed, no changes at this time      Current Outpatient Medications on File Prior to Visit  Medication Sig Dispense Refill   aspirin  EC 81 MG tablet Take 81 mg by mouth daily. Swallow whole.     atorvastatin  (LIPITOR) 40 MG  tablet Take 40 mg by mouth daily.      clopidogrel  (PLAVIX ) 75 MG tablet Take 1 tablet (75 mg total) by mouth daily. 90 tablet 2   lidocaine -prilocaine  (EMLA ) cream Apply 1 Application topically every 12 (twelve) hours as needed. 30 g 4   No current facility-administered medications on file prior to visit.    There are no Patient Instructions on file for this visit. No follow-ups on file.   Inge Waldroup E Elva Mauro, NP  This note was completed with Office Manager.  Any errors are purely unintentional.

## 2024-09-09 NOTE — Progress Notes (Addendum)
 SUBJECTIVE:  Patient ID: Harold [name], male    DOB: 1981/01/25, 43 y.o.   MRN: [id number] Chief Complaint  Patient presents with   Follow-up    Discussed the use of AI scribe software for clinical note transcription with the patient, who gave verbal consent to proceed.  History of Present Illness Harold [name] is a 43 year old male who presents with acute onset of leg numbness and difficulty walking.  He experienced an acute onset of leg numbness and difficulty walking, waking up yesterday with an inability to walk more than ten yards without stopping due to discomfort. His foot and toes are numb, although he is able to move them. He notes a significant change in the color of his foot, describing it as purple, which is more severe than the usual discoloration he experiences.  He has a history of angiograms, with the last one performed in 2018. He recalls being informed of a blockage with no flow beyond a certain point in his leg several days ago. He is not in immediate severe pain but experiences discomfort primarily when walking more than ten yards, necessitating rest.  He works in a insurance claims handler, which involves a lot of walking. He describes a sensation in his foot as 'a strange sensation' akin to a 'little tiny flow'. He has not eaten today and is not allergic to any pain medications.  Noninvasive studies today revealed total occlusion of the distal SFA down to the tibial vessels.     Results    Past Medical History:  Diagnosis Date   Anxiety    Arthritis    Back pain    Peripheral vascular disease    Shingles     Past Surgical History:  Procedure Laterality Date   BYPASS GRAFT POPLITEAL TO POPLITEAL Left 09/20/2015   Procedure: BYPASS GRAFT POPLITEAL TO PERONEAL;  Surgeon: Cordella KANDICE Shawl, MD;  Location: [facility];  Service: Vascular;  Laterality: Left;   EMBOLECTOMY Left 09/20/2015   Procedure: EMBOLECTOMY;  Surgeon: Cordella KANDICE Shawl, MD;  Location: [facility];   Service: Vascular;  Laterality: Left;   LOWER EXTREMITY ANGIOGRAPHY Left 07/22/2017   Procedure: Lower Extremity Angiography;  Surgeon: Shawl Cordella KANDICE, MD;  Location: [facility];  Service: Cardiovascular;  Laterality: Left;   LOWER EXTREMITY INTERVENTION  07/22/2017   Procedure: LOWER EXTREMITY INTERVENTION;  Surgeon: Shawl Cordella KANDICE, MD;  Location: [facility];  Service: Cardiovascular;;   LUMBAR LAMINECTOMY/DECOMPRESSION MICRODISCECTOMY Right 12/29/2012   Procedure: SPINE: LAMINOTOMY DECOMPRESSION NERVE ROOT EXCISION HERNIATED DISK 1 SPACE LUMBAR;  Surgeon: Lacinda Ozell Mans, MD;  Location: [facility];  Service: Orthopedics;  Laterality: Right;  RIGHT L5-S1 DISC EXCISION   ORIF ANKLE FRACTURE Left 09/20/2015   Procedure: OPEN REDUCTION INTERNAL FIXATION (ORIF) ANKLE FRACTURE;  Surgeon: Ozell Flake, MD;  Location: [facility];  Service: Orthopedics;  Laterality: Left;   PERIPHERAL VASCULAR CATHETERIZATION Left 03/21/2015   Procedure: Lower Extremity Angiography;  Surgeon: Cordella KANDICE Shawl, MD;  Location: [facility];  Service: Cardiovascular;  Laterality: Left;   PERIPHERAL VASCULAR CATHETERIZATION  03/21/2015   Procedure: Lower Extremity Intervention;  Surgeon: Cordella KANDICE Shawl, MD;  Location: [facility];  Service: Cardiovascular;;   PERIPHERAL VASCULAR CATHETERIZATION Left 09/05/2015   Procedure: Lower Extremity Angiography;  Surgeon: Cordella KANDICE Shawl, MD;  Location: [facility];  Service: Cardiovascular;  Laterality: Left;   PERIPHERAL VASCULAR CATHETERIZATION  09/05/2015   Procedure: Lower Extremity Intervention;  Surgeon:  Cordella KANDICE Shawl, MD;  Location: [facility];  Service: Cardiovascular;;   PERIPHERAL VASCULAR CATHETERIZATION Left 08/27/2016   Procedure: Lower Extremity Angiography;  Surgeon: Cordella KANDICE Shawl, MD;  Location: [facility];  Service: Cardiovascular;  Laterality: Left;   TOOTH EXTRACTION     WISDOM TOOTH  EXTRACTION      Social History   Socioeconomic History   Marital status: Married    Spouse name: Not on file   Number of children: Not on file   Years of education: Not on file   Highest education level: Not on file  Occupational History   Not on file  Tobacco Use   Smoking status: Some Days    Current packs/day: 0.50    Average packs/day: 0.5 packs/day for 16.0 years (8.0 ttl pk-yrs)    Types: Cigarettes   Smokeless tobacco: Never  Vaping Use   Vaping status: Never Used  Substance and Sexual Activity   Alcohol use: No    Alcohol/week: 0.0 standard drinks of alcohol    Comment: rare   Drug use: No   Sexual activity: Not on file  Other Topics Concern   Not on file  Social History Narrative   Not on file   Social Drivers of Health   Financial Resource Strain: Low Risk  (03/18/2023)   Received from [facility]   Overall Financial Resource Strain (CARDIA)    Difficulty of Paying Living Expenses: Not hard at all  Food Insecurity: No Food Insecurity (03/18/2023)   Received from [facility]   Hunger Vital Sign    Within the past 12 months, you worried that your food would run out before you got the money to buy more.: Never true    Within the past 12 months, the food you bought just didn't last and you didn't have money to get more.: Never true  Transportation Needs: No Transportation Needs (03/18/2023)   Received from [facility]    Lack of Transportation (Medical): No    Lack of Transportation (Non-Medical): No  Physical Activity: Not on file  Stress: Not on file  Social Connections: Unknown (03/04/2022)   Received from [facility]   Social Network    Social Network: Not on file  Intimate Partner Violence: Unknown (01/24/2022)   Received from [facility]   HITS    Physically Hurt: Not on file    Insult or Talk Down To: Not on file    Threaten Physical Harm: Not on file    Scream or Curse: Not on file    Family History  Problem  Relation Age of Onset   Cancer Mother    Diabetes Mother    Hypertension Mother    Hyperlipidemia Mother    Heart attack Father    Cancer Father     No Known Allergies   Review of Systems   Review of Systems: Negative Unless Checked Constitutional: [] Weight loss  [] Fever  [] Chills Cardiac: [] Chest pain   []  Atrial Fibrillation  [] Palpitations   [] Shortness of breath when laying flat   [] Shortness of breath with exertion. [] Shortness of breath at rest Vascular:  [x] Pain in legs with walking   [] Pain in legs with standing [] Pain in legs when laying flat   [] Claudication    [] Pain in feet when laying flat    [] History of DVT   [] Phlebitis   [] Swelling in legs   [] Varicose veins   [] Non-healing ulcers Pulmonary:   [] Uses home  oxygen   [] Productive cough   [] Hemoptysis   [] Wheeze  [] COPD   [] Asthma Neurologic:  [] Dizziness   [] Seizures  [] Blackouts [] History of stroke   [] History of TIA  [] Aphasia   [] Temporary Blindness   [] Weakness or numbness in arm   [] Weakness or numbness in leg Musculoskeletal:   [] Joint swelling   [] Joint pain   [] Low back pain  []  History of Knee Replacement [] Arthritis [] back Surgeries  []  Spinal Stenosis    Hematologic:  [] Easy bruising  [] Easy bleeding   [] Hypercoagulable state   [] Anemic Gastrointestinal:  [] Diarrhea   [] Vomiting  [] Gastroesophageal reflux/heartburn   [] Difficulty swallowing. [] Abdominal pain Genitourinary:  [] Chronic kidney disease   [] Difficult urination  [] Anuric   [] Blood in urine [] Frequent urination  [] Burning with urination   [] Hematuria Skin:  [] Rashes   [] Ulcers [] Wounds Psychological:  [] History of anxiety   []  History of major depression  []  Memory Difficulties      OBJECTIVE:     BP (!) 147/93   Pulse (!) 58   Resp 18   Ht 6' (1.829 m)   Wt 199 lb 12.8 oz (90.6 kg)   BMI 27.10 kg/m   Physical Exam Vitals reviewed.  HENT:     Head: Normocephalic.  Cardiovascular:     Rate and Rhythm: Normal rate.     Pulses:           Radial pulses are 0 on the left side.  Pulmonary:     Effort: Pulmonary effort is normal.  Skin:    General: Skin is cool and dry.     Coloration: Skin is cyanotic.     Comments:  L foot discoloration  Neurological:     Mental Status: He is alert and oriented to person, place, and time.     Motor: Weakness present.     Gait: Gait abnormal.  Psychiatric:        Mood and Affect: Mood normal.        Behavior: Behavior normal.        Thought Content: Thought content normal.        Judgment: Judgment normal.    Physical Exam     CMP     Component Value Date/Time   NA 140 09/21/2015 0801   K 4.4 09/21/2015 0801   CL 104 09/21/2015 0801   CO2 30 09/21/2015 0801   GLUCOSE 108 (H) 09/21/2015 0801   BUN 19 07/21/2017 0809   CREATININE 0.99 07/21/2017 0809   CREATININE 1.03 09/18/2015 1601   CALCIUM  8.5 (L) 09/21/2015 0801   PROT 7.0 09/18/2015 1601   ALBUMIN 4.1 09/18/2015 1601   AST 24 09/18/2015 1601   ALT 23 09/18/2015 1601   ALKPHOS 133 (H) 09/18/2015 1601   BILITOT 0.3 09/18/2015 1601   GFRNONAA >60 07/21/2017 0809    VAS US  ABI WITH/WO TBI Result Date: 07/23/2024  LOWER EXTREMITY DOPPLER STUDY Patient Name:  Harold [name]  Date of Exam:   07/22/2024 Medical Rec #: [id number]        Accession #:    [id number] Date of Birth: [birth year]        Patient Gender: M Patient Age:   16 years [facility]am [facility] Procedure:      VAS US  ABI WITH/WO TBI Referring Phys: Cordella Shawl --------------------------------------------------------------------------------  Indications: Claudication, and peripheral artery disease.  Comparison Study: 02/05/2023 Performing Technologist: Leafy Gibes RVS  Examination Guidelines: A complete evaluation includes at minimum, Doppler waveform signals and  systolic blood pressure reading at the level of bilateral brachial, anterior tibial, and posterior tibial arteries, when vessel segments are accessible. Bilateral testing is  considered an integral part of a complete examination. Photoelectric Plethysmograph (PPG) waveforms and toe systolic pressure readings are included as required and additional duplex testing as needed. Limited examinations for reoccurring indications may be performed as noted.  ABI Findings: +---------+------------------+-----+----------+--------+ Right    Rt Pressure (mmHg)IndexWaveform  Comment  +---------+------------------+-----+----------+--------+ Brachial 130                                       +---------+------------------+-----+----------+--------+ ATA      115               0.87 monophasic         +---------+------------------+-----+----------+--------+ PTA      163               1.23 biphasic           +---------+------------------+-----+----------+--------+ PERO     95                0.72 biphasic           +---------+------------------+-----+----------+--------+ Great Toe77                0.58 Abnormal           +---------+------------------+-----+----------+--------+ +---------+------------------+-----+----------+-------------+ Left     Lt Pressure (mmHg)IndexWaveform  Comment       +---------+------------------+-----+----------+-------------+ Brachial 132                                            +---------+------------------+-----+----------+-------------+ ATA      63                0.48 monophasic              +---------+------------------+-----+----------+-------------+ PTA      87                0.66 monophasic              +---------+------------------+-----+----------+-------------+ PERO     95                0.73 monophasic              +---------+------------------+-----+----------+-------------+ Great Toe                                 Great Toe Amp +---------+------------------+-----+----------+-------------+ +-------+-----------+-------------+------------+------------+ ABI/TBIToday's ABIToday's TBI  Previous  ABIPrevious TBI +-------+-----------+-------------+------------+------------+ Right  1.23       .58          1.20        .39          +-------+-----------+-------------+------------+------------+ Left   .73        Great Toe Amp.66                      +-------+-----------+-------------+------------+------------+  Right ABIs appear essentially unchanged compared to prior study on 02/05/2024. Left ABIs appear increased compared to prior study on 02/05/2023. Right TBIs appear to be increased compared to prior study on 02/05/2023.  Summary: Right: Resting right ankle-brachial index is within normal range. The right toe-brachial index is abnormal.  Left:  Resting left ankle-brachial index indicates moderate left lower extremity arterial disease.  *See table(s) above for measurements and observations.  Electronically signed by Cordella Shawl MD on 07/23/2024 at 9:35:40 AM.    Final        ASSESSMENT AND PLAN:  1. Critical limb ischemia of left lower extremity (HCC) (Primary) Acute arterial occlusion in the left lower extremity confirmed by studies, requiring immediate intervention. Explained the need for angiogram and emergency evaluation if symptoms worsen. - Scheduled angiogram for tomorrow with Dr. Marea.  The procedure, risk, benefits and alternatives were discussed with patient.  Patient is at high risk for limb loss. - Prescribed pain medication for home use. - Advised to keep foot on the floor to improve blood flow to prevent rest pain. - Instructed to go to the emergency room if pain becomes unbearable or if symptoms worsen. -Worsening symptoms include loss of motor function or significant pain.    2. Pure hypercholesterolemia Continue statin as ordered and reviewed, no changes at this time      Current Outpatient Medications on File Prior to Visit  Medication Sig Dispense Refill   aspirin  EC 81 MG tablet Take 81 mg by mouth daily. Swallow whole.     atorvastatin  (LIPITOR) 40 MG  tablet Take 40 mg by mouth daily.      clopidogrel  (PLAVIX ) 75 MG tablet Take 1 tablet (75 mg total) by mouth daily. 90 tablet 2   lidocaine -prilocaine  (EMLA ) cream Apply 1 Application topically every 12 (twelve) hours as needed. 30 g 4   No current facility-administered medications on file prior to visit.    There are no Patient Instructions on file for this visit. No follow-ups on file.   Inge Waldroup E Elva Mauro, NP  This note was completed with Office Manager.  Any errors are purely unintentional.

## 2024-09-09 NOTE — Telephone Encounter (Signed)
 Spoke with the patient and he is scheduled with Dr. Marea for a LLE angio on 09/10/24 with a 11:30 am arrival time to the [facility]. Pre-procedure instructions were discussed and will be sent to Mychart.

## 2024-09-09 NOTE — Telephone Encounter (Signed)
 See if we can get him in this morning with ABIs

## 2024-09-10 ENCOUNTER — Ambulatory Visit
Admission: RE | Admit: 2024-09-10 | Discharge: 2024-09-10 | Disposition: A | Discharge status: Home / Self Care | Attending: Vascular Surgery | Admitting: Vascular Surgery

## 2024-09-10 ENCOUNTER — Other Ambulatory Visit: Payer: Self-pay

## 2024-09-10 ENCOUNTER — Ambulatory Visit

## 2024-09-10 ENCOUNTER — Encounter: Payer: Self-pay | Admitting: Vascular Surgery

## 2024-09-10 ENCOUNTER — Encounter: Admission: RE | Disposition: A | Payer: Self-pay | Source: Home / Self Care | Attending: Vascular Surgery

## 2024-09-10 DIAGNOSIS — T82856A Stenosis of peripheral vascular stent, initial encounter: Secondary | ICD-10-CM | POA: Diagnosis not present

## 2024-09-10 DIAGNOSIS — I731 Thromboangiitis obliterans [Buerger's disease]: Secondary | ICD-10-CM

## 2024-09-10 DIAGNOSIS — I70222 Atherosclerosis of native arteries of extremities with rest pain, left leg: Secondary | ICD-10-CM

## 2024-09-10 DIAGNOSIS — Z79899 Other long term (current) drug therapy: Secondary | ICD-10-CM | POA: Insufficient documentation

## 2024-09-10 DIAGNOSIS — R269 Unspecified abnormalities of gait and mobility: Secondary | ICD-10-CM | POA: Insufficient documentation

## 2024-09-10 DIAGNOSIS — F17210 Nicotine dependence, cigarettes, uncomplicated: Secondary | ICD-10-CM | POA: Insufficient documentation

## 2024-09-10 DIAGNOSIS — E7800 Pure hypercholesterolemia, unspecified: Secondary | ICD-10-CM | POA: Diagnosis not present

## 2024-09-10 HISTORY — PX: LOWER EXTREMITY ANGIOGRAPHY: CATH118251

## 2024-09-10 LAB — CREATININE, SERUM
Creatinine, Ser: 0.92 mg/dL (ref 0.61–1.24)
GFR, Estimated: >60 mL/min (ref >60)

## 2024-09-10 LAB — BUN: BUN: 13 mg/dL (ref 6–20)

## 2024-09-10 SURGERY — LOWER EXTREMITY ANGIOGRAPHY
Anesthesia: General | Site: Leg Lower | Laterality: Left

## 2024-09-10 MED ORDER — HYDROMORPHONE HCL 1 MG/ML IJ SOLN
0.2500 mg | INTRAMUSCULAR | Status: DC | PRN
  Administered 2024-09-10 (×2): 0.5 mg via INTRAVENOUS

## 2024-09-10 MED ORDER — LABETALOL HCL 5 MG/ML IV SOLN: 10.0000 mg | INTRAVENOUS | Status: DC | PRN

## 2024-09-10 MED ORDER — OXYCODONE HCL 5 MG PO TABS: 5.0000 mg | ORAL_TABLET | Freq: Once | ORAL | Status: DC | PRN

## 2024-09-10 MED ORDER — HEPARIN SODIUM (PORCINE) 1000 UNIT/ML IJ SOLN
INTRAMUSCULAR | Status: DC | PRN
  Administered 2024-09-10: 5000 [IU] via INTRAVENOUS

## 2024-09-10 MED ORDER — KETAMINE HCL 50 MG/5ML IJ SOSY
PREFILLED_SYRINGE | INTRAMUSCULAR | Status: DC | PRN
  Administered 2024-09-10: 30 mg via INTRAVENOUS
  Administered 2024-09-10 (×2): 10 mg via INTRAVENOUS

## 2024-09-10 MED ORDER — HYDRALAZINE HCL 20 MG/ML IJ SOLN: 5.0000 mg | INTRAMUSCULAR | Status: DC | PRN

## 2024-09-10 MED ORDER — FAMOTIDINE 20 MG PO TABS: 40.0000 mg | ORAL_TABLET | Freq: Once | ORAL | Status: DC | PRN

## 2024-09-10 MED ORDER — HYDROMORPHONE HCL 1 MG/ML IJ SOLN
INTRAMUSCULAR | Status: AC
  Filled 2024-09-10: qty 1

## 2024-09-10 MED ORDER — ACETAMINOPHEN 325 MG PO TABS: 650.0000 mg | ORAL_TABLET | ORAL | Status: DC | PRN

## 2024-09-10 MED ORDER — SODIUM CHLORIDE 0.9 % IV SOLN: INTRAVENOUS | Status: DC

## 2024-09-10 MED ORDER — HYDROMORPHONE HCL 1 MG/ML IJ SOLN
1.0000 mg | Freq: Once | INTRAMUSCULAR | Status: AC | PRN
  Administered 2024-09-10: 1 mg via INTRAVENOUS

## 2024-09-10 MED ORDER — PROPOFOL 10 MG/ML IV BOLUS
INTRAVENOUS | Status: AC
  Filled 2024-09-10: qty 20

## 2024-09-10 MED ORDER — LIDOCAINE-EPINEPHRINE (PF) 1 %-1:200000 IJ SOLN
INTRAMUSCULAR | Status: DC | PRN
  Administered 2024-09-10: 10 mL via INTRADERMAL

## 2024-09-10 MED ORDER — KETAMINE HCL 50 MG/5ML IJ SOSY
PREFILLED_SYRINGE | INTRAMUSCULAR | Status: AC
Start: 2024-09-10 — End: 2024-09-10
  Filled 2024-09-10: qty 5

## 2024-09-10 MED ORDER — ROCURONIUM BROMIDE 10 MG/ML (PF) SYRINGE
PREFILLED_SYRINGE | INTRAVENOUS | Status: AC
  Filled 2024-09-10: qty 10

## 2024-09-10 MED ORDER — HEPARIN SODIUM (PORCINE) 1000 UNIT/ML IJ SOLN
INTRAMUSCULAR | Status: AC
  Filled 2024-09-10: qty 10

## 2024-09-10 MED ORDER — DEXMEDETOMIDINE HCL IN NACL 80 MCG/20ML IV SOLN
INTRAVENOUS | Status: AC
  Filled 2024-09-10: qty 20

## 2024-09-10 MED ORDER — PROPOFOL 10 MG/ML IV BOLUS
INTRAVENOUS | Status: AC
Start: 2024-09-10 — End: 2024-09-10
  Filled 2024-09-10: qty 20

## 2024-09-10 MED ORDER — LACTATED RINGERS IV SOLN: INTRAVENOUS | Status: DC | PRN

## 2024-09-10 MED ORDER — HEPARIN (PORCINE) IN NACL 1000-0.9 UT/500ML-% IV SOLN
INTRAVENOUS | Status: DC | PRN
  Administered 2024-09-10: 1000 mL

## 2024-09-10 MED ORDER — MIDAZOLAM HCL 2 MG/2ML IJ SOLN
INTRAMUSCULAR | Status: AC
  Filled 2024-09-10: qty 2

## 2024-09-10 MED ORDER — DEXAMETHASONE SOD PHOSPHATE PF 10 MG/ML IJ SOLN
INTRAMUSCULAR | Status: DC | PRN
  Administered 2024-09-10: 10 mg via INTRAVENOUS

## 2024-09-10 MED ORDER — PROPOFOL 10 MG/ML IV BOLUS
INTRAVENOUS | Status: DC | PRN
  Administered 2024-09-10: 10 mg via INTRAVENOUS
  Administered 2024-09-10: 20 mg via INTRAVENOUS
  Administered 2024-09-10: 200 mg via INTRAVENOUS
  Administered 2024-09-10: 40 mg via INTRAVENOUS
  Administered 2024-09-10: 20 mg via INTRAVENOUS

## 2024-09-10 MED ORDER — METHYLPREDNISOLONE SODIUM SUCC 125 MG IJ SOLR: 125.0000 mg | Freq: Once | INTRAMUSCULAR | Status: DC | PRN

## 2024-09-10 MED ORDER — SODIUM CHLORIDE 0.9 % IV SOLN
250.0000 mL | INTRAVENOUS | Status: DC | PRN
Start: 2024-09-10 — End: 2024-09-10

## 2024-09-10 MED ORDER — EPHEDRINE SULFATE-NACL 50-0.9 MG/10ML-% IV SOSY
PREFILLED_SYRINGE | INTRAVENOUS | Status: DC | PRN
  Administered 2024-09-10 (×4): 5 mg via INTRAVENOUS

## 2024-09-10 MED ORDER — IODIXANOL 320 MG/ML IV SOLN
INTRAVENOUS | Status: DC | PRN
Start: 2024-09-10 — End: 2024-09-10
  Administered 2024-09-10: 80 mL via INTRA_ARTERIAL

## 2024-09-10 MED ORDER — MIDAZOLAM HCL 2 MG/ML PO SYRP: 8.0000 mg | ORAL_SOLUTION | Freq: Once | ORAL | Status: DC | PRN

## 2024-09-10 MED ORDER — FENTANYL CITRATE (PF) 100 MCG/2ML IJ SOLN
INTRAMUSCULAR | Status: DC | PRN
  Administered 2024-09-10 (×2): 50 ug via INTRAVENOUS

## 2024-09-10 MED ORDER — GLYCOPYRROLATE 0.2 MG/ML IJ SOLN
INTRAMUSCULAR | Status: DC | PRN
  Administered 2024-09-10: .2 mg via INTRAVENOUS

## 2024-09-10 MED ORDER — FENTANYL CITRATE (PF) 100 MCG/2ML IJ SOLN
INTRAMUSCULAR | Status: AC
  Filled 2024-09-10: qty 2

## 2024-09-10 MED ORDER — DEXMEDETOMIDINE HCL IN NACL 80 MCG/20ML IV SOLN
INTRAVENOUS | Status: DC | PRN
  Administered 2024-09-10 (×2): 8 ug via INTRAVENOUS
  Administered 2024-09-10: 4 ug via INTRAVENOUS

## 2024-09-10 MED ORDER — MIDAZOLAM HCL (PF) 2 MG/2ML IJ SOLN
INTRAMUSCULAR | Status: DC | PRN
  Administered 2024-09-10: 2 mg via INTRAVENOUS

## 2024-09-10 MED ORDER — ONDANSETRON HCL 4 MG/2ML IJ SOLN
INTRAMUSCULAR | Status: AC
  Filled 2024-09-10: qty 2

## 2024-09-10 MED ORDER — LIDOCAINE HCL (PF) 2 % IJ SOLN
INTRAMUSCULAR | Status: AC
  Filled 2024-09-10: qty 5

## 2024-09-10 MED ORDER — ONDANSETRON HCL 4 MG/2ML IJ SOLN: 4.0000 mg | Freq: Four times a day (QID) | INTRAMUSCULAR | Status: DC | PRN

## 2024-09-10 MED ORDER — DIPHENHYDRAMINE HCL 50 MG/ML IJ SOLN: 50.0000 mg | Freq: Once | INTRAMUSCULAR | Status: DC | PRN

## 2024-09-10 MED ORDER — SODIUM CHLORIDE 0.9% FLUSH: 3.0000 mL | INTRAVENOUS | Status: DC | PRN

## 2024-09-10 MED ORDER — SODIUM CHLORIDE 0.9% FLUSH: 3.0000 mL | Freq: Two times a day (BID) | INTRAVENOUS | Status: DC

## 2024-09-10 MED ORDER — PHENYLEPHRINE 80 MCG/ML (10ML) SYRINGE FOR IV PUSH (FOR BLOOD PRESSURE SUPPORT)
PREFILLED_SYRINGE | INTRAVENOUS | Status: DC | PRN
  Administered 2024-09-10 (×3): 80 ug via INTRAVENOUS
  Administered 2024-09-10 (×3): 160 ug via INTRAVENOUS

## 2024-09-10 MED ORDER — CEFAZOLIN SODIUM-DEXTROSE 2-4 GM/100ML-% IV SOLN
2.0000 g | INTRAVENOUS | Status: AC
  Administered 2024-09-10: 2 g via INTRAVENOUS

## 2024-09-10 MED ORDER — ONDANSETRON HCL 4 MG/2ML IJ SOLN
INTRAMUSCULAR | Status: DC | PRN
  Administered 2024-09-10: 4 mg via INTRAVENOUS

## 2024-09-10 MED ORDER — CEFAZOLIN SODIUM-DEXTROSE 2-4 GM/100ML-% IV SOLN
INTRAVENOUS | Status: AC
Start: 2024-09-10 — End: 2024-09-10
  Filled 2024-09-10: qty 100

## 2024-09-10 MED ORDER — LIDOCAINE HCL (CARDIAC) PF 100 MG/5ML IV SOSY
PREFILLED_SYRINGE | INTRAVENOUS | Status: DC | PRN
  Administered 2024-09-10: 100 mg via INTRAVENOUS

## 2024-09-10 MED ORDER — OXYCODONE HCL 5 MG/5ML PO SOLN
5.0000 mg | Freq: Once | ORAL | Status: DC | PRN
  Filled 2024-09-10: qty 5

## 2024-09-10 SURGICAL SUPPLY — 20 items
BALLOON LUTONIX 018 4X220X130 (BALLOONS) IMPLANT
BALLOON LUTONIX 018 5X150X130 (BALLOONS) IMPLANT
BALLOON ULTRVS 018 2.5X100X150 (BALLOONS) IMPLANT
CATH ANGIO 5F PIGTAIL 65CM (CATHETERS) IMPLANT
CATH BEACON 5 .038 100 VERT TP (CATHETERS) IMPLANT
CATH CXI SUPP 2.6F 150 ANG (CATHETERS) IMPLANT
CATH CXI SUPP ANG 4FR 135 (CATHETERS) IMPLANT
COVER PROBE ULTRASOUND 5X96 (MISCELLANEOUS) IMPLANT
DEVICE PRESTO INFLATION (MISCELLANEOUS) IMPLANT
DEVICE STARCLOSE SE CLOSURE (Vascular Products) IMPLANT
GLIDEWIRE ADV .035X260CM (WIRE) IMPLANT
GUIDEWIRE PFTE-COATED .018X300 (WIRE) IMPLANT
PACK ANGIOGRAPHY (CUSTOM PROCEDURE TRAY) ×1 IMPLANT
SHEATH ANL2 6FRX45 HC (SHEATH) IMPLANT
SHEATH BRITE TIP 5FRX11 (SHEATH) IMPLANT
SYR MEDRAD MARK 7 150ML (SYRINGE) IMPLANT
TUBING CONTRAST HIGH PRESS 72 (TUBING) IMPLANT
WIRE COMMAND ST STR 014 300 (WIRE) IMPLANT
WIRE G V18X300CM (WIRE) IMPLANT
WIRE J 3MM .035X145CM (WIRE) IMPLANT

## 2024-09-10 NOTE — Anesthesia Postprocedure Evaluation (Signed)
 Anesthesia Post Note  Patient: Harold [name]  Procedure(s) Performed: Lower Extremity Angiography (Left: Leg Lower)  Patient location during evaluation: PACU Anesthesia Type: General Level of consciousness: awake and alert Pain management: pain level controlled Vital Signs Assessment: post-procedure vital signs reviewed and stable Respiratory status: spontaneous breathing, nonlabored ventilation, respiratory function stable and patient connected to nasal cannula oxygen Cardiovascular status: blood pressure returned to baseline and stable Postop Assessment: no apparent nausea or vomiting Anesthetic complications: no   No notable events documented.   Last Vitals:  Vitals:   09/10/24 1145 09/10/24 1416  BP: (!) 153/103 136/84  Pulse: (!) 56 81  Resp: 12   Temp: 36.6 C (!) 36.2 C  SpO2: 98% 100%    Last Pain:  Vitals:   09/10/24 1432  TempSrc:   PainSc: 10-Worst pain ever                 Shau-Shau Melia

## 2024-09-10 NOTE — Op Note (Signed)
 [facility]  Percutaneous Study/Intervention Procedural Note   Date of Surgery: 09/10/2024  Surgeon(s):Calah Gershman    Assistants:none  Pre-operative Diagnosis: PAD with claudication LLE  Post-operative diagnosis:  Same  Procedure(s) Performed:             1.  Ultrasound guidance for vascular access right femoral artery             2.  Catheter placement into left common femoral artery from right femoral approach             3.  Aortogram and selective left lower extremity angiogram             4.  Percutaneous transluminal angioplasty of left anterior tibial artery with 2.5 mm diameter by 15 cm length angioplasty balloon             5.  Percutaneous transluminal angioplasty of the left popliteal artery with 4 mm diameter by 22 cm length Lutonix drug-coated angioplasty balloon  6.  Percutaneous transluminal angioplasty of the left distal SFA and popliteal artery with 5 mm diameter by 15 cm length Lutonix drug-coated angioplasty balloon             7.  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 The patient has noninvasive study showing essentially no flow from the mid to distal SFA distally.  He has disabling claudication symptoms and some degree of early rest pain. The patient is brought in for angiography for further evaluation and potential treatment.  Due to the limb threatening nature of the situation, angiogram was performed for attempted limb salvage. The patient is aware that if the procedure fails, amputation would be expected.  The patient also understands that even with successful revascularization, amputation may still be required due to the severity of the situation.  Risks and benefits are discussed and informed consent is obtained.    Procedure:  The patient was identified and appropriate procedural time out was performed.  The patient was then placed supine on the table and prepped and draped in the usual sterile fashion. Moderate conscious sedation was administered during a face to face encounter with the patient throughout the procedure with my supervision of the RN administering medicines and monitoring the patient's vital signs, pulse oximetry, telemetry and mental status throughout from the start of the procedure until the patient was taken to the recovery room. Ultrasound was used to evaluate the right common femoral artery.  It was patent .  A digital ultrasound image was acquired.  A Seldinger needle was used to access the right common femoral artery under direct ultrasound guidance and a permanent image was performed.  A 0.035 J wire was advanced without resistance and a 5Fr sheath was placed.  Pigtail catheter was placed into the aorta and an AP aortogram was performed. This demonstrated normal renal arteries and normal aorta and iliac segments without significant stenosis. I then crossed the aortic bifurcation and advanced to the left femoral head. Selective left lower extremity angiogram was then performed. This demonstrated fairly normal left common femoral artery, profunda femoris artery, and proximal to mid superficial femoral artery.  There was an abrupt occlusion in the distal superficial femoral artery.  The previously placed stent in the popliteal artery was occluded as was the entirety of the popliteal artery.  There was faint reconstitution of the anterior tibial artery proximally but then again occluded and there was essentially no runoff distally.  All the previous stents and the popliteal to peroneal bypass were all occluded.  There was no apparent peroneal artery or posterior tibial artery at all.  The anterior tibial artery only had some flow proximally.  There were large collaterals with corkscrew appearance  consistent with his previous history of Buerger's disease.  There was essentially no flow into the foot. It was felt that it was in the patient's best interest to proceed with intervention after these images to avoid a second procedure and a larger amount of contrast and fluoroscopy based off of the findings from the initial angiogram. The patient was systemically heparinized and a 6 French Ansell sheath was then placed over the [employer] wire. I then used a Kumpe catheter and the advantage wire to get down into the SFA and popliteal occlusion.  I was able to get an advantage wire and then a CXI catheter into the anterior tibial artery proximally.  Imaging in the anterior tibial artery showed a large collateral that was actually larger than the main anterior tibial artery.  The proximal portion of the anterior tibial artery had an occlusion.  The anterior tibial artery was occluded.  There were reconstitution of several small collaterals in the foot and ankle but nothing that looked like a true dominant anterior tibial artery and nothing larger than about a millimeter.  I then attempted to continue down the anterior tibial artery and see if I can get into a negative anterior tibial artery distally to try to revascularize in the foot.  Multiple attempts were made with multiple different wires including command wire, 0.018 advantage wires, 0.035 and 0.018 CXI catheters.  I could never get back into a true lumen distally and it was clear this was going to be a futile endeavor.  I did not want to put any stents that could harm any collaterals and potentially lower his risk of healing a below-knee amputation but I did feel it would be worthwhile to try to angioplasty the SFA and popliteal arteries as well as the proximal anterior tibial artery to improve collateral blood flow distally.  A 2.5 mm diameter by 15 cm length angioplasty balloon was inflated in the proximal anterior tibial artery and taken up to 10 atm  for 1 minute.  A 4 mm diameter by 22 cm length Lutonix drug-coated angioplasty balloon was inflated to 14 atm throughout the popliteal artery and then a 5 mm diameter by 15 cm length Lutonix drug-coated angioplasty balloon was used in the distal SFA and most proximal popliteal artery and inflated to 10 atm for 1 minute.  Completion imaging showed really no significant improvement with continued occlusion through the popliteal artery and the proximal anterior tibial artery.  Again, without a distal target I did not feel that trying to put stents in that would potentially harm collateral blood flow would be of any benefit.  There was really not a distal target for another bypass surgery.  There was not much more we could do today. I elected to terminate the procedure. The sheath was removed and StarClose closure device was deployed in the right femoral artery with excellent hemostatic result. The patient was taken to the recovery room in stable condition having tolerated the procedure well.  Findings:  Aortogram:  This demonstrated normal renal arteries and normal aorta and iliac segments without significant stenosis.             Left lower Extremity:  This demonstrated fairly normal left common femoral artery, profunda femoris artery, and proximal to mid superficial femoral artery.  There was an abrupt occlusion in the distal superficial femoral artery.  The previously placed stent in the popliteal artery was occluded as was the entirety of the popliteal artery.  There was faint reconstitution of the anterior tibial artery proximally but then again occluded and there was essentially no runoff distally.  All the previous stents and the popliteal to peroneal bypass were all occluded.  There was no apparent peroneal artery or posterior tibial artery at all.  The anterior tibial artery only had some flow proximally.  There were large collaterals with corkscrew appearance consistent with his previous  history of Buerger's disease.  There was essentially no flow into the foot.   Disposition: Patient was taken to the recovery room in stable condition having tolerated the procedure well.  Complications: None  Selinda Gu 09/10/2024 1:54 PM   This note was created with Dragon Medical transcription system. Any errors in dictation are purely unintentional.

## 2024-09-10 NOTE — Interval H&P Note (Signed)
 History and Physical Interval Note:  09/10/2024 11:08 AM  Harold [name]  has presented today for surgery, with the diagnosis of LLE Angio    Critical limb ischemia.  The various methods of treatment have been discussed with the patient and family. After consideration of risks, benefits and other options for treatment, the patient has consented to  Procedure(s): Lower Extremity Angiography (Left) as a surgical intervention.  The patient's history has been reviewed, patient examined, no change in status, stable for surgery.  I have reviewed the patient's chart and labs.  Questions were answered to the patient's satisfaction.     Drayton Tieu

## 2024-09-10 NOTE — Progress Notes (Signed)
 Dr. Marea in at bedside, speaking with pt. And his wife re: procedural results. Both pt. And wife verbalized understanding of conversation with MD.

## 2024-09-10 NOTE — Anesthesia Procedure Notes (Signed)
 Procedure Name: LMA Insertion Date/Time: 09/10/2024 12:33 PM  Performed by: Carley Pac, RNPre-anesthesia Checklist: Patient identified, Patient being monitored, Timeout performed, Emergency Drugs available and Suction available Patient Re-evaluated:Patient Re-evaluated prior to induction Oxygen Delivery Method: Circle system utilized Preoxygenation: Pre-oxygenation with 100% oxygen Induction Type: IV induction Ventilation: Mask ventilation without difficulty LMA: LMA with gastric port inserted and LMA inserted LMA Size: 4.0 Tube type: Oral Number of attempts: 1 Placement Confirmation: positive ETCO2 and breath sounds checked- equal and bilateral Tube secured with: Tape Dental Injury: Teeth and Oropharynx as per pre-operative assessment

## 2024-09-10 NOTE — Anesthesia Preprocedure Evaluation (Signed)
 Anesthesia Evaluation  Patient identified by MRN, date of birth, ID band Patient awake    Reviewed: Allergy & Precautions, NPO status , Patient's Chart, lab work & pertinent test results  Airway Mallampati: I  TM Distance: >3 FB Neck ROM: Full Positive for:  Tracheal deviation   Dental   Pulmonary former smoker   Pulmonary exam normal        Cardiovascular Exercise Tolerance: Good + Peripheral Vascular Disease  Normal cardiovascular exam     Neuro/Psych   Anxiety        GI/Hepatic   Endo/Other    Renal/GU      Musculoskeletal   Abdominal Normal abdominal exam  (+)   Peds  Hematology   Anesthesia Other Findings   Reproductive/Obstetrics                              Anesthesia Physical Anesthesia Plan  ASA: 3  Anesthesia Plan: General   Post-op Pain Management:    Induction: Intravenous  PONV Risk Score and Plan:   Airway Management Planned: LMA  Additional Equipment:   Intra-op Plan:   Post-operative Plan: Extubation in OR  Informed Consent: I have reviewed the patients History and Physical, chart, labs and discussed the procedure including the risks, benefits and alternatives for the proposed anesthesia with the patient or authorized representative who has indicated his/her understanding and acceptance.       Plan Discussed with: CRNA  Anesthesia Plan Comments:          Anesthesia Quick Evaluation

## 2024-09-10 NOTE — Transfer of Care (Cosign Needed)
 Immediate Anesthesia Transfer of Care Note  Patient: Harold [name]  Procedure(s) Performed: Lower Extremity Angiography (Left: Leg Lower)  Patient Location: PACU  Anesthesia Type:General  Level of Consciousness: drowsy and responds to stimulation  Airway & Oxygen Therapy: Patient Spontanous Breathing and Patient connected to face mask  Post-op Assessment: Report given to RN, Post -op Vital signs reviewed and stable, and Patient moving all extremities X 4  Post vital signs: Reviewed and stable  Last Vitals:  Vitals Value Taken Time  BP 136/84 09/10/24 14:16  Temp 36.2 C 09/10/24 14:16  Pulse 81 09/10/24 14:16  Resp    SpO2 100 % 09/10/24 14:16    Last Pain:  Vitals:   09/10/24 1416  TempSrc: Tympanic  PainSc:          Complications: No notable events documented.

## 2024-09-13 ENCOUNTER — Encounter: Payer: Self-pay | Admitting: Vascular Surgery

## 2024-09-13 ENCOUNTER — Telehealth (INDEPENDENT_AMBULATORY_CARE_PROVIDER_SITE_OTHER): Payer: Self-pay

## 2024-09-13 NOTE — Telephone Encounter (Signed)
 Patients wife called at this time in reference to a conversation that her and her husband had with Dr. Marea in reference to bad news from his left leg angiogram, wife stated patient is ready to move forward with L leg BKA at this time and is ready to get the process started. She stated her husband is having a hard time sleeping at this time due to the pain and he is just having a lot of difficulty getting comfortable. She stated he is going to need his pain medication as he is up to 3 pills a day and only has 11 pills left to get through the holiday.

## 2024-09-14 ENCOUNTER — Other Ambulatory Visit (INDEPENDENT_AMBULATORY_CARE_PROVIDER_SITE_OTHER): Payer: Self-pay | Admitting: Nurse Practitioner

## 2024-09-14 MED ORDER — OXYCODONE HCL 5 MG PO TABS: 5.0000 mg | ORAL_TABLET | Freq: Four times a day (QID) | ORAL | 0 refills | Status: DC | PRN

## 2024-09-14 NOTE — Telephone Encounter (Signed)
 Leita lets get him with [facility] and the meds are sent

## 2024-09-15 ENCOUNTER — Telehealth (INDEPENDENT_AMBULATORY_CARE_PROVIDER_SITE_OTHER): Payer: Self-pay

## 2024-09-15 NOTE — Telephone Encounter (Signed)
 Called and made patients wife aware that her husbands pain medication has been sent to the pharmacy at this time.

## 2024-09-15 NOTE — Telephone Encounter (Signed)
 Spoke with the patient and he has been scheduled with Dr. Jama for a left leg amputation on 09/22/24 at the [facility]. Pre-admit will call to schedule pre-op at the [facility]. Pre-surgical instructions were discussed and will be sent to Mychart and mailed.

## 2024-09-17 ENCOUNTER — Inpatient Hospital Stay
Admission: EM | Admit: 2024-09-17 | Discharge: 2024-09-25 | DRG: 240 | Disposition: A | Attending: Internal Medicine | Admitting: Internal Medicine

## 2024-09-17 ENCOUNTER — Other Ambulatory Visit: Payer: Self-pay

## 2024-09-17 DIAGNOSIS — Z89512 Acquired absence of left leg below knee: Secondary | ICD-10-CM

## 2024-09-17 DIAGNOSIS — L03116 Cellulitis of left lower limb: Secondary | ICD-10-CM

## 2024-09-17 DIAGNOSIS — I998 Other disorder of circulatory system: Principal | ICD-10-CM

## 2024-09-17 DIAGNOSIS — R52 Pain, unspecified: Secondary | ICD-10-CM

## 2024-09-17 DIAGNOSIS — I731 Thromboangiitis obliterans [Buerger's disease]: Secondary | ICD-10-CM | POA: Diagnosis present

## 2024-09-17 DIAGNOSIS — G894 Chronic pain syndrome: Secondary | ICD-10-CM | POA: Diagnosis present

## 2024-09-17 DIAGNOSIS — I70222 Atherosclerosis of native arteries of extremities with rest pain, left leg: Secondary | ICD-10-CM | POA: Diagnosis present

## 2024-09-17 LAB — PROTIME-INR
INR: 1 (ref 0.8–1.2)
Prothrombin Time: 13.6 s (ref 11.4–15.2)

## 2024-09-17 LAB — COMPREHENSIVE METABOLIC PANEL WITH GFR
ALT: 39 U/L (ref 0–44)
AST: 27 U/L (ref 15–41)
Albumin: 4.2 g/dL (ref 3.5–5.0)
Alkaline Phosphatase: 173 U/L — ABNORMAL HIGH (ref 38–126)
Anion gap: 8 (ref 5–15)
BUN: 20 mg/dL (ref 6–20)
CO2: 29 mmol/L (ref 22–32)
Calcium: 9.7 mg/dL (ref 8.9–10.3)
Chloride: 102 mmol/L (ref 98–111)
Creatinine, Ser: 1.12 mg/dL (ref 0.61–1.24)
GFR, Estimated: >60 mL/min (ref >60)
Glucose, Bld: 93 mg/dL (ref 70–99)
Potassium: 4.2 mmol/L (ref 3.5–5.1)
Sodium: 139 mmol/L (ref 135–145)
Total Bilirubin: 0.4 mg/dL (ref 0.0–1.2)
Total Protein: 7.5 g/dL (ref 6.5–8.1)

## 2024-09-17 LAB — CBC WITH DIFFERENTIAL/PLATELET
Abs Immature Granulocytes: 0.02 K/uL (ref 0.00–0.07)
Basophils Absolute: 0.1 K/uL (ref 0.0–0.1)
Basophils Relative: 1 %
Eosinophils Absolute: 0.2 K/uL (ref 0.0–0.5)
Eosinophils Relative: 2 %
HCT: 42.9 % (ref 39.0–52.0)
Hemoglobin: 14.2 g/dL (ref 13.0–17.0)
Immature Granulocytes: 0 %
Lymphocytes Relative: 19 %
Lymphs Abs: 1.6 K/uL (ref 0.7–4.0)
MCH: 29.6 pg (ref 26.0–34.0)
MCHC: 33.1 g/dL (ref 30.0–36.0)
MCV: 89.4 fL (ref 80.0–100.0)
Monocytes Absolute: 0.8 K/uL (ref 0.1–1.0)
Monocytes Relative: 9 %
Neutro Abs: 5.7 K/uL (ref 1.7–7.7)
Neutrophils Relative %: 69 %
Platelets: 254 K/uL (ref 150–400)
RBC: 4.8 MIL/uL (ref 4.22–5.81)
RDW: 13.3 % (ref 11.5–15.5)
WBC: 8.3 K/uL (ref 4.0–10.5)
nRBC: 0 % (ref 0.0–0.2)

## 2024-09-17 LAB — APTT: aPTT: 32 s (ref 24–36)

## 2024-09-17 LAB — LACTIC ACID, PLASMA: Lactic Acid, Venous: 0.8 mmol/L (ref 0.5–1.9)

## 2024-09-17 MED ORDER — ONDANSETRON HCL 4 MG/2ML IJ SOLN
4.0000 mg | INTRAMUSCULAR | Status: AC
  Administered 2024-09-18: 4 mg via INTRAVENOUS
  Filled 2024-09-17: qty 2

## 2024-09-17 MED ORDER — VANCOMYCIN HCL IN DEXTROSE 1-5 GM/200ML-% IV SOLN
1000.0000 mg | Freq: Once | INTRAVENOUS | Status: DC
  Filled 2024-09-17: qty 200

## 2024-09-17 MED ORDER — MORPHINE SULFATE (PF) 4 MG/ML IV SOLN
4.0000 mg | Freq: Once | INTRAVENOUS | Status: AC
  Administered 2024-09-18: 4 mg via INTRAVENOUS
  Filled 2024-09-17: qty 1

## 2024-09-17 MED ORDER — SODIUM CHLORIDE 0.9 % IV SOLN
2.0000 g | Freq: Once | INTRAVENOUS | Status: AC
  Administered 2024-09-18: 2 g via INTRAVENOUS
  Filled 2024-09-17: qty 12.5

## 2024-09-17 NOTE — Telephone Encounter (Signed)
 Spoke with wife via phone.  Patient is having worsening pain and swelling and redness on left foot.  Recommended coming to ER for hospitalists admission for presumed infection and IV ABX and pain control.  Patient scheduled for left BKA on Wednesday.  May need to move up surgery date\\  [facility]

## 2024-09-17 NOTE — ED Triage Notes (Addendum)
 Pt to ed from home via POV for critical limb ischemia. Pt was seen last week and is scheduled for amputation on Wednesday. Pt pain is getting worse and the discoloration is worse and he was advised to come back in if this happened. Pt had an angio of the leg and has no circulation to the left lower extremity. Pt is caox4, in no acute distress and ambulatory.

## 2024-09-17 NOTE — ED Provider Notes (Signed)
 [facility] Provider Note    Event Date/Time   First MD Initiated Contact with Patient 09/17/24 2330     (approximate)   History   Leg Pain (Critical limb ischemia)   HPI Harold [name] is a 43 y.o. male whose medical history includes atherosclerosis of multiple arteries leading to multiple surgeries including autologous bypass graft years ago by Dr. Jama for the left leg.  He has known critical limb ischemia and is planned for a below-knee amputation of the left leg by Dr. Jama in about 4 days.  He presents tonight with intractable pain as well as worsening swelling and redness of the left foot.  He reports this is gotten worse over the last 1 to 2 days.  He called the on-call vascular surgeon, Dr. Serene, who recommended coming to the emergency department for pain control and antibiotics to treat possible infection so as not to further complicate or worsen the condition of the leg leading to a more extensive surgery.  Dr. Serene also mentioned that the surgery may be able to be moved up.  Patient has not had anything to eat for hours.  He has been doubling up on the outpatient pain medicines he was prescribed because of the severity of the pain particular over the last few days.  He has no other systemic signs or symptoms including no fever.      Physical Exam   Triage Vital Signs: ED Triage Vitals  Encounter Vitals Group     BP 09/17/24 2254 (!) 126/97     Girls Systolic BP Percentile --      Girls Diastolic BP Percentile --      Boys Systolic BP Percentile --      Boys Diastolic BP Percentile --      Pulse Rate 09/17/24 2254 88     Resp 09/17/24 2254 16     Temp 09/17/24 2254 97.8 F (36.6 C)     Temp Source 09/17/24 2254 Oral     SpO2 09/17/24 2254 98 %     Weight 09/17/24 2251 90.7 kg (200 lb)     Height 09/17/24 2251 1.829 m (6')     Head Circumference --      Peak Flow --      Pain Score 09/17/24 2255 10     Pain Loc --      Pain  Education --      Exclude from Growth Chart --     Most recent vital signs: Vitals:   09/17/24 2254  BP: (!) 126/97  Pulse: 88  Resp: 16  Temp: 97.8 F (36.6 C)  SpO2: 98%    General: Awake, no distress.  Generally well-appearing, pleasant and conversant. CV:  Cool, pale left foot and lower leg.  Relatively warm and well-perfused right leg and foot.  he is status post left great toe amputation.  He has redness and swelling of the foot, but without any open wounds. Resp:  Normal effort. Speaking easily and comfortably, no accessory muscle usage nor intercostal retractions.   Abd:  No distention.     ED Results / Procedures / Treatments   Labs (all labs ordered are listed, but only abnormal results are displayed) Labs Reviewed  COMPREHENSIVE METABOLIC PANEL WITH GFR - Abnormal; Notable for the following components:      Result Value   Alkaline Phosphatase 173 (*)    All other components within normal limits  LACTIC ACID, PLASMA  CBC WITH DIFFERENTIAL/PLATELET  PROTIME-INR  APTT  TYPE AND SCREEN     EKG  ED ECG REPORT I, Darleene Dome, the attending physician, personally viewed and interpreted this ECG.  Date: 09/18/2024 EKG Time: 1:01 AM Rate: 63 Rhythm: normal sinus rhythm with sinus arrhythmia QRS Axis: normal Intervals: normal ST/T Wave abnormalities: normal Narrative Interpretation: no evidence of acute ischemia   PROCEDURES:  Critical Care performed: Yes, see critical care procedure note(s)  .Critical Care  Performed by: Dome Darleene, MD Authorized by: Dome Darleene, MD   Critical care provider statement:    Critical care time (minutes):  30   Critical care time was exclusive of:  Separately billable procedures and treating other patients   Critical care was necessary to treat or prevent imminent or life-threatening deterioration of the following conditions:  Circulatory failure   Critical care was time spent personally by me on the following  activities:  Development of treatment plan with patient or surrogate, evaluation of patient's response to treatment, examination of patient, obtaining history from patient or surrogate, ordering and performing treatments and interventions, ordering and review of laboratory studies, ordering and review of radiographic studies, pulse oximetry, re-evaluation of patient's condition and review of old charts     IMPRESSION / MDM / ASSESSMENT AND PLAN / ED COURSE  I reviewed the triage vital signs and the nursing notes.                              Differential diagnosis includes, but is not limited to, ischemic limb, cellulitis, renal dysfunction or other electrolyte or metabolic abnormality.  Patient's presentation is most consistent with acute presentation with potential threat to life or bodily function.  Labs/studies ordered: Type and screen, lactic acid, pro time-INR, APTT, CBC with differential, CMP, EKG  Interventions/Medications given:  Medications  ceFEPIme (MAXIPIME) 2 g in sodium chloride  0.9 % 100 mL IVPB (has no administration in time range)  heparin  bolus via infusion 6,000 Units (has no administration in time range)  heparin  ADULT infusion 100 units/mL (25000 units/250mL) (has no administration in time range)  vancomycin (VANCOREADY) IVPB 2000 mg/400 mL (has no administration in time range)  morphine  (PF) 4 MG/ML injection 4 mg (has no administration in time range)  morphine  (PF) 4 MG/ML injection 4 mg (4 mg Intravenous Given 09/18/24 0006)  ondansetron  (ZOFRAN ) injection 4 mg (4 mg Intravenous Given 09/18/24 0006)    (Note:  hospital course my include additional interventions and/or labs/studies not listed above.)   Vital signs stable, labs all within normal limits.  I have verified in the medical record by reviewing the notes from Dr. Serene the patient's history and surgical plan.  Although I have a low suspicion for active infection, certainly no evidence of systemic  infection right now, I understand that limb infection would greatly complicate the plan for surgery, may lead to above-knee amputation rather than below-knee, and could greatly increase the morbidity and mortality risk.  I will treat empirically with cefepime 2 g IV and vancomycin as well as a heparin  bolus plus infusion.  I have asked the patient to remain n.p.o. until he can see Dr. Serene in the morning.  No indication for an emergent phone call to Dr. Serene tonight.  Patient understands and agrees.  I am ordering morphine  4 mg IV and Zofran  4 mg IV.  No indication for emergent fluids, will defer to hospitalist.  I am consulting the hospitalist team for admission  Clinical Course as of 09/18/24 0016  Sat Sep 18, 2024  0013 Patient still with substantial pain.  Ordering second dose of morphine  4 mg IV for his intractable pain. [CF]  0015 I consulted by phone with the admitting hospitalist, and they will admit the patient - Dr. Lawence. [CF]    Clinical Course User Index [CF] Gordan Huxley, MD     FINAL CLINICAL IMPRESSION(S) / ED DIAGNOSES   Final diagnoses:  Ischemic leg  Intractable pain  Cellulitis of left foot     Rx / DC Orders   ED Discharge Orders     None        Note:  This document was prepared using Dragon voice recognition software and may include unintentional dictation errors.   Gordan Huxley, MD 09/18/24 [phone]

## 2024-09-18 DIAGNOSIS — Z9862 Peripheral vascular angioplasty status: Secondary | ICD-10-CM | POA: Diagnosis not present

## 2024-09-18 DIAGNOSIS — I70222 Atherosclerosis of native arteries of extremities with rest pain, left leg: Secondary | ICD-10-CM | POA: Diagnosis not present

## 2024-09-18 DIAGNOSIS — L03116 Cellulitis of left lower limb: Secondary | ICD-10-CM | POA: Diagnosis present

## 2024-09-18 DIAGNOSIS — I998 Other disorder of circulatory system: Secondary | ICD-10-CM | POA: Diagnosis present

## 2024-09-18 DIAGNOSIS — I731 Thromboangiitis obliterans [Buerger's disease]: Secondary | ICD-10-CM

## 2024-09-18 DIAGNOSIS — I96 Gangrene, not elsewhere classified: Secondary | ICD-10-CM | POA: Diagnosis not present

## 2024-09-18 DIAGNOSIS — I70202 Unspecified atherosclerosis of native arteries of extremities, left leg: Secondary | ICD-10-CM

## 2024-09-18 DIAGNOSIS — Z9889 Other specified postprocedural states: Secondary | ICD-10-CM | POA: Diagnosis not present

## 2024-09-18 LAB — BASIC METABOLIC PANEL WITH GFR
Anion gap: 5 (ref 5–15)
BUN: 18 mg/dL (ref 6–20)
CO2: 28 mmol/L (ref 22–32)
Calcium: 8.9 mg/dL (ref 8.9–10.3)
Chloride: 105 mmol/L (ref 98–111)
Creatinine, Ser: 0.99 mg/dL (ref 0.61–1.24)
GFR, Estimated: >60 mL/min (ref >60)
Glucose, Bld: 104 mg/dL — ABNORMAL HIGH (ref 70–99)
Potassium: 4.2 mmol/L (ref 3.5–5.1)
Sodium: 138 mmol/L (ref 135–145)

## 2024-09-18 LAB — HIV ANTIBODY (ROUTINE TESTING W REFLEX): HIV Screen 4th Generation wRfx: NONREACTIVE

## 2024-09-18 LAB — TYPE AND SCREEN
ABO/RH(D): A POS
Antibody Screen: NEGATIVE

## 2024-09-18 LAB — HEPARIN LEVEL (UNFRACTIONATED)
Heparin Unfractionated: 0.51 [IU]/mL (ref 0.30–0.70)
Heparin Unfractionated: 0.34 [IU]/mL (ref 0.30–0.70)

## 2024-09-18 LAB — CBC
HCT: 42.1 % (ref 39.0–52.0)
Hemoglobin: 14 g/dL (ref 13.0–17.0)
MCH: 29.7 pg (ref 26.0–34.0)
MCHC: 33.3 g/dL (ref 30.0–36.0)
MCV: 89.2 fL (ref 80.0–100.0)
Platelets: 223 K/uL (ref 150–400)
RBC: 4.72 MIL/uL (ref 4.22–5.81)
RDW: 13.3 % (ref 11.5–15.5)
WBC: 8.3 K/uL (ref 4.0–10.5)
nRBC: 0 % (ref 0.0–0.2)

## 2024-09-18 MED ORDER — TRAZODONE HCL 50 MG PO TABS
25.0000 mg | ORAL_TABLET | Freq: Every evening | ORAL | Status: DC | PRN
  Administered 2024-09-20 – 2024-09-23 (×3): 25 mg via ORAL
  Filled 2024-09-18 (×3): qty 1

## 2024-09-18 MED ORDER — MAGNESIUM HYDROXIDE 400 MG/5ML PO SUSP
30.0000 mL | Freq: Every day | ORAL | Status: DC | PRN
  Administered 2024-09-19: 30 mL via ORAL
  Filled 2024-09-18: qty 30

## 2024-09-18 MED ORDER — HEPARIN (PORCINE) 25000 UT/250ML-% IV SOLN
1800.0000 [IU]/h | INTRAVENOUS | Status: DC
  Administered 2024-09-18 – 2024-09-19 (×4): 1500 [IU]/h via INTRAVENOUS
  Administered 2024-09-20 – 2024-09-21 (×2): 1800 [IU]/h via INTRAVENOUS
  Filled 2024-09-18 (×6): qty 250

## 2024-09-18 MED ORDER — SODIUM CHLORIDE 0.9 % IV SOLN: INTRAVENOUS | Status: AC

## 2024-09-18 MED ORDER — MORPHINE SULFATE (PF) 4 MG/ML IV SOLN
4.0000 mg | Freq: Once | INTRAVENOUS | Status: AC
  Administered 2024-09-18: 4 mg via INTRAVENOUS
  Filled 2024-09-18: qty 1

## 2024-09-18 MED ORDER — VANCOMYCIN HCL 2000 MG/400ML IV SOLN
2000.0000 mg | Freq: Once | INTRAVENOUS | Status: AC
  Administered 2024-09-18: 2000 mg via INTRAVENOUS
  Filled 2024-09-18: qty 400

## 2024-09-18 MED ORDER — ACETAMINOPHEN 325 MG PO TABS
650.0000 mg | ORAL_TABLET | Freq: Four times a day (QID) | ORAL | Status: DC | PRN
  Administered 2024-09-24 – 2024-09-25 (×2): 650 mg via ORAL
  Filled 2024-09-18 (×2): qty 2

## 2024-09-18 MED ORDER — VANCOMYCIN HCL 1250 MG/250ML IV SOLN
1250.0000 mg | Freq: Two times a day (BID) | INTRAVENOUS | Status: DC
  Administered 2024-09-18 – 2024-09-19 (×3): 1250 mg via INTRAVENOUS
  Filled 2024-09-18 (×3): qty 250

## 2024-09-18 MED ORDER — NAPHAZOLINE-PHENIRAMINE 0.025-0.3 % OP SOLN: 1.0000 [drp] | Freq: Four times a day (QID) | OPHTHALMIC | Status: DC | PRN

## 2024-09-18 MED ORDER — HEPARIN BOLUS VIA INFUSION
6000.0000 [IU] | Freq: Once | INTRAVENOUS | Status: AC
  Administered 2024-09-18: 6000 [IU] via INTRAVENOUS
  Filled 2024-09-18: qty 6000

## 2024-09-18 MED ORDER — ONDANSETRON HCL 4 MG/2ML IJ SOLN: 4.0000 mg | Freq: Four times a day (QID) | INTRAMUSCULAR | Status: DC | PRN

## 2024-09-18 MED ORDER — ACETAMINOPHEN 650 MG RE SUPP: 650.0000 mg | Freq: Four times a day (QID) | RECTAL | Status: DC | PRN

## 2024-09-18 MED ORDER — OXYCODONE HCL 5 MG PO TABS
5.0000 mg | ORAL_TABLET | Freq: Four times a day (QID) | ORAL | Status: DC | PRN
  Administered 2024-09-18 – 2024-09-20 (×8): 5 mg via ORAL
  Filled 2024-09-18 (×8): qty 1

## 2024-09-18 MED ORDER — ONDANSETRON HCL 4 MG PO TABS: 4.0000 mg | ORAL_TABLET | Freq: Four times a day (QID) | ORAL | Status: DC | PRN

## 2024-09-18 NOTE — Progress Notes (Addendum)
 Nonbillable note Patient admitted to the hospital for evaluation of sudden onset left foot and leg pain associated with swelling and redness as well as purplish discoloration of his foot. Patient has been following with vascular surgery and is scheduled for left BKA on 12/02. Will continue IV heparin  and IV antibiotics Pain control Vascular surgery consult

## 2024-09-18 NOTE — Consult Note (Signed)
 PHARMACY - ANTICOAGULATION CONSULT NOTE  Pharmacy Consult for Heparin  Indication: limb ischemia  No Known Allergies  Patient Measurements: Height: 6' (182.9 cm) Weight: 89.9 kg (198 lb 3.1 oz) IBW/kg (Calculated) : 77.6 HEPARIN  DW (KG): 89.9  Vital Signs: Temp: 98.4 F (36.9 C) (11/29 0817) Temp Source: Oral (11/29 0817) BP: 110/85 (11/29 0817) Pulse Rate: 64 (11/29 0817)  Labs: Recent Labs    09/17/24 2257 09/18/24 0648 09/18/24 1328  HGB 14.2 14.0  --   HCT 42.9 42.1  --   PLT 254 223  --   APTT 32  --   --   LABPROT 13.6  --   --   INR 1.0  --   --   HEPARINUNFRC  --  0.51 0.34  CREATININE 1.12 0.99  --     Estimated Creatinine Clearance: 104.5 mL/min (by C-G formula based on SCr of 0.99 mg/dL).   Medical History: Past Medical History:  Diagnosis Date   Anxiety    Arthritis    Back pain    Peripheral vascular disease    Shingles     Medications:  No history of chronic anticoagulant use PTA  Assessment: 43 y.o. male whose medical history includes atherosclerosis of multiple arteries leading to multiple surgeries including autologous bypass graft years ago by Dr. Jama for the left leg.  He has known critical limb ischemia and is planned for a below-knee amputation of the left leg by Dr. Jama next week. Per patient, the swelling and redness of his left foot has worsened. Started on heparin  infusion. Vascular to see patient and assess left leg/foot.  Baseline labs: aPTT 32 sec, INR 1.0, Hgb 14.2, Plts 254  Goal of Therapy:  Heparin  level 0.3-0.7 units/ml Monitor platelets by anticoagulation protocol: Yes  11/29 0648 HL 0.51, therapeutic x1 11/29 1328 HL 0.34, therapeutic x 2   Plan:  11/29 1328 HL 0.34, therapeutic x 2 continue heparin  infusion at 1500 units/hr Check heparin  level with am labs and daily while on heparin  Continue to monitor H&H and platelets  Allean Haas PharmD Clinical Pharmacist 09/18/2024

## 2024-09-18 NOTE — Assessment & Plan Note (Addendum)
-   This could be the main factor for his peripheral vascular disease. - Management as above.

## 2024-09-18 NOTE — Consult Note (Signed)
 PHARMACY - ANTICOAGULATION CONSULT NOTE  Pharmacy Consult for Heparin  Indication: limb ischemia  No Known Allergies  Patient Measurements: Height: 6' (182.9 cm) Weight: 89.9 kg (198 lb 3.1 oz) IBW/kg (Calculated) : 77.6 HEPARIN  DW (KG): 89.9  Vital Signs: Temp: 98.1 F (36.7 C) (11/29 0353) Temp Source: Oral (11/29 0353) BP: 121/78 (11/29 0353) Pulse Rate: 68 (11/29 0353)  Labs: Recent Labs    09/17/24 2257 09/18/24 0648  HGB 14.2 14.0  HCT 42.9 42.1  PLT 254 223  APTT 32  --   LABPROT 13.6  --   INR 1.0  --   HEPARINUNFRC  --  0.51  CREATININE 1.12 0.99    Estimated Creatinine Clearance: 104.5 mL/min (by C-G formula based on SCr of 0.99 mg/dL).   Medical History: Past Medical History:  Diagnosis Date   Anxiety    Arthritis    Back pain    Peripheral vascular disease    Shingles     Medications:  No history of chronic anticoagulant use PTA  Assessment: 43 y.o. male whose medical history includes atherosclerosis of multiple arteries leading to multiple surgeries including autologous bypass graft years ago by Dr. Jama for the left leg.  He has known critical limb ischemia and is planned for a below-knee amputation of the left leg by Dr. Jama next week. Per patient, the swelling and redness of his left foot has worsened. Started on heparin  infusion. Vascular to see patient and assess left leg/foot.  Baseline labs: aPTT 32 sec, INR 1.0, Hgb 14.2, Plts 254  Goal of Therapy:  Heparin  level 0.3-0.7 units/ml Monitor platelets by anticoagulation protocol: Yes  11/29 0648 HL 0.51, therapeutic x1   Plan:  11/29 0648 HL 0.51, therapeutic x1 continue heparin  infusion at 1500 units/hr Check confirmatory heparin  level in 6 hours and daily while on heparin  Continue to monitor H&H and platelets  Allean Haas PharmD Clinical Pharmacist 09/18/2024

## 2024-09-18 NOTE — H&P (Addendum)
 [place]   PATIENT NAME: Harold [name]    MR#:  [id number]  DATE OF BIRTH:  07-05-1981  DATE OF ADMISSION:  09/17/2024  PRIMARY CARE PHYSICIAN: Podraza, Cole Christopher, PA-C   Patient is coming from: Home  REQUESTING/REFERRING PHYSICIAN: Gordan Huxley, MD  CHIEF COMPLAINT:   Chief Complaint  Patient presents with   Leg Pain    Critical limb ischemia    HISTORY OF PRESENT ILLNESS:  Harold [name] is a 43 y.o. Caucasian male male with medical history significant for anxiety, osteoarthritis, peripheral vascular disease status post left big toe amputation, who presented to the emergency room with acute onset of worsening left foot and leg pain with associated swelling and mild erythema.  The patient is a non-smoker.  He noticed discoloration with purplish color of his left foot.  No fever or chills.  No chest pain or palpitations.  No cough or wheezing or dyspnea.  No nausea or vomiting or abdominal pain.  No bleeding diathesis.  He has been following with Dr. Jama and is apparently scheduled for left below-knee amputation on 12/2.  ED Course: When he came to the ER, BP is 126/197 with otherwise normal vital signs.  CMP with was remarkable for an alk phos of 173.  CBC was normal.  Coag profile was normal.  Blood group was A+ with negative antibody screen. EKG as reviewed by me : EKG showed normal sinus rhythm with a rate of 63 with sinus arrhythmia. Imaging: None.  The patient was given IV vancomycin , 4 mg of IV Zofran  and 4 mg of IV morphine  sulfate twice as well as IV heparin .  He will be admitted to a telemetry bed for further evaluation and management.  PAST MEDICAL HISTORY:   Past Medical History:  Diagnosis Date   Anxiety    Arthritis    Back pain    Peripheral vascular disease    Shingles     PAST SURGICAL HISTORY:   Past Surgical History:  Procedure Laterality Date   BYPASS GRAFT POPLITEAL TO POPLITEAL Left 09/20/2015   Procedure: BYPASS GRAFT  POPLITEAL TO PERONEAL;  Surgeon: Cordella KANDICE Jama, MD;  Location: [facility] ORS;  Service: Vascular;  Laterality: Left;   EMBOLECTOMY Left 09/20/2015   Procedure: EMBOLECTOMY;  Surgeon: Cordella KANDICE Jama, MD;  Location: [facility];  Service: Vascular;  Laterality: Left;   LOWER EXTREMITY ANGIOGRAPHY Left 07/22/2017   Procedure: Lower Extremity Angiography;  Surgeon: Jama Cordella KANDICE, MD;  Location: [facility];  Service: Cardiovascular;  Laterality: Left;   LOWER EXTREMITY ANGIOGRAPHY Left 09/10/2024   Procedure: Lower Extremity Angiography;  Surgeon: Marea Selinda RAMAN, MD;  Location: [facility];  Service: Cardiovascular;  Laterality: Left;   LOWER EXTREMITY INTERVENTION  07/22/2017   Procedure: LOWER EXTREMITY INTERVENTION;  Surgeon: Jama Cordella KANDICE, MD;  Location: [facility];  Service: Cardiovascular;;   LUMBAR LAMINECTOMY/DECOMPRESSION MICRODISCECTOMY Right 12/29/2012   Procedure: SPINE: LAMINOTOMY DECOMPRESSION NERVE ROOT EXCISION HERNIATED DISK 1 SPACE LUMBAR;  Surgeon: Lacinda Ozell Mans, MD;  Location: [facility];  Service: Orthopedics;  Laterality: Right;  RIGHT L5-S1 DISC EXCISION   ORIF ANKLE FRACTURE Left 09/20/2015   Procedure: OPEN REDUCTION INTERNAL FIXATION (ORIF) ANKLE FRACTURE;  Surgeon: Ozell Flake, MD;  Location: [facility];  Service: Orthopedics;  Laterality: Left;   PERIPHERAL VASCULAR CATHETERIZATION Left 03/21/2015   Procedure: Lower Extremity Angiography;  Surgeon: Cordella KANDICE Jama, MD;  Location: [facility];  Service: Cardiovascular;  Laterality: Left;   PERIPHERAL VASCULAR CATHETERIZATION  03/21/2015   Procedure: Lower Extremity Intervention;  Surgeon: Cordella KANDICE Shawl, MD;  Location: [facility];  Service: Cardiovascular;;   PERIPHERAL VASCULAR CATHETERIZATION Left 09/05/2015   Procedure: Lower Extremity Angiography;  Surgeon: Cordella KANDICE Shawl, MD;  Location: [facility];  Service: Cardiovascular;  Laterality: Left;    PERIPHERAL VASCULAR CATHETERIZATION  09/05/2015   Procedure: Lower Extremity Intervention;  Surgeon: Cordella KANDICE Shawl, MD;  Location: [facility];  Service: Cardiovascular;;   PERIPHERAL VASCULAR CATHETERIZATION Left 08/27/2016   Procedure: Lower Extremity Angiography;  Surgeon: Cordella KANDICE Shawl, MD;  Location: [facility];  Service: Cardiovascular;  Laterality: Left;   TOOTH EXTRACTION     WISDOM TOOTH EXTRACTION      SOCIAL HISTORY:   Social History   Tobacco Use   Smoking status: Former    Current packs/day: 0.00    Average packs/day: 0.5 packs/day for 16.0 years (8.0 ttl pk-yrs)    Types: Cigarettes    Quit date: 2018    Years since quitting: 7.9   Smokeless tobacco: Never  Substance Use Topics   Alcohol use: No    Alcohol/week: 0.0 standard drinks of alcohol    Comment: rare    FAMILY HISTORY:   Family History  Problem Relation Age of Onset   Cancer Mother    Diabetes Mother    Hypertension Mother    Hyperlipidemia Mother    Heart attack Father    Cancer Father     DRUG ALLERGIES:  No Known Allergies  REVIEW OF SYSTEMS:   ROS As per history of present illness. All pertinent systems were reviewed above. Constitutional, HEENT, cardiovascular, respiratory, GI, GU, musculoskeletal, neuro, psychiatric, endocrine, integumentary and hematologic systems were reviewed and are otherwise negative/unremarkable except for positive findings mentioned above in the HPI.   MEDICATIONS AT HOME:   Prior to Admission medications   Medication Sig Start Date End Date Taking? Authorizing Provider  aspirin  EC 81 MG tablet Take 81 mg by mouth at bedtime. Swallow whole.   Yes [provider]  clopidogrel  (PLAVIX ) 75 MG tablet Take 1 tablet (75 mg total) by mouth daily. Patient taking differently: Take 75 mg by mouth at bedtime. 06/22/24  Yes Schnier, Cordella KANDICE, MD  naphazoline-pheniramine (ALLERGY EYE) 0.025-0.3 % ophthalmic solution Place 1-2 drops into  both eyes 4 (four) times daily as needed for eye irritation.   Yes [provider]  oxyCODONE  (OXY IR/ROXICODONE ) 5 MG immediate release tablet Take 1 tablet (5 mg total) by mouth every 6 (six) hours as needed for severe pain (pain score 7-10). 09/14/24  Yes Delores Orvin BRAVO, NP      VITAL SIGNS:  Blood pressure 121/78, pulse 68, temperature 98.1 F (36.7 C), temperature source Oral, resp. rate 19, height 6' (1.829 m), weight 89.9 kg, SpO2 95%.  PHYSICAL EXAMINATION:  Physical Exam  GENERAL:  43 y.o.-year-old patient lying in the bed with no acute distress.  EYES: Pupils equal, round, reactive to light and accommodation. No scleral icterus. Extraocular muscles intact.  HEENT: Head atraumatic, normocephalic. Oropharynx and nasopharynx clear.  NECK:  Supple, no jugular venous distention. No thyroid enlargement, no tenderness.  LUNGS: Normal breath sounds bilaterally, no wheezing, rales,rhonchi or crepitation. No use of accessory muscles of respiration.  CARDIOVASCULAR: Regular rate and rhythm, S1, S2 normal. No murmurs, rubs, or gallops.  No palpable left DP or PT pulses. ABDOMEN: Soft, nondistended, nontender. Bowel sounds present. No  organomegaly or mass.  EXTREMITIES: No pedal edema, cyanosis, or clubbing.  He is status post left big toe amputation. NEUROLOGIC: Cranial nerves II through XII are intact. Muscle strength 5/5 in all extremities. Sensation intact. Gait not checked.  PSYCHIATRIC: The patient is alert and oriented x 3.  Normal affect and good eye contact. SKIN: Left dorsal foot has minimal erythema erythema and is cool with purplish discoloration.    LABORATORY PANEL:   CBC Recent Labs  Lab 09/17/24 2257  WBC 8.3  HGB 14.2  HCT 42.9  PLT 254   ------------------------------------------------------------------------------------------------------------------  Chemistries  Recent Labs  Lab 09/17/24 2257  NA 139  K 4.2  CL 102  CO2 29  GLUCOSE 93  BUN 20   CREATININE 1.12  CALCIUM  9.7  AST 27  ALT 39  ALKPHOS 173*  BILITOT 0.4   ------------------------------------------------------------------------------------------------------------------  Cardiac Enzymes No results for input(s): TROPONINI in the last 168 hours. ------------------------------------------------------------------------------------------------------------------  RADIOLOGY:  No results found.    IMPRESSION AND PLAN:  Assessment and Plan: * Critical limb ischemia of left lower extremity (HCC) - Patient will be admitted to a medical telemetry bed. - Will continue IV heparin . - Pain management will be provided. - Will continue IV vancomycin per vascular surgery recommendations for potential associated mild cellulitis.. - Vascular surgery consult to be obtained. - Dr. Serene was notified about the patient. - Will hold off aspirin  and Plavix  for now for potential surgical intervention.  Thromboangiitis obliterans (Buerger's disease) - This could be the main factor for his peripheral vascular disease. - Management as above.    DVT prophylaxis: Lovenox.  Advanced Care Planning:  Code Status: full code.  Family Communication:  The plan of care was discussed in details with the patient (and family). I answered all questions. The patient agreed to proceed with the above mentioned plan. Further management will depend upon hospital course. Disposition Plan: Back to previous home environment Consults called: none.  All the records are reviewed and case discussed with ED provider.  Status is: Inpatient  At the time of the admission, it appears that the appropriate admission status for this patient is inpatient.  This is judged to be reasonable and necessary in order to provide the required intensity of service to ensure the patient's safety given the presenting symptoms, physical exam findings and initial radiographic and laboratory data in the context of comorbid  conditions.  The patient requires inpatient status due to high intensity of service, high risk of further deterioration and high frequency of surveillance required.  I certify that at the time of admission, it is my clinical judgment that the patient will require inpatient hospital care extending more than 2 midnights.                            Dispo: The patient is from: Home              Anticipated d/c is to: Home              Patient currently is not medically stable to d/c.              Difficult to place patient: No  Madison DELENA Peaches M.D on 09/18/2024 at 5:22 AM  [facility]   From 7 PM-7 AM, contact night-coverage [email or web address]  CC: Primary care physician; Podraza, Cole Christopher, PA-C

## 2024-09-18 NOTE — Plan of Care (Signed)

## 2024-09-18 NOTE — Consult Note (Signed)
 [facility] [place]  Patient name: Harold [name] MRN: [id number] DOB: 11-20-1980 Sex: male   REQUESTING PROVIDER:    Hospital service   REASON FOR CONSULT:    Left leg ischemia  HISTORY OF PRESENT ILLNESS:   Harold [name] is a 43 y.o. male, who has an ischemic left foot.  His disease has been felt to be related to Buerger's.  He is no longer smoking.  He has exhausted options for revascularization and has been scheduled for left below-knee amputation next week.  His wife called stating that he is having worsening pain and redness on top of his foot.  He denies any fevers or drainage.  He was encouraged to come to the emergency department.  PAST MEDICAL HISTORY    Past Medical History:  Diagnosis Date   Anxiety    Arthritis    Back pain    Peripheral vascular disease    Shingles      FAMILY HISTORY   Family History  Problem Relation Age of Onset   Cancer Mother    Diabetes Mother    Hypertension Mother    Hyperlipidemia Mother    Heart attack Father    Cancer Father     SOCIAL HISTORY:   Social History   Socioeconomic History   Marital status: Married    Spouse name: Not on file   Number of children: Not on file   Years of education: Not on file   Highest education level: Not on file  Occupational History   Not on file  Tobacco Use   Smoking status: Former    Current packs/day: 0.00    Average packs/day: 0.5 packs/day for 16.0 years (8.0 ttl pk-yrs)    Types: Cigarettes    Quit date: 2018    Years since quitting: 7.9   Smokeless tobacco: Never  Vaping Use   Vaping status: Never Used  Substance and Sexual Activity   Alcohol use: No    Alcohol/week: 0.0 standard drinks of alcohol    Comment: rare   Drug use: No   Sexual activity: Not on file  Other Topics Concern   Not on file  Social History Narrative   Not on file   Social Drivers of Health   Financial Resource Strain: Low Risk   (03/18/2023)   Received from [facility]   Overall Financial Resource Strain (CARDIA)    Difficulty of Paying Living Expenses: Not hard at all  Food Insecurity: No Food Insecurity (09/18/2024)   Hunger Vital Sign    Worried About Running Out of Food in the Last Year: Never true    Ran Out of Food in the Last Year: Never true  Transportation Needs: No Transportation Needs (09/18/2024)   PRAPARE - Administrator, Civil Service (Medical): No    Lack of Transportation (Non-Medical): No  Physical Activity: Not on file  Stress: Not on file  Social Connections: Unknown (03/04/2022)   Received from [facility]   Social Network    Social Network: Not on file  Intimate Partner Violence: Not At Risk (09/18/2024)   Humiliation, Afraid, Rape, and Kick questionnaire    Fear of Current or Ex-Partner: No    Emotionally Abused: No    Physically Abused: No    Sexually Abused: No    ALLERGIES:    No Known Allergies  CURRENT MEDICATIONS:    Current Facility-Administered Medications  Medication Dose Route Frequency Provider Last Rate Last Admin  0.9 %  sodium chloride  infusion   Intravenous Continuous Mansy, Jan A, MD 100 mL/hr at 09/18/24 1553 Infusion Verify at 09/18/24 1553   acetaminophen  (TYLENOL ) tablet 650 mg  650 mg Oral Q6H PRN Mansy, Jan A, MD       Or   acetaminophen  (TYLENOL ) suppository 650 mg  650 mg Rectal Q6H PRN Mansy, Jan A, MD       heparin  ADULT infusion 100 units/mL (25000 units/250mL)  1,500 Units/hr Intravenous Continuous Nazari, Walid A, RPH 15 mL/hr at 09/18/24 1319 1,500 Units/hr at 09/18/24 1319   magnesium  hydroxide (MILK OF MAGNESIA) suspension 30 mL  30 mL Oral Daily PRN Mansy, Jan A, MD       naphazoline-pheniramine (NAPHCON-A) 0.025-0.3 % ophthalmic solution 1-2 drop  1-2 drop Both Eyes QID PRN Mansy, Jan A, MD       ondansetron  (ZOFRAN ) tablet 4 mg  4 mg Oral Q6H PRN Mansy, Jan A, MD       Or   ondansetron  (ZOFRAN ) injection 4 mg  4 mg  Intravenous Q6H PRN Mansy, Jan A, MD       oxyCODONE  (Oxy IR/ROXICODONE ) immediate release tablet 5 mg  5 mg Oral Q6H PRN Mansy, Jan A, MD   5 mg at 09/18/24 1623   traZODone (DESYREL) tablet 25 mg  25 mg Oral QHS PRN Mansy, Jan A, MD       vancomycin (VANCOREADY) IVPB 1250 mg/250 mL  1,250 mg Intravenous Q12H Elesa Perkins, RPH 166.7 mL/hr at 09/18/24 1322 1,250 mg at 09/18/24 1322    REVIEW OF SYSTEMS:   [X]  denotes positive finding, [ ]  denotes negative finding Cardiac  Comments:  Chest pain or chest pressure:    Shortness of breath upon exertion:    Short of breath when lying flat:    Irregular heart rhythm:        Vascular    Pain in calf, thigh, or hip brought on by ambulation:    Pain in feet at night that wakes you up from your sleep:     Blood clot in your veins:    Leg swelling:         Pulmonary    Oxygen at home:    Productive cough:     Wheezing:         Neurologic    Sudden weakness in arms or legs:     Sudden numbness in arms or legs:     Sudden onset of difficulty speaking or slurred speech:    Temporary loss of vision in one eye:     Problems with dizziness:         Gastrointestinal    Blood in stool:      Vomited blood:         Genitourinary    Burning when urinating:     Blood in urine:        Psychiatric    Major depression:         Hematologic    Bleeding problems:    Problems with blood clotting too easily:        Skin    Rashes or ulcers:        Constitutional    Fever or chills:     PHYSICAL EXAM:   Vitals:   09/18/24 0353 09/18/24 0817 09/18/24 1534 09/18/24 1956  BP: 121/78 110/85 (!) 140/79 (!) 148/81  Pulse: 68 64 68 60  Resp: 19 18 16 16   Temp: 98.1 F (36.7 C)  98.4 F (36.9 C) (!) 97.5 F (36.4 C) 97.7 F (36.5 C)  TempSrc: Oral Oral Oral   SpO2: 95% 96% 98% 100%  Weight:      Height:        GENERAL: The patient is a well-nourished male, in no acute distress. The vital signs are documented above. CARDIAC: There  is a regular rate and rhythm.  VASCULAR: Nonpalpable pedal pulses PULMONARY: Nonlabored respirations ABDOMEN: Soft and non-tender with normal pitched bowel sounds.  MUSCULOSKELETAL: There are no major deformities or cyanosis. NEUROLOGIC: No focal weakness or paresthesias are detected. SKIN: There are no ulcers or rashes noted. PSYCHIATRIC: The patient has a normal affect.  STUDIES:     ASSESSMENT and PLAN   Ischemic left foot: There is atherosclerotic disease is felt to be secondary to Buerger's disease.  He has exhausted surgical options.  He has been admitted for pain control and IV antibiotics given mild cellulitis on his foot.  Left below-knee amputation is scheduled for next week on Wednesday however we will try to move up his surgical date.   Malvina Serene CLORE, MD, FACS [facility] [phone] Pager [phone]

## 2024-09-18 NOTE — Progress Notes (Signed)
 Pharmacy Antibiotic Note  Harold [name] is a 43 y.o. male admitted on 09/17/2024 with cellulitis.  Pharmacy has been consulted for vancomycin dosing.  Plan: Vancomycin load of 2000 mg IV x1 given in ED Start vancomycin 1250 mg IV every 12 hours (eAUC 423.8, Scr 0.99, IBW used, Vd 0.72 L/kg) Continue to monitor renal function, clinical status, and antibiotic LOT  Height: 6' (182.9 cm) Weight: 89.9 kg (198 lb 3.1 oz) IBW/kg (Calculated) : 77.6  Temp (24hrs), Avg:98 F (36.7 C), Min:97.6 F (36.4 C), Max:98.4 F (36.9 C)  Recent Labs  Lab 09/17/24 2257 09/18/24 0648  WBC 8.3 8.3  CREATININE 1.12 0.99  LATICACIDVEN 0.8  --     Estimated Creatinine Clearance: 104.5 mL/min (by C-G formula based on SCr of 0.99 mg/dL).    No Known Allergies  Antimicrobials this admission: cefepime 11/29 x1 vancomycin 11/19 >>   Dose adjustments this admission: N/A  Microbiology results: N/A  Thank you for involving pharmacy in this patient's care.   Damien Napoleon, PharmD Clinical Pharmacist 09/18/2024 11:20 AM

## 2024-09-18 NOTE — Assessment & Plan Note (Addendum)
-   Patient will be admitted to a medical telemetry bed. - Will continue IV heparin . - Pain management will be provided. - Will continue IV vancomycin per vascular surgery recommendations for potential associated mild cellulitis.. - Vascular surgery consult to be obtained. - Dr. Serene was notified about the patient. - Will hold off aspirin  and Plavix  for now for potential surgical intervention.

## 2024-09-18 NOTE — Consult Note (Signed)
 PHARMACY - ANTICOAGULATION CONSULT NOTE  Pharmacy Consult for Heparin  Indication: limb ischemia  No Known Allergies  Patient Measurements: Height: 6' (182.9 cm) Weight: 90.7 kg (200 lb) IBW/kg (Calculated) : 77.6 HEPARIN  DW (KG): 90.7  Vital Signs: Temp: 97.8 F (36.6 C) (11/28 2254) Temp Source: Oral (11/28 2254) BP: 126/97 (11/28 2254) Pulse Rate: 88 (11/28 2254)  Labs: Recent Labs    09/17/24 2257  HGB 14.2  HCT 42.9  PLT 254  APTT 32  LABPROT 13.6  INR 1.0  CREATININE 1.12    Estimated Creatinine Clearance: 92.4 mL/min (by C-G formula based on SCr of 1.12 mg/dL).   Medical History: Past Medical History:  Diagnosis Date   Anxiety    Arthritis    Back pain    Peripheral vascular disease    Shingles     Medications:  No history of chronic anticoagulant use PTA  Assessment: 43 y.o. male whose medical history includes atherosclerosis of multiple arteries leading to multiple surgeries including autologous bypass graft years ago by Dr. Jama for the left leg.  He has known critical limb ischemia and is planned for a below-knee amputation of the left leg by Dr. Jama next week. Per patient, the swelling and redness of his left foot has worsened. Started on heparin  infusion. Vascular to see patient and assess left leg/foot.  Baseline labs: aPTT 32 sec, INR 1.0, Hgb 14.2, Plts 254  Goal of Therapy:  Heparin  level 0.3-0.7 units/ml Monitor platelets by anticoagulation protocol: Yes   Plan:  Give 6000 units bolus x 1 Start heparin  infusion at 1500 units/hr Check anti-Xa level in 6 hours and daily while on heparin  Continue to monitor H&H and platelets  Harold [name] A Harold [name] 09/18/2024,12:08 AM

## 2024-09-19 DIAGNOSIS — I998 Other disorder of circulatory system: Secondary | ICD-10-CM

## 2024-09-19 LAB — CBC
HCT: 40.5 % (ref 39.0–52.0)
Hemoglobin: 13.4 g/dL (ref 13.0–17.0)
MCH: 29.5 pg (ref 26.0–34.0)
MCHC: 33.1 g/dL (ref 30.0–36.0)
MCV: 89.2 fL (ref 80.0–100.0)
Platelets: 232 K/uL (ref 150–400)
RBC: 4.54 MIL/uL (ref 4.22–5.81)
RDW: 13.3 % (ref 11.5–15.5)
WBC: 7 K/uL (ref 4.0–10.5)
nRBC: 0 % (ref 0.0–0.2)

## 2024-09-19 LAB — HEPARIN LEVEL (UNFRACTIONATED): Heparin Unfractionated: 0.35 [IU]/mL (ref 0.30–0.70)

## 2024-09-19 MED ORDER — SENNOSIDES-DOCUSATE SODIUM 8.6-50 MG PO TABS
2.0000 | ORAL_TABLET | Freq: Every day | ORAL | Status: DC
  Administered 2024-09-19 – 2024-09-24 (×5): 2 via ORAL
  Filled 2024-09-19 (×5): qty 2

## 2024-09-19 MED ORDER — MORPHINE SULFATE (PF) 2 MG/ML IV SOLN
2.0000 mg | INTRAVENOUS | Status: DC | PRN
  Administered 2024-09-19 – 2024-09-21 (×12): 2 mg via INTRAVENOUS
  Filled 2024-09-19 (×12): qty 1

## 2024-09-19 MED ORDER — CEFAZOLIN SODIUM-DEXTROSE 2-4 GM/100ML-% IV SOLN
2.0000 g | Freq: Three times a day (TID) | INTRAVENOUS | Status: DC
  Administered 2024-09-19 – 2024-09-22 (×8): 2 g via INTRAVENOUS
  Filled 2024-09-19 (×9): qty 100

## 2024-09-19 MED ORDER — POLYETHYLENE GLYCOL 3350 17 G PO PACK
17.0000 g | PACK | Freq: Every day | ORAL | Status: DC
  Administered 2024-09-19 – 2024-09-22 (×3): 17 g via ORAL
  Filled 2024-09-19 (×3): qty 1

## 2024-09-19 NOTE — Consult Note (Signed)
 PHARMACY - ANTICOAGULATION CONSULT NOTE  Pharmacy Consult for Heparin  Indication: limb ischemia  No Known Allergies  Patient Measurements: Height: 6' (182.9 cm) Weight: 89.9 kg (198 lb 3.1 oz) IBW/kg (Calculated) : 77.6 HEPARIN  DW (KG): 89.9  Vital Signs: Temp: 97.6 F (36.4 C) (11/30 0347) Temp Source: Oral (11/30 0347) BP: 130/91 (11/30 0505) Pulse Rate: 51 (11/30 0505)  Labs: Recent Labs    09/17/24 2257 09/18/24 0648 09/18/24 1328 09/19/24 0432  HGB 14.2 14.0  --  13.4  HCT 42.9 42.1  --  40.5  PLT 254 223  --  232  APTT 32  --   --   --   LABPROT 13.6  --   --   --   INR 1.0  --   --   --   HEPARINUNFRC  --  0.51 0.34 0.35  CREATININE 1.12 0.99  --   --     Estimated Creatinine Clearance: 104.5 mL/min (by C-G formula based on SCr of 0.99 mg/dL).   Medical History: Past Medical History:  Diagnosis Date   Anxiety    Arthritis    Back pain    Peripheral vascular disease    Shingles     Medications:  No history of chronic anticoagulant use PTA  Assessment: 43 y.o. male whose medical history includes atherosclerosis of multiple arteries leading to multiple surgeries including autologous bypass graft years ago by Dr. Jama for the left leg.  He has known critical limb ischemia and is planned for a below-knee amputation of the left leg by Dr. Jama next week. Per patient, the swelling and redness of his left foot has worsened. Started on heparin  infusion. Vascular to see patient and assess left leg/foot.  Baseline labs: aPTT 32 sec, INR 1.0, Hgb 14.2, Plts 254  Goal of Therapy:  Heparin  level 0.3-0.7 units/ml Monitor platelets by anticoagulation protocol: Yes  11/29 0648 HL 0.51, therapeutic x1 11/29 1328 HL 0.34, therapeutic x 2 11/30 0432 HL 0.35, therapeutic x 3   Plan:  Continue heparin  infusion at 1500 units/hr Check heparin  level with am labs and daily while on heparin  Continue to monitor H&H and platelets  Rankin CANDIE Dills, PharmD,  [facility] 09/19/2024 5:43 AM

## 2024-09-19 NOTE — Progress Notes (Signed)
 Progress Note   Patient: Harold [name] FMW:[id number] DOB: 1981-05-22 DOA: 09/17/2024     1 DOS: the patient was seen and examined on 09/19/2024   Brief hospital course:  Harold [name] is a 43 y.o. Caucasian male male with medical history significant for anxiety, osteoarthritis, peripheral vascular disease status post left big toe amputation, who presented to the emergency room with acute onset of worsening left foot and leg pain with associated swelling and mild erythema.  The patient is a non-smoker.  He noticed discoloration with purplish color of his left foot.  No fever or chills.  No chest pain or palpitations.  No cough or wheezing or dyspnea.  No nausea or vomiting or abdominal pain.  No bleeding diathesis.  He has been following with Dr. Jama and is apparently scheduled for left below-knee amputation on 12/2.      Assessment and Plan:   Critical limb ischemia of left lower extremity (HCC) Thromboangiitis obliterans (Buerger's disease) Left foot cellulitis Patient with a history of atherosclerosis of multiple arteries status post autologous bypass graft for his left lower extremity and now has critical limb ischemia with BKA planned for 09/22/24 who presents to the emergency room for evaluation of worsening rest pain.  Initially his pain was mostly with exertion/movement Per vascular surgery his pattern of occlusive disease was consistent with Buerger's disease.   Appreciate vascular surgery input, continue heparin  drip.  Aspirin  and Plavix  on hold for planned procedure. Continue empiric antibiotic therapy with Ancef  for cellulitis Pain control        Subjective: Continues to complain of pain in his left foot  Physical Exam: Vitals:   09/18/24 1956 09/19/24 0347 09/19/24 0505 09/19/24 0748  BP: (!) 148/81 (!) 149/111 (!) 130/91 (!) 125/96  Pulse: 60 (!) 51 (!) 51 68  Resp: 16 16  18   Temp: 97.7 F (36.5 C) 97.6 F (36.4 C)  98 F (36.7 C)  TempSrc:  Oral     SpO2: 100% 99% 98% 99%  Weight:      Height:       GENERAL:  43 y.o.-year-old patient lying in the bed with no acute distress.  EYES: Pupils equal, round, reactive to light and accommodation. No scleral icterus. Extraocular muscles intact.  HEENT: Head atraumatic, normocephalic. Oropharynx and nasopharynx clear.  NECK:  Supple, no jugular venous distention. No thyroid enlargement, no tenderness.  LUNGS: Normal breath sounds bilaterally, no wheezing, rales,rhonchi or crepitation. No use of accessory muscles of respiration.  CARDIOVASCULAR: Regular rate and rhythm, S1, S2 normal. No murmurs, rubs, or gallops.  No palpable left DP or PT pulses. ABDOMEN: Soft, nondistended, nontender. Bowel sounds present. No organomegaly or mass.  EXTREMITIES: No pedal edema, cyanosis, or clubbing.  He is status post left big toe amputation. NEUROLOGIC: Cranial nerves II through XII are intact. Muscle strength 5/5 in all extremities. Sensation intact. Gait not checked.  PSYCHIATRIC: The patient is alert and oriented x 3.  Normal affect and good eye contact. SKIN: Left dorsal foot has minimal erythema erythema and is cool with purplish discoloration.        Data Reviewed:  Labs reviewed and within normal limits  Family Communication: Plan of care discussed with patient in detail.  He verbalizes understanding and agrees with the plan  Disposition: Status is: Inpatient Remains inpatient appropriate because: For left BKA  Planned Discharge Destination: TBD    Time spent: 50 minutes  Author: Aimee Somerset, MD 09/19/2024 11:53 AM  For on call  review [email or web address].

## 2024-09-19 NOTE — Plan of Care (Signed)

## 2024-09-19 NOTE — H&P (View-Only) (Signed)
    Subjective  -   Pain is better controlled   Physical Exam:  No change in appearance of left lower extremity       Assessment/Plan:    Ischemic left leg: Continue IV antibiotics and pain control.  Plan for left below-knee amputation this week, Tuesday or Wednesday  Harold [name] 09/19/2024 5:14 PM --  Vitals:   09/19/24 0505 09/19/24 0748  BP: (!) 130/91 (!) 125/96  Pulse: (!) 51 68  Resp:  18  Temp:  98 F (36.7 C)  SpO2: 98% 99%    Intake/Output Summary (Last 24 hours) at 09/19/2024 1714 Last data filed at 09/19/2024 1536 Gross per 24 hour  Intake 1513.1 ml  Output 3050 ml  Net -1536.9 ml     Laboratory CBC    Component Value Date/Time   WBC 7.0 09/19/2024 0432   HGB 13.4 09/19/2024 0432   HCT 40.5 09/19/2024 0432   PLT 232 09/19/2024 0432    BMET    Component Value Date/Time   NA 138 09/18/2024 0648   K 4.2 09/18/2024 0648   CL 105 09/18/2024 0648   CO2 28 09/18/2024 0648   GLUCOSE 104 (H) 09/18/2024 0648   BUN 18 09/18/2024 0648   CREATININE 0.99 09/18/2024 0648   CREATININE 1.03 09/18/2015 1601   CALCIUM  8.9 09/18/2024 0648   GFRNONAA >60 09/18/2024 0648   GFRAA >60 07/21/2017 0809    COAG Lab Results  Component Value Date   INR 1.0 09/17/2024   INR 0.95 09/18/2015   INR 1.01 12/28/2012   No results found for: PTT  Antibiotics Anti-infectives (From admission, onward)    Start     Dose/Rate Route Frequency Ordered Stop   09/19/24 2200  ceFAZolin  (ANCEF ) IVPB 2g/100 mL premix        2 g 200 mL/hr over 30 Minutes Intravenous Every 8 hours 09/19/24 1258     09/18/24 1300  vancomycin (VANCOREADY) IVPB 1250 mg/250 mL  Status:  Discontinued        1,250 mg 166.7 mL/hr over 90 Minutes Intravenous Every 12 hours 09/18/24 1124 09/19/24 1221   09/18/24 0015  vancomycin (VANCOREADY) IVPB 2000 mg/400 mL        2,000 mg 200 mL/hr over 120 Minutes Intravenous  Once 09/18/24 0002 09/18/24 0347   09/18/24 0000  vancomycin (VANCOCIN)  IVPB 1000 mg/200 mL premix  Status:  Discontinued        1,000 mg 200 mL/hr over 60 Minutes Intravenous  Once 09/17/24 2358 09/18/24 0002   09/18/24 0000  ceFEPIme (MAXIPIME) 2 g in sodium chloride  0.9 % 100 mL IVPB        2 g 200 mL/hr over 30 Minutes Intravenous  Once 09/17/24 2358 09/18/24 0059

## 2024-09-19 NOTE — Progress Notes (Signed)
    Subjective  -   Pain is better controlled   Physical Exam:  No change in appearance of left lower extremity       Assessment/Plan:    Ischemic left leg: Continue IV antibiotics and pain control.  Plan for left below-knee amputation this week, Tuesday or Wednesday  Harold [name] 09/19/2024 5:14 PM --  Vitals:   09/19/24 0505 09/19/24 0748  BP: (!) 130/91 (!) 125/96  Pulse: (!) 51 68  Resp:  18  Temp:  98 F (36.7 C)  SpO2: 98% 99%    Intake/Output Summary (Last 24 hours) at 09/19/2024 1714 Last data filed at 09/19/2024 1536 Gross per 24 hour  Intake 1513.1 ml  Output 3050 ml  Net -1536.9 ml     Laboratory CBC    Component Value Date/Time   WBC 7.0 09/19/2024 0432   HGB 13.4 09/19/2024 0432   HCT 40.5 09/19/2024 0432   PLT 232 09/19/2024 0432    BMET    Component Value Date/Time   NA 138 09/18/2024 0648   K 4.2 09/18/2024 0648   CL 105 09/18/2024 0648   CO2 28 09/18/2024 0648   GLUCOSE 104 (H) 09/18/2024 0648   BUN 18 09/18/2024 0648   CREATININE 0.99 09/18/2024 0648   CREATININE 1.03 09/18/2015 1601   CALCIUM  8.9 09/18/2024 0648   GFRNONAA >60 09/18/2024 0648   GFRAA >60 07/21/2017 0809    COAG Lab Results  Component Value Date   INR 1.0 09/17/2024   INR 0.95 09/18/2015   INR 1.01 12/28/2012   No results found for: PTT  Antibiotics Anti-infectives (From admission, onward)    Start     Dose/Rate Route Frequency Ordered Stop   09/19/24 2200  ceFAZolin  (ANCEF ) IVPB 2g/100 mL premix        2 g 200 mL/hr over 30 Minutes Intravenous Every 8 hours 09/19/24 1258     09/18/24 1300  vancomycin (VANCOREADY) IVPB 1250 mg/250 mL  Status:  Discontinued        1,250 mg 166.7 mL/hr over 90 Minutes Intravenous Every 12 hours 09/18/24 1124 09/19/24 1221   09/18/24 0015  vancomycin (VANCOREADY) IVPB 2000 mg/400 mL        2,000 mg 200 mL/hr over 120 Minutes Intravenous  Once 09/18/24 0002 09/18/24 0347   09/18/24 0000  vancomycin (VANCOCIN)  IVPB 1000 mg/200 mL premix  Status:  Discontinued        1,000 mg 200 mL/hr over 60 Minutes Intravenous  Once 09/17/24 2358 09/18/24 0002   09/18/24 0000  ceFEPIme (MAXIPIME) 2 g in sodium chloride  0.9 % 100 mL IVPB        2 g 200 mL/hr over 30 Minutes Intravenous  Once 09/17/24 2358 09/18/24 0059

## 2024-09-20 LAB — CREATININE, SERUM
Creatinine, Ser: 0.74 mg/dL (ref 0.61–1.24)
GFR, Estimated: >60 mL/min (ref >60)

## 2024-09-20 LAB — CBC
HCT: 42.7 % (ref 39.0–52.0)
Hemoglobin: 14.3 g/dL (ref 13.0–17.0)
MCH: 29.3 pg (ref 26.0–34.0)
MCHC: 33.5 g/dL (ref 30.0–36.0)
MCV: 87.5 fL (ref 80.0–100.0)
Platelets: 247 K/uL (ref 150–400)
RBC: 4.88 MIL/uL (ref 4.22–5.81)
RDW: 13.1 % (ref 11.5–15.5)
WBC: 6.4 K/uL (ref 4.0–10.5)
nRBC: 0 % (ref 0.0–0.2)

## 2024-09-20 LAB — HEPARIN LEVEL (UNFRACTIONATED)
Heparin Unfractionated: 0.22 [IU]/mL — ABNORMAL LOW (ref 0.30–0.70)
Heparin Unfractionated: 0.33 [IU]/mL (ref 0.30–0.70)
Heparin Unfractionated: 0.32 [IU]/mL (ref 0.30–0.70)

## 2024-09-20 MED ORDER — CHLORHEXIDINE GLUCONATE 4 % EX SOLN
60.0000 mL | Freq: Once | CUTANEOUS | Status: AC
  Administered 2024-09-21: 4 via TOPICAL

## 2024-09-20 MED ORDER — CHLORHEXIDINE GLUCONATE 4 % EX SOLN
60.0000 mL | Freq: Once | CUTANEOUS | Status: AC
  Administered 2024-09-20: 4 via TOPICAL

## 2024-09-20 MED ORDER — HEPARIN BOLUS VIA INFUSION
1300.0000 [IU] | Freq: Once | INTRAVENOUS | Status: AC
  Administered 2024-09-20: 1300 [IU] via INTRAVENOUS
  Filled 2024-09-20: qty 1300

## 2024-09-20 MED ORDER — SODIUM CHLORIDE 0.9 % IV SOLN: INTRAVENOUS | Status: AC

## 2024-09-20 NOTE — Consult Note (Signed)
 PHARMACY - ANTICOAGULATION CONSULT NOTE  Pharmacy Consult for Heparin  Indication: limb ischemia  No Known Allergies  Patient Measurements: Height: 6' (182.9 cm) Weight: 89.9 kg (198 lb 3.1 oz) IBW/kg (Calculated) : 77.6 HEPARIN  DW (KG): 89.9  Vital Signs: Temp: 97.8 F (36.6 C) (12/01 0442) Temp Source: Oral (12/01 0442) BP: 136/95 (12/01 0442) Pulse Rate: 64 (12/01 0442)  Labs: Recent Labs    09/17/24 2257 09/17/24 2257 09/18/24 0648 09/18/24 1328 09/19/24 0432 09/20/24 0440  HGB 14.2  --  14.0  --  13.4 14.3  HCT 42.9  --  42.1  --  40.5 42.7  PLT 254  --  223  --  232 247  APTT 32  --   --   --   --   --   LABPROT 13.6  --   --   --   --   --   INR 1.0  --   --   --   --   --   HEPARINUNFRC  --    < > 0.51 0.34 0.35 0.22*  CREATININE 1.12  --  0.99  --   --  0.74   < > = values in this interval not displayed.    Estimated Creatinine Clearance: 129.3 mL/min (by C-G formula based on SCr of 0.74 mg/dL).   Medical History: Past Medical History:  Diagnosis Date   Anxiety    Arthritis    Back pain    Peripheral vascular disease    Shingles     Medications:  No history of chronic anticoagulant use PTA  Assessment: 43 y.o. male whose medical history includes atherosclerosis of multiple arteries leading to multiple surgeries including autologous bypass graft years ago by Dr. Jama for the left leg.  He has known critical limb ischemia and is planned for a below-knee amputation of the left leg by Dr. Jama next week. Per patient, the swelling and redness of his left foot has worsened. Started on heparin  infusion. Vascular to see patient and assess left leg/foot.  Baseline labs: aPTT 32 sec, INR 1.0, Hgb 14.2, Plts 254  Goal of Therapy:  Heparin  level 0.3-0.7 units/ml Monitor platelets by anticoagulation protocol: Yes  11/29 0648 HL 0.51, therapeutic x1 11/29 1328 HL 0.34, therapeutic x 2 11/30 0432 HL 0.35, therapeutic x 3 12/01 0440 HL 0.22,  subtherapeutic   Plan:  Bolus 1300 units x 1 Increase heparin  infusion to 1700 units/hr Recheck heparin  level in 6 hrs after rate change Continue to monitor H&H and platelets  Rankin CANDIE Dills, PharmD, [facility] 09/20/2024 6:52 AM

## 2024-09-20 NOTE — TOC CM/SW Note (Signed)
 Transition of Care [facility]) CM/SW Note    Transition of Care [facility]) - Inpatient Brief Assessment   Patient Details  Name: MERLAND HOLNESS MRN: [id number] Date of Birth: [birth year]  [facility] of [facility]) CM/SW Contact:    Alfonso Rummer, LCSW Phone Number: 09/20/2024, 5:43 PM   Clinical Narrative:  Completed toc chart review. No TOC needs at this time please contact should needs arise   Transition of Care Asessment: Insurance and Status: Insurance coverage has been reviewed Patient has primary care physician: Yes (PODRAZA, COLE CHRISTOPHER) Home environment has been reviewed: single famil home Prior level of function:: independen t Prior/Current Home Services: No current home services Social Drivers of Health Review: SDOH reviewed no interventions necessary   Transition of care needs: no transition of care needs at this time

## 2024-09-20 NOTE — Progress Notes (Signed)
  Progress Note    09/20/2024 4:36 PM * No surgery found *  Subjective:  Harold [name] is a 43 y.o. male, who has an ischemic left foot.  His disease has been felt to be related to Buerger's.  He is no longer smoking.  He has exhausted options for revascularization and has been scheduled for left below-knee amputation next week.  His wife called stating that he is having worsening pain and redness on top of his foot.  He denies any fevers or drainage.  He was encouraged to come to the emergency department.   Upon exam today patient agrees to left below the knee amputation.  He does not have any other questions related to the procedure today.  Plan is to take him to the operating room tomorrow on 09/21/2024.   There were no vitals filed for this visit. Physical Exam: Cardiac:  RRR, normal S1 and S2, no murmurs appreciated. Lungs: Lungs clear throughout on auscultation.  Normal labored breathing.  No rales rhonchi or wheezing. Incisions: None Extremities: Bilateral lower extremities warm to touch with exception of left lower extremity from mid calf to foot.  Noted ischemia patient is here for lower extremity amputation. Abdomen: Positive bowel sounds throughout, soft, nontender nondistended. Neurologic: Alert and oriented x 3, answers all questions and follows commands appropriately.  CBC    Component Value Date/Time   WBC 6.4 09/20/2024 0440   RBC 4.88 09/20/2024 0440   HGB 14.3 09/20/2024 0440   HCT 42.7 09/20/2024 0440   PLT 247 09/20/2024 0440   MCV 87.5 09/20/2024 0440   MCH 29.3 09/20/2024 0440   MCHC 33.5 09/20/2024 0440   RDW 13.1 09/20/2024 0440   LYMPHSABS 1.6 09/17/2024 2257   MONOABS 0.8 09/17/2024 2257   EOSABS 0.2 09/17/2024 2257   BASOSABS 0.1 09/17/2024 2257    BMET    Component Value Date/Time   NA 138 09/18/2024 0648   K 4.2 09/18/2024 0648   CL 105 09/18/2024 0648   CO2 28 09/18/2024 0648   GLUCOSE 104 (H) 09/18/2024 0648   BUN 18 09/18/2024 0648    CREATININE 0.74 09/20/2024 0440   CREATININE 1.03 09/18/2015 1601   CALCIUM  8.9 09/18/2024 0648   GFRNONAA >60 09/20/2024 0440   GFRAA >60 07/21/2017 0809    INR    Component Value Date/Time   INR 1.0 09/17/2024 2257    No intake or output data in the 24 hours ending 09/20/24 1636   Assessment/Plan:  43 y.o. male is s/p left lower extremity angiogram with angioplasty to the left anterior tibial artery, left popliteal artery and left distal SFA.* No surgery found *  PLAN Patient returns to the emergency room due to increased pain and discomfort.  Patient was started on IV narcotics to help as much better today.  Plan is to take the patient to the operating room tomorrow on 09/21/2024 for left below the knee amputation. I discussed the procedure in detail with the patient today and he verbalizes understanding.  We also discussed the risks, benefits, and complications.  He verbalizes understanding wishes to proceed.  Patient will be made n.p.o. after midnight tonight.  Patient remains on a heparin  infusion prior to surgery.   DVT prophylaxis: Heparin  infusion   Gwendlyn JONELLE Shank [facility] 09/20/2024 4:36 PM

## 2024-09-20 NOTE — Progress Notes (Signed)
 [facility]  -  at [facility]   PATIENT NAME: Harold [name]    MR#:  [id number]  DATE OF BIRTH:  [birth year]  SUBJECTIVE:  no family at bedside. Patient came in with ongoing pain left foot. Currently on IV heparin  drip and IV pain meds. Vascular surgery plans for left BKA tomorrow    VITALS:  Blood pressure (!) 136/95, pulse 64, temperature 97.8 F (36.6 C), temperature source Oral, resp. rate 20, height 6' (1.829 m), weight 89.9 kg, SpO2 97%.  PHYSICAL EXAMINATION:   GENERAL:  43 y.o.-year-old patient with no acute distress.  LUNGS: Normal breath sounds bilaterally, no wheezing CARDIOVASCULAR: S1, S2 normal. No murmur   ABDOMEN: Soft, nontender, nondistended. Bowel sounds present.  EXTREMITIES:  NEUROLOGIC: nonfocal  patient is alert and awake   LABORATORY PANEL:  CBC Recent Labs  Lab 09/20/24 0440  WBC 6.4  HGB 14.3  HCT 42.7  PLT 247    Chemistries  Recent Labs  Lab 09/17/24 2257 09/18/24 0648 09/20/24 0440  NA 139 138  --   K 4.2 4.2  --   CL 102 105  --   CO2 29 28  --   GLUCOSE 93 104*  --   BUN 20 18  --   CREATININE 1.12 0.99 0.74  CALCIUM  9.7 8.9  --   AST 27  --   --   ALT 39  --   --   ALKPHOS 173*  --   --   BILITOT 0.4  --   --    Assessment and Plan  Harold [name] is a 43 y.o. Caucasian male male with medical history significant for anxiety, osteoarthritis, peripheral vascular disease status post left big toe amputation, who presented to the emergency room with acute onset of worsening left foot and leg pain with associated swelling and mild erythema.   He has been following with Dr. Jama and is apparently scheduled for left below-knee amputation on 12/  Critical limb ischemia of left lower extremity (HCC) Thromboangiitis obliterans (Buerger's disease) Left foot cellulitis --Patient with a history of atherosclerosis of multiple arteries status post autologous bypass graft for his left lower extremity and now  has critical limb ischemia with BKA planned for 09/21/24 who presents to the emergency room for evaluation of worsening rest pain.  Initially his pain was mostly with exertion/movement --Per vascular surgery his pattern of occlusive disease was consistent with Buerger's disease.   --Appreciate vascular surgery input, continue heparin  drip.  Aspirin  and Plavix  on hold for planned procedure. --Continue empiric antibiotic therapy with Ancef  for cellulitis --Pain control    Procedures: Family communication :none Consults : vascular surgery CODE STATUS: full DVT Prophylaxis : heparin  drip Level of care: Telemetry Status is: Inpatient Remains inpatient appropriate because: Ischemic foot    TOTAL TIME TAKING CARE OF THIS PATIENT: 35 minutes.  >50% time spent on counselling and coordination of care  Note: This dictation was prepared with Dragon dictation along with smaller phrase technology. Any transcriptional errors that result from this process are unintentional.  Leita Blanch M.D    [facility]   CC: Primary care physician; Podraza, Cole Christopher, PA-C

## 2024-09-20 NOTE — Consult Note (Signed)
 PHARMACY - ANTICOAGULATION CONSULT NOTE  Pharmacy Consult for Heparin  Indication: limb ischemia  No Known Allergies  Patient Measurements: Height: 6' (182.9 cm) Weight: 89.9 kg (198 lb 3.1 oz) IBW/kg (Calculated) : 77.6 HEPARIN  DW (KG): 89.9  Vital Signs: Temp: 97.8 F (36.6 C) (12/01 0442) Temp Source: Oral (12/01 0442) BP: 136/95 (12/01 0442) Pulse Rate: 64 (12/01 0442)  Labs: Recent Labs    09/17/24 2257 09/18/24 0648 09/18/24 1328 09/19/24 0432 09/20/24 0440 09/20/24 1314  HGB 14.2 14.0  --  13.4 14.3  --   HCT 42.9 42.1  --  40.5 42.7  --   PLT 254 223  --  232 247  --   APTT 32  --   --   --   --   --   LABPROT 13.6  --   --   --   --   --   INR 1.0  --   --   --   --   --   HEPARINUNFRC  --  0.51   < > 0.35 0.22* 0.33  CREATININE 1.12 0.99  --   --  0.74  --    < > = values in this interval not displayed.    Estimated Creatinine Clearance: 129.3 mL/min (by C-G formula based on SCr of 0.74 mg/dL).   Medical History: Past Medical History:  Diagnosis Date   Anxiety    Arthritis    Back pain    Peripheral vascular disease    Shingles     Medications:  No history of chronic anticoagulant use PTA  Assessment: 43 y.o. male whose medical history includes atherosclerosis of multiple arteries leading to multiple surgeries including autologous bypass graft years ago by Dr. Jama for the left leg.  He has known critical limb ischemia and is planned for a below-knee amputation of the left leg by Dr. Jama next week. Per patient, the swelling and redness of his left foot has worsened. Started on heparin  infusion. Vascular to see patient and assess left leg/foot.  Baseline labs: aPTT 32 sec, INR 1.0, Hgb 14.2, Plts 254  Goal of Therapy:  Heparin  level 0.3-0.7 units/ml Monitor platelets by anticoagulation protocol: Yes  11/29 0648 HL 0.51, therapeutic x1 11/29 1328 HL 0.34, therapeutic x 2 11/30 0432 HL 0.35, therapeutic x 3 12/01 0440 HL 0.22,  subtherapeutic 12/01 1314 HL 0.33, therapeutic x 1   Plan:  HL on lower end of therapeutic Increase heparin  infusion slightly to 1800 units/hr Recheck heparin  level in 6 hrs after rate change Continue to monitor H&H and platelets  Idolina DELENA Percy, PharmD Clinical Pharmacist 09/20/2024 2:14 PM

## 2024-09-20 NOTE — Plan of Care (Signed)

## 2024-09-20 NOTE — Consult Note (Signed)
 PHARMACY - ANTICOAGULATION CONSULT NOTE  Pharmacy Consult for Heparin  Indication: limb ischemia  No Known Allergies  Patient Measurements: Height: 6' (182.9 cm) Weight: 89.9 kg (198 lb 3.1 oz) IBW/kg (Calculated) : 77.6 HEPARIN  DW (KG): 89.9  Vital Signs: Temp: 98.3 F (36.8 C) (12/01 2001) BP: 117/77 (12/01 2001) Pulse Rate: 69 (12/01 2001)  Labs: Recent Labs    09/17/24 2257 09/18/24 0648 09/18/24 1328 09/19/24 0432 09/20/24 0440 09/20/24 1314 09/20/24 2103  HGB 14.2 14.0  --  13.4 14.3  --   --   HCT 42.9 42.1  --  40.5 42.7  --   --   PLT 254 223  --  232 247  --   --   APTT 32  --   --   --   --   --   --   LABPROT 13.6  --   --   --   --   --   --   INR 1.0  --   --   --   --   --   --   HEPARINUNFRC  --  0.51   < > 0.35 0.22* 0.33 0.32  CREATININE 1.12 0.99  --   --  0.74  --   --    < > = values in this interval not displayed.    Estimated Creatinine Clearance: 129.3 mL/min (by C-G formula based on SCr of 0.74 mg/dL).   Medical History: Past Medical History:  Diagnosis Date   Anxiety    Arthritis    Back pain    Peripheral vascular disease    Shingles     Medications:  No history of chronic anticoagulant use PTA  Assessment: 43 y.o. male whose medical history includes atherosclerosis of multiple arteries leading to multiple surgeries including autologous bypass graft years ago by Dr. Jama for the left leg.  He has known critical limb ischemia and is planned for a below-knee amputation of the left leg by Dr. Jama next week. Per patient, the swelling and redness of his left foot has worsened. Started on heparin  infusion. Vascular to see patient and assess left leg/foot.  Baseline labs: aPTT 32 sec, INR 1.0, Hgb 14.2, Plts 254  Goal of Therapy:  Heparin  level 0.3-0.7 units/ml Monitor platelets by anticoagulation protocol: Yes  11/29 0648 HL 0.51, therapeutic x1 11/29 1328 HL 0.34, therapeutic x 2 11/30 0432 HL 0.35, therapeutic x 3 12/01  0440 HL 0.22, subtherapeutic 12/01 1314 HL 0.33, therapeutic 12/01 2103 HL 0.32, therapeutic x 1 at rate 1800 un/hr   Plan: Continue heparin  infusion at 1800 units/hr Recheck confirmatory heparin  level in 6 hours Continue to monitor H&H and platelets daily while on heparin   Will M. Lenon, PharmD, BCPS Clinical Pharmacist 09/20/2024 9:41 PM

## 2024-09-21 ENCOUNTER — Inpatient Hospital Stay: Admitting: Anesthesiology

## 2024-09-21 ENCOUNTER — Inpatient Hospital Stay: Admission: RE | Admit: 2024-09-21 | Source: Ambulatory Visit

## 2024-09-21 ENCOUNTER — Encounter: Payer: Self-pay | Admitting: Family Medicine

## 2024-09-21 DIAGNOSIS — I739 Peripheral vascular disease, unspecified: Secondary | ICD-10-CM | POA: Diagnosis present

## 2024-09-21 HISTORY — PX: AMPUTATION: SHX166

## 2024-09-21 LAB — CBC
HCT: 41.1 % (ref 39.0–52.0)
Hemoglobin: 14 g/dL (ref 13.0–17.0)
MCH: 29.7 pg (ref 26.0–34.0)
MCHC: 34.1 g/dL (ref 30.0–36.0)
MCV: 87.1 fL (ref 80.0–100.0)
Platelets: 230 K/uL (ref 150–400)
RBC: 4.72 MIL/uL (ref 4.22–5.81)
RDW: 13.1 % (ref 11.5–15.5)
WBC: 6.7 K/uL (ref 4.0–10.5)
nRBC: 0 % (ref 0.0–0.2)

## 2024-09-21 LAB — HEPARIN LEVEL (UNFRACTIONATED): Heparin Unfractionated: 0.42 [IU]/mL (ref 0.30–0.70)

## 2024-09-21 MED ORDER — OXYCODONE HCL 5 MG PO TABS: 5.0000 mg | ORAL_TABLET | Freq: Once | ORAL | Status: DC | PRN

## 2024-09-21 MED ORDER — HYDROMORPHONE HCL 1 MG/ML IJ SOLN
INTRAMUSCULAR | Status: DC | PRN
  Administered 2024-09-21 (×2): .5 mg via INTRAVENOUS

## 2024-09-21 MED ORDER — PROPOFOL 10 MG/ML IV BOLUS
INTRAVENOUS | Status: DC | PRN
  Administered 2024-09-21: 40 mg via INTRAVENOUS
  Administered 2024-09-21: 100 mg via INTRAVENOUS
  Administered 2024-09-21 (×2): 50 mg via INTRAVENOUS
  Administered 2024-09-21: 200 mg via INTRAVENOUS
  Administered 2024-09-21: 30 mg via INTRAVENOUS
  Administered 2024-09-21 (×2): 50 mg via INTRAVENOUS

## 2024-09-21 MED ORDER — OXYCODONE HCL 5 MG PO TABS
ORAL_TABLET | ORAL | Status: AC
  Filled 2024-09-21: qty 2

## 2024-09-21 MED ORDER — MIDAZOLAM HCL 2 MG/2ML IJ SOLN
INTRAMUSCULAR | Status: AC
  Filled 2024-09-21: qty 2

## 2024-09-21 MED ORDER — KETAMINE HCL 50 MG/5ML IJ SOSY
PREFILLED_SYRINGE | INTRAMUSCULAR | Status: AC
  Filled 2024-09-21: qty 5

## 2024-09-21 MED ORDER — KETAMINE HCL 50 MG/5ML IJ SOSY
PREFILLED_SYRINGE | INTRAMUSCULAR | Status: DC | PRN
  Administered 2024-09-21: 10 mg via INTRAVENOUS
  Administered 2024-09-21: 20 mg via INTRAVENOUS

## 2024-09-21 MED ORDER — DEXMEDETOMIDINE HCL IN NACL 80 MCG/20ML IV SOLN
INTRAVENOUS | Status: DC | PRN
  Administered 2024-09-21: 4 ug via INTRAVENOUS
  Administered 2024-09-21 (×2): 8 ug via INTRAVENOUS

## 2024-09-21 MED ORDER — DEXMEDETOMIDINE HCL IN NACL 80 MCG/20ML IV SOLN
INTRAVENOUS | Status: AC
  Filled 2024-09-21: qty 20

## 2024-09-21 MED ORDER — CEFAZOLIN SODIUM-DEXTROSE 2-4 GM/100ML-% IV SOLN
INTRAVENOUS | Status: AC
  Filled 2024-09-21: qty 100

## 2024-09-21 MED ORDER — ACETAMINOPHEN 10 MG/ML IV SOLN
INTRAVENOUS | Status: DC | PRN
  Administered 2024-09-21: 1000 mg via INTRAVENOUS

## 2024-09-21 MED ORDER — HYDROMORPHONE HCL 1 MG/ML IJ SOLN
1.0000 mg | INTRAMUSCULAR | Status: DC | PRN
  Administered 2024-09-21 – 2024-09-22 (×4): 1 mg via INTRAVENOUS
  Filled 2024-09-21 (×4): qty 1

## 2024-09-21 MED ORDER — HYDROMORPHONE HCL 1 MG/ML IJ SOLN
INTRAMUSCULAR | Status: AC
  Filled 2024-09-21: qty 1

## 2024-09-21 MED ORDER — LACTATED RINGERS IV SOLN: INTRAVENOUS | Status: DC | PRN

## 2024-09-21 MED ORDER — ACETAMINOPHEN 10 MG/ML IV SOLN: 1000.0000 mg | Freq: Once | INTRAVENOUS | Status: DC | PRN

## 2024-09-21 MED ORDER — SODIUM CHLORIDE FLUSH 0.9 % IV SOLN
INTRAVENOUS | Status: DC | PRN
  Administered 2024-09-21: 100 mL

## 2024-09-21 MED ORDER — PROPOFOL 10 MG/ML IV BOLUS
INTRAVENOUS | Status: AC
  Filled 2024-09-21: qty 20

## 2024-09-21 MED ORDER — LIDOCAINE HCL (PF) 2 % IJ SOLN
INTRAMUSCULAR | Status: AC
  Filled 2024-09-21: qty 5

## 2024-09-21 MED ORDER — DROPERIDOL 2.5 MG/ML IJ SOLN: 0.6250 mg | Freq: Once | INTRAMUSCULAR | Status: DC | PRN

## 2024-09-21 MED ORDER — FENTANYL CITRATE (PF) 100 MCG/2ML IJ SOLN
INTRAMUSCULAR | Status: DC | PRN
  Administered 2024-09-21 (×2): 50 ug via INTRAVENOUS

## 2024-09-21 MED ORDER — ONDANSETRON HCL 4 MG/2ML IJ SOLN
INTRAMUSCULAR | Status: DC | PRN
  Administered 2024-09-21: 4 mg via INTRAVENOUS

## 2024-09-21 MED ORDER — OXYCODONE HCL 5 MG PO TABS
5.0000 mg | ORAL_TABLET | ORAL | Status: DC | PRN
  Administered 2024-09-21 – 2024-09-22 (×4): 10 mg via ORAL
  Filled 2024-09-21 (×3): qty 2

## 2024-09-21 MED ORDER — HYDROMORPHONE HCL 1 MG/ML IJ SOLN
0.5000 mg | INTRAMUSCULAR | Status: AC | PRN
  Administered 2024-09-21 (×4): 0.5 mg via INTRAVENOUS

## 2024-09-21 MED ORDER — BUPIVACAINE LIPOSOME 1.3 % IJ SUSP
INTRAMUSCULAR | Status: AC
  Filled 2024-09-21: qty 20

## 2024-09-21 MED ORDER — FENTANYL CITRATE (PF) 100 MCG/2ML IJ SOLN
25.0000 ug | INTRAMUSCULAR | Status: DC | PRN
  Administered 2024-09-21 (×4): 25 ug via INTRAVENOUS

## 2024-09-21 MED ORDER — VASHE WOUND IRRIGATION OPTIME
TOPICAL | Status: DC | PRN
  Administered 2024-09-21: 34 [oz_av] via TOPICAL

## 2024-09-21 MED ORDER — ACETAMINOPHEN 10 MG/ML IV SOLN
INTRAVENOUS | Status: AC
  Filled 2024-09-21: qty 100

## 2024-09-21 MED ORDER — SODIUM CHLORIDE (PF) 0.9 % IJ SOLN
INTRAMUSCULAR | Status: AC
  Filled 2024-09-21: qty 50

## 2024-09-21 MED ORDER — OXYCODONE HCL 5 MG/5ML PO SOLN: 5.0000 mg | Freq: Once | ORAL | Status: DC | PRN

## 2024-09-21 MED ORDER — FENTANYL CITRATE (PF) 100 MCG/2ML IJ SOLN
INTRAMUSCULAR | Status: AC
  Filled 2024-09-21: qty 2

## 2024-09-21 MED ORDER — MIDAZOLAM HCL (PF) 2 MG/2ML IJ SOLN
INTRAMUSCULAR | Status: DC | PRN
  Administered 2024-09-21: 2 mg via INTRAVENOUS

## 2024-09-21 MED ORDER — DEXAMETHASONE SOD PHOSPHATE PF 10 MG/ML IJ SOLN
INTRAMUSCULAR | Status: DC | PRN
  Administered 2024-09-21: 10 mg via INTRAVENOUS

## 2024-09-21 MED ORDER — BUPIVACAINE HCL (PF) 0.5 % IJ SOLN
INTRAMUSCULAR | Status: AC
  Filled 2024-09-21: qty 30

## 2024-09-21 MED ORDER — LIDOCAINE HCL (CARDIAC) PF 100 MG/5ML IV SOSY
PREFILLED_SYRINGE | INTRAVENOUS | Status: DC | PRN
  Administered 2024-09-21: 100 mg via INTRAVENOUS

## 2024-09-21 MED ORDER — ONDANSETRON HCL 4 MG/2ML IJ SOLN
INTRAMUSCULAR | Status: AC
  Filled 2024-09-21: qty 2

## 2024-09-21 MED ORDER — 0.9 % SODIUM CHLORIDE (POUR BTL) OPTIME
TOPICAL | Status: DC | PRN
  Administered 2024-09-21: 500 mL

## 2024-09-21 NOTE — Transfer of Care (Signed)
 Immediate Anesthesia Transfer of Care Note  Patient: Harold [name]  Procedure(s) Performed: AMPUTATION BELOW KNEE (Left: Knee)  Patient Location: PACU  Anesthesia Type:General  Level of Consciousness: drowsy  Airway & Oxygen Therapy: Patient Spontanous Breathing and Patient connected to face mask oxygen  Post-op Assessment: Report given to RN and Post -op Vital signs reviewed and stable  Post vital signs: Reviewed and stable  Last Vitals:  Vitals Value Taken Time  BP 113/78 09/21/24 16:25  Temp    Pulse 59 09/21/24 16:27  Resp 19 09/21/24 16:27  SpO2 98 % 09/21/24 16:27  Vitals shown include unfiled device data.  Last Pain:  Vitals:   09/21/24 1157  TempSrc: Temporal  PainSc: 6       Patients Stated Pain Goal: 3 (09/18/24 0100)  Complications: No notable events documented.

## 2024-09-21 NOTE — Progress Notes (Signed)
 [facility]  - [place] at [facility]   PATIENT NAME: Harold [name]    MR#:  [id number]  DATE OF BIRTH:  08/10/1981  SUBJECTIVE:  no family at bedside. Patient came in with ongoing pain left foot. Currently on IV heparin  drip and IV pain meds. Vascular surgery plans for left BKA today    VITALS:  Blood pressure (!) 137/96, pulse 65, temperature 98.1 F (36.7 C), temperature source Temporal, resp. rate 17, height 6' (1.829 m), weight 89.9 kg, SpO2 97%.  PHYSICAL EXAMINATION:   GENERAL:  43 y.o.-year-old patient with no acute distress.  LUNGS: Normal breath sounds bilaterally, no wheezing CARDIOVASCULAR: S1, S2 normal. No murmur   ABDOMEN: Soft, nontender, nondistended. Bowel sounds present.  EXTREMITIES:  NEUROLOGIC: nonfocal  patient is alert and awake   LABORATORY PANEL:  CBC Recent Labs  Lab 09/21/24 0411  WBC 6.7  HGB 14.0  HCT 41.1  PLT 230    Chemistries  Recent Labs  Lab 09/17/24 2257 09/18/24 0648 09/20/24 0440  NA 139 138  --   K 4.2 4.2  --   CL 102 105  --   CO2 29 28  --   GLUCOSE 93 104*  --   BUN 20 18  --   CREATININE 1.12 0.99 0.74  CALCIUM  9.7 8.9  --   AST 27  --   --   ALT 39  --   --   ALKPHOS 173*  --   --   BILITOT 0.4  --   --    Assessment and Plan  LISSANDRO DILORENZO is a 43 y.o. Caucasian male male with medical history significant for anxiety, osteoarthritis, peripheral vascular disease status post left big toe amputation, who presented to the emergency room with acute onset of worsening left foot and leg pain with associated swelling and mild erythema.   He has been following with Dr. Jama and is apparently scheduled for left below-knee amputation on 12/  Critical limb ischemia of left lower extremity (HCC) Thromboangiitis obliterans (Buerger's disease) Left foot cellulitis --Patient with a history of atherosclerosis of multiple arteries status post autologous bypass graft for his left lower extremity and now  has critical limb ischemia with BKA planned for 09/21/24 who presents to the emergency room for evaluation of worsening rest pain.  Initially his pain was mostly with exertion/movement --Per vascular surgery his pattern of occlusive disease was consistent with Buerger's disease.   --Appreciate vascular surgery input, continue heparin  drip.  Aspirin  and Plavix  on hold for planned procedure. --Continue empiric antibiotic therapy with Ancef  for cellulitis --Pain control    Procedures: Family communication :none Consults : vascular surgery CODE STATUS: full DVT Prophylaxis : heparin  drip Level of care: Telemetry Status is: Inpatient Remains inpatient appropriate because: Ischemic foot    TOTAL TIME TAKING CARE OF THIS PATIENT: 35 minutes.  >50% time spent on counselling and coordination of care  Note: This dictation was prepared with Dragon dictation along with smaller phrase technology. Any transcriptional errors that result from this process are unintentional.  Leita Blanch M.D    [facility]   CC: Primary care physician; Podraza, Cole Christopher, PA-C

## 2024-09-21 NOTE — Plan of Care (Signed)

## 2024-09-21 NOTE — Op Note (Signed)
   OPERATIVE NOTE   PROCEDURE: Left below-the-knee amputation  PRE-OPERATIVE DIAGNOSIS: Left foot gangrene  POST-OPERATIVE DIAGNOSIS: same as above  SURGEON: Cordella JUDITHANN Shawl, MD  ASSISTANT(S): none  ANESTHESIA: general  ESTIMATED BLOOD LOSS: 150 cc  FINDING(S): Healthy appearing muscle bellies  SPECIMEN(S):  Left below-the-knee amputation  INDICATIONS:   Harold [name] is a 43 y.o. male who presents with left leg gangrene.  The patient is scheduled for a left below-the-knee amputation.  I discussed in depth with the patient the risks, benefits, and alternatives to this procedure.  The patient is aware that the risk of this operation included but are not limited to:  bleeding, infection, myocardial infarction, stroke, death, failure to heal amputation wound, and possible need for more proximal amputation.  The patient is aware of the risks and agrees proceed forward with the procedure.  DESCRIPTION:  After full informed written consent was obtained from the patient, the patient was brought back to the operating room, and placed supine upon the operating table.  Prior to induction, the patient received IV antibiotics.  The patient was then prepped and draped in the standard fashion for a below-the-knee amputation.  After obtaining adequate anesthesia, the patient was prepped and draped in the standard fashion for a left below-the-knee amputation.  I marked out the anterior incision two finger breadths below the tibial tuberosity and then the marked out a posterior flap that was one third of the circumference of the calf in length.   I made the incisions for these flaps, and then dissected through the subcutaneous tissue, fascia, and muscle anteriorly.  I elevated  the periosteal tissue superiorly so that the tibia was about 3-4 cm shorter than the anterior skin flap.  I then transected the tibia with a power saw and then took a wedge off the tibia anteriorly with the power saw.  Then I  smoothed out the rough edges.  In a similar fashion, I cut back the fibula about two centimeters higher than the level of the tibia with a bone cutter.  I put a bone hook into the distal tibia and then used a large amputation knife to sharply develop a tissue plane through the muscle along the fibula.  In such fashion, the posterior flap was developed.  At this point, the specimen was passed off the field as the below-the-knee amputation.  At this point, I clamped all visibly bleeding arteries and veins using a combination of suture ligation with Silk suture and electrocautery.  Bleeding continued to be controlled with electrocautery and suture ligature.  The stump was washed off with sterile normal saline and no further active bleeding was noted.  I reapproximated the anterior and posterior fascia  with interrupted stitches of 0 Vicryl.  This was completed along the entire length of anterior and posterior fascia until there were no more loose space in the fascial line. I then placed a layer of 2-0 Vicryl sutures in the subcutaneous tissue. The skin was then  reapproximated with staples.  The stump was washed off and dried.  The incision was dressed with Xeroform and  then fluffs were applied.  Kerlix was wrapped around the leg and then gently an ACE wrap was applied.    COMPLICATIONS: none  CONDITION: stable   Cordella Shawl  09/21/2024, 4:35 PM    This note was created with Dragon Medical transcription system. Any errors in dictation are purely unintentional.

## 2024-09-21 NOTE — Anesthesia Postprocedure Evaluation (Signed)
 Anesthesia Post Note  Patient: Harold [name]  Procedure(s) Performed: AMPUTATION BELOW KNEE (Left: Knee)  Patient location during evaluation: PACU Anesthesia Type: General Level of consciousness: awake and alert Pain management: pain level controlled Vital Signs Assessment: post-procedure vital signs reviewed and stable Respiratory status: spontaneous breathing, nonlabored ventilation, respiratory function stable and patient connected to nasal cannula oxygen Cardiovascular status: blood pressure returned to baseline and stable Postop Assessment: no apparent nausea or vomiting Anesthetic complications: no   No notable events documented.   Last Vitals:  Vitals:   09/21/24 1734 09/21/24 1754  BP:  127/85  Pulse: 76 61  Resp: 16 17  Temp: (!) 36.3 C 36.4 C  SpO2: 96% (!) 89%    Last Pain:  Vitals:   09/21/24 1756  TempSrc:   PainSc: 5                  Debby Mines

## 2024-09-21 NOTE — Consult Note (Signed)
 PHARMACY - ANTICOAGULATION CONSULT NOTE  Pharmacy Consult for Heparin  Indication: limb ischemia  No Known Allergies  Patient Measurements: Height: 6' (182.9 cm) Weight: 89.9 kg (198 lb 3.1 oz) IBW/kg (Calculated) : 77.6 HEPARIN  DW (KG): 89.9  Vital Signs: Temp: 97.9 F (36.6 C) (12/02 0400) BP: 125/84 (12/02 0400) Pulse Rate: 55 (12/02 0400)  Labs: Recent Labs    09/18/24 0648 09/18/24 1328 09/19/24 0432 09/20/24 0440 09/20/24 1314 09/20/24 2103 09/21/24 0411  HGB 14.0  --  13.4 14.3  --   --   --   HCT 42.1  --  40.5 42.7  --   --   --   PLT 223  --  232 247  --   --   --   HEPARINUNFRC 0.51   < > 0.35 0.22* 0.33 0.32 0.42  CREATININE 0.99  --   --  0.74  --   --   --    < > = values in this interval not displayed.    Estimated Creatinine Clearance: 129.3 mL/min (by C-G formula based on SCr of 0.74 mg/dL).   Medical History: Past Medical History:  Diagnosis Date   Anxiety    Arthritis    Back pain    Peripheral vascular disease    Shingles     Medications:  No history of chronic anticoagulant use PTA  Assessment: 43 y.o. male whose medical history includes atherosclerosis of multiple arteries leading to multiple surgeries including autologous bypass graft years ago by Dr. Jama for the left leg.  He has known critical limb ischemia and is planned for a below-knee amputation of the left leg by Dr. Jama next week. Per patient, the swelling and redness of his left foot has worsened. Started on heparin  infusion. Vascular to see patient and assess left leg/foot.  Baseline labs: aPTT 32 sec, INR 1.0, Hgb 14.2, Plts 254  Goal of Therapy:  Heparin  level 0.3-0.7 units/ml Monitor platelets by anticoagulation protocol: Yes  11/29 0648 HL 0.51, therapeutic x1 11/29 1328 HL 0.34, therapeutic x 2 11/30 0432 HL 0.35, therapeutic x 3 12/01 0440 HL 0.22, subtherapeutic 12/01 1314 HL 0.33, therapeutic X 1 12/01 2103 HL 0.32, therapeutic X 2 at rate 1800  un/hr 12/02 0411 HL 0.42, therapeutic X 3   Plan: Continue heparin  infusion at 1800 units/hr Recheck HL on 12/03 with AM labs  Continue to monitor H&H and platelets daily while on heparin   Khrystian Schauf D Clinical Pharmacist 09/21/2024 5:40 AM

## 2024-09-21 NOTE — Interval H&P Note (Signed)
 History and Physical Interval Note:  09/21/2024 2:01 PM  Harold [name]  has presented today for surgery, with the diagnosis of ASO WITH REST PAIN.  The various methods of treatment have been discussed with the patient and family. After consideration of risks, benefits and other options for treatment, the patient has consented to  Procedure(s): AMPUTATION BELOW KNEE (Left) as a surgical intervention.  The patient's history has been reviewed, patient examined, no change in status, stable for surgery.  I have reviewed the patient's chart and labs.  Questions were answered to the patient's satisfaction.     Cordella Shawl

## 2024-09-21 NOTE — Anesthesia Procedure Notes (Signed)
 Procedure Name: Intubation Date/Time: 09/21/2024 2:24 PM  Performed by: Dyane Mass, CRNAPre-anesthesia Checklist: Patient identified, Emergency Drugs available, Suction available and Patient being monitored Patient Re-evaluated:Patient Re-evaluated prior to induction Oxygen Delivery Method: Circle system utilized Preoxygenation: Pre-oxygenation with 100% oxygen Induction Type: IV induction LMA: LMA inserted LMA Size: 5.0 Tube type: Oral Number of attempts: 2 Airway Equipment and Method: Stylet and Oral airway Placement Confirmation: positive ETCO2 and breath sounds checked- equal and bilateral Tube secured with: Tape Dental Injury: Teeth and Oropharynx as per pre-operative assessment  Comments: Attempted with LMA size 4 with leak heard and unable to seal. Removed second attempt with LMA 5 with success.

## 2024-09-21 NOTE — Anesthesia Preprocedure Evaluation (Signed)
 Anesthesia Evaluation  Patient identified by MRN, date of birth, ID band Patient awake    Reviewed: Allergy & Precautions, H&P , NPO status , Patient's Chart, lab work & pertinent test results, reviewed documented beta blocker date and time   Airway Mallampati: II  TM Distance: >3 FB Neck ROM: full    Dental  (+) Teeth Intact   Pulmonary neg pulmonary ROS, former smoker   Pulmonary exam normal        Cardiovascular Exercise Tolerance: Poor + Peripheral Vascular Disease  Normal cardiovascular exam Rate:Normal     Neuro/Psych  PSYCHIATRIC DISORDERS Anxiety Depression     Neuromuscular disease    GI/Hepatic negative GI ROS, Neg liver ROS,,,  Endo/Other  negative endocrine ROS    Renal/GU negative Renal ROS  negative genitourinary   Musculoskeletal   Abdominal   Peds  Hematology negative hematology ROS (+)   Anesthesia Other Findings   Reproductive/Obstetrics negative OB ROS                              Anesthesia Physical Anesthesia Plan  ASA: 3  Anesthesia Plan: General LMA   Post-op Pain Management:    Induction:   PONV Risk Score and Plan: 3  Airway Management Planned:   Additional Equipment:   Intra-op Plan:   Post-operative Plan:   Informed Consent: I have reviewed the patients History and Physical, chart, labs and discussed the procedure including the risks, benefits and alternatives for the proposed anesthesia with the patient or authorized representative who has indicated his/her understanding and acceptance.       Plan Discussed with: CRNA  Anesthesia Plan Comments:         Anesthesia Quick Evaluation

## 2024-09-22 ENCOUNTER — Encounter: Admission: RE | Disposition: A | Payer: Self-pay | Attending: Internal Medicine

## 2024-09-22 ENCOUNTER — Encounter: Payer: Self-pay | Admitting: Vascular Surgery

## 2024-09-22 ENCOUNTER — Telehealth (INDEPENDENT_AMBULATORY_CARE_PROVIDER_SITE_OTHER): Payer: Self-pay

## 2024-09-22 ENCOUNTER — Ambulatory Visit: Admission: RE | Admit: 2024-09-22 | Admitting: Vascular Surgery

## 2024-09-22 DIAGNOSIS — I70222 Atherosclerosis of native arteries of extremities with rest pain, left leg: Secondary | ICD-10-CM | POA: Diagnosis not present

## 2024-09-22 DIAGNOSIS — Z89512 Acquired absence of left leg below knee: Secondary | ICD-10-CM

## 2024-09-22 LAB — CBC
HCT: 39.7 % (ref 39.0–52.0)
Hemoglobin: 13.7 g/dL (ref 13.0–17.0)
MCH: 29.8 pg (ref 26.0–34.0)
MCHC: 34.5 g/dL (ref 30.0–36.0)
MCV: 86.5 fL (ref 80.0–100.0)
Platelets: 255 K/uL (ref 150–400)
RBC: 4.59 MIL/uL (ref 4.22–5.81)
RDW: 13.2 % (ref 11.5–15.5)
WBC: 15.8 K/uL — ABNORMAL HIGH (ref 4.0–10.5)
nRBC: 0 % (ref 0.0–0.2)

## 2024-09-22 LAB — BASIC METABOLIC PANEL WITH GFR
Anion gap: 10 (ref 5–15)
BUN: 13 mg/dL (ref 6–20)
CO2: 24 mmol/L (ref 22–32)
Calcium: 9.3 mg/dL (ref 8.9–10.3)
Chloride: 104 mmol/L (ref 98–111)
Creatinine, Ser: 0.81 mg/dL (ref 0.61–1.24)
GFR, Estimated: >60 mL/min (ref >60)
Glucose, Bld: 112 mg/dL — ABNORMAL HIGH (ref 70–99)
Potassium: 3.9 mmol/L (ref 3.5–5.1)
Sodium: 138 mmol/L (ref 135–145)

## 2024-09-22 MED ORDER — OXYCODONE HCL 5 MG PO TABS
5.0000 mg | ORAL_TABLET | ORAL | Status: DC | PRN
  Administered 2024-09-22: 10 mg via ORAL
  Filled 2024-09-22: qty 2

## 2024-09-22 MED ORDER — HYDROMORPHONE HCL 1 MG/ML IJ SOLN
2.0000 mg | INTRAMUSCULAR | Status: DC | PRN
  Administered 2024-09-22 (×2): 2 mg via INTRAVENOUS
  Filled 2024-09-22 (×2): qty 2

## 2024-09-22 MED ORDER — GABAPENTIN 300 MG PO CAPS
300.0000 mg | ORAL_CAPSULE | Freq: Two times a day (BID) | ORAL | Status: DC
  Administered 2024-09-22 – 2024-09-24 (×5): 300 mg via ORAL
  Filled 2024-09-22 (×5): qty 1

## 2024-09-22 MED ORDER — HYDROMORPHONE HCL 1 MG/ML IJ SOLN
1.0000 mg | INTRAMUSCULAR | Status: DC | PRN
  Administered 2024-09-22 – 2024-09-23 (×2): 2 mg via INTRAVENOUS
  Administered 2024-09-23: 1 mg via INTRAVENOUS
  Administered 2024-09-23 (×2): 2 mg via INTRAVENOUS
  Administered 2024-09-23: 1 mg via INTRAVENOUS
  Administered 2024-09-24: 2 mg via INTRAVENOUS
  Filled 2024-09-22: qty 1
  Filled 2024-09-22 (×2): qty 2
  Filled 2024-09-22: qty 1
  Filled 2024-09-22: qty 2
  Filled 2024-09-22: qty 1
  Filled 2024-09-22 (×2): qty 2

## 2024-09-22 MED ORDER — CEFAZOLIN SODIUM-DEXTROSE 2-4 GM/100ML-% IV SOLN
2.0000 g | Freq: Three times a day (TID) | INTRAVENOUS | Status: AC
  Administered 2024-09-22 – 2024-09-23 (×2): 2 g via INTRAVENOUS
  Filled 2024-09-22 (×2): qty 100

## 2024-09-22 MED ORDER — OXYCODONE HCL 5 MG PO TABS
10.0000 mg | ORAL_TABLET | ORAL | Status: DC | PRN
  Administered 2024-09-22: 10 mg via ORAL
  Administered 2024-09-22 – 2024-09-23 (×6): 15 mg via ORAL
  Administered 2024-09-24: 10 mg via ORAL
  Administered 2024-09-24: 15 mg via ORAL
  Filled 2024-09-22 (×2): qty 3
  Filled 2024-09-22: qty 2
  Filled 2024-09-22 (×8): qty 3

## 2024-09-22 MED ORDER — POLYETHYLENE GLYCOL 3350 17 G PO PACK
34.0000 g | PACK | Freq: Every day | ORAL | Status: DC
  Administered 2024-09-23 – 2024-09-25 (×3): 34 g via ORAL
  Filled 2024-09-22 (×3): qty 2

## 2024-09-22 MED ORDER — FENTANYL CITRATE (PF) 50 MCG/ML IJ SOSY
25.0000 ug | PREFILLED_SYRINGE | INTRAMUSCULAR | Status: DC | PRN
  Administered 2024-09-22: 25 ug via INTRAVENOUS
  Filled 2024-09-22: qty 1

## 2024-09-22 MED ORDER — BACLOFEN 10 MG PO TABS
20.0000 mg | ORAL_TABLET | Freq: Two times a day (BID) | ORAL | Status: DC | PRN
  Administered 2024-09-22 – 2024-09-24 (×3): 20 mg via ORAL
  Filled 2024-09-22 (×3): qty 2

## 2024-09-22 NOTE — Telephone Encounter (Signed)
 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 Is this something that will be discussed post-op appointment in office, and can she get a copy of surgical notes then or does she have to request a certain way? Please advise.

## 2024-09-22 NOTE — Progress Notes (Signed)
 Progress Note    09/22/2024 3:41 PM 1 Day Post-Op  Subjective:  Harold [name] is a 43 yo male now POD #1 from:  PROCEDURE: Left below-the-knee amputation   PRE-OPERATIVE DIAGNOSIS: Left foot gangrene   POST-OPERATIVE DIAGNOSIS: same as above   SURGEON: Cordella JUDITHANN Shawl, MD   ASSISTANT(S): none   ANESTHESIA: general   ESTIMATED BLOOD LOSS: 150 cc  Patient is resting comfortably this morning in bed with his left lower extremity elevated.  He endorses he had a bad night as he did not sleep well due to pain.  His pain medication has been adjusted by the hospitalist team but it has not been a considerable time to see if this will help long-term.  Dressing is clean dry and intact.  No concerns overnight.  Vitals all remained stable.   Vitals:   09/22/24 1036 09/22/24 1441  BP: (!) 137/97 (!) 166/96  Pulse: 86 62  Resp: 17 16  Temp: 98.1 F (36.7 C) 97.7 F (36.5 C)  SpO2: 96% 93%   Physical Exam: Cardiac:  RRR, normal S1 and S2, no murmurs appreciated Lungs: Normal labored breathing, clear on auscultation throughout, no rales rhonchi or wheezing. Incisions: Left lower extremity BKA.  Dressing clean dry and intact.  No drainage or infection to note. Extremities: All extremities are warm to touch with palpable pulses.  Left lower extremity now with new BKA with dressing clean dry and intact.  No complications to note. Abdomen: Positive bowel sounds throughout, soft, nontender and nondistended. Neurologic: Alert and oriented x 3, answers all questions and follows commands appropriately.  CBC    Component Value Date/Time   WBC 15.8 (H) 09/22/2024 0813   RBC 4.59 09/22/2024 0813   HGB 13.7 09/22/2024 0813   HCT 39.7 09/22/2024 0813   PLT 255 09/22/2024 0813   MCV 86.5 09/22/2024 0813   MCH 29.8 09/22/2024 0813   MCHC 34.5 09/22/2024 0813   RDW 13.2 09/22/2024 0813   LYMPHSABS 1.6 09/17/2024 2257   MONOABS 0.8 09/17/2024 2257   EOSABS 0.2 09/17/2024 2257   BASOSABS  0.1 09/17/2024 2257    BMET    Component Value Date/Time   NA 138 09/22/2024 0813   K 3.9 09/22/2024 0813   CL 104 09/22/2024 0813   CO2 24 09/22/2024 0813   GLUCOSE 112 (H) 09/22/2024 0813   BUN 13 09/22/2024 0813   CREATININE 0.81 09/22/2024 0813   CREATININE 1.03 09/18/2015 1601   CALCIUM  9.3 09/22/2024 0813   GFRNONAA >60 09/22/2024 0813   GFRAA >60 07/21/2017 0809    INR    Component Value Date/Time   INR 1.0 09/17/2024 2257     Intake/Output Summary (Last 24 hours) at 09/22/2024 1541 Last data filed at 09/22/2024 1537 Gross per 24 hour  Intake 1380 ml  Output 150 ml  Net 1230 ml     Assessment/Plan:  43 y.o. male is s/p SEE ABOVE 1 Day Post-Op   PLAN Vascular surgery plans on doing first dressing change on Friday, 09/24/2024. Patient does not wish to go to rehab at this time.  He wishes to go home on Friday after the dressing change if there are no complications to note.  He had some pain issues overnight and was unable to sleep.  Patient was started on Neurontin 300 mg twice daily to help with nerve pain. I had a long discussion with him about the next 2 or 3 days and his progression toward getting home.  He was made aware  that if he can continue to work with physical therapy occupational therapy to be able to transfer on and off of toilet, walk with a walker, shower safely, ambulate up and down stairs in good pain control we can discuss going home on Friday.  I believe he is young enough and strong enough at this point in time to be able to accomplish those goals here in the hospital rather than go to rehab for 2 weeks.  I did counsel him on falling and falling directly on the stump as this would require another surgery to fix with possible above-the-knee amputation.  He verbalizes understanding.  I discussed the discharge plan with Dr. Kandis today and he is in agreement with the plan if the patient meets the required needs via PT/OT and is in good pain control.  DVT  prophylaxis: None at this time.   Gwendlyn JONELLE Shank [facility] 09/22/2024 3:41 PM

## 2024-09-22 NOTE — Telephone Encounter (Signed)
 Well he's still hospitalized but it is usually about 6-8 weeks of healing for his amputation and then probably a few months for him to get his prosthesis and learn to walk again.  We can help with paperwork it just take about 7 business days.  We can discuss all of this at his post op.

## 2024-09-22 NOTE — Progress Notes (Signed)
 [facility]  - [place] at [facility]   PATIENT NAME: Harold [name]    MR#:  [id number]  DATE OF BIRTH:  [birth year]  SUBJECTIVE:   Significant pain left stump today. Last bm 2 days ago    VITALS:  Blood pressure (!) 140/97, pulse 71, temperature 98.3 F (36.8 C), resp. rate 15, height 6' (1.829 m), weight 89.9 kg, SpO2 97%.  PHYSICAL EXAMINATION:   GENERAL:  43 y.o.-year-old patient with no acute distress.  LUNGS: Normal breath sounds bilaterally, no wheezing CARDIOVASCULAR: S1, S2 normal. No murmur   ABDOMEN: Soft, nontender, nondistended. Bowel sounds present.  EXTREMITIES: left BKA is wrapped NEUROLOGIC: nonfocal  patient is alert and awake   LABORATORY PANEL:  CBC Recent Labs  Lab 09/22/24 0813  WBC 15.8*  HGB 13.7  HCT 39.7  PLT 255    Chemistries  Recent Labs  Lab 09/17/24 2257 09/18/24 0648 09/22/24 0813  NA 139   < > 138  K 4.2   < > 3.9  CL 102   < > 104  CO2 29   < > 24  GLUCOSE 93   < > 112*  BUN 20   < > 13  CREATININE 1.12   < > 0.81  CALCIUM  9.7   < > 9.3  AST 27  --   --   ALT 39  --   --   ALKPHOS 173*  --   --   BILITOT 0.4  --   --    < > = values in this interval not displayed.   Assessment and Plan  Harold [name] is a 43 y.o. Caucasian male male with medical history significant for anxiety, osteoarthritis, peripheral vascular disease status post left big toe amputation, who presented to the emergency room with acute onset of worsening left foot and leg pain with associated swelling and mild erythema.   He has been following with Dr. Jama and is apparently scheduled for left below-knee amputation on 12/2  Critical limb ischemia of left lower extremity (HCC) Thromboangiitis obliterans (Buerger's disease) Left foot cellulitis --Patient with a history of atherosclerosis of multiple arteries status post autologous bypass graft for his left lower extremity and now has critical limb ischemia with BKA planned for  09/21/24 who presents to the emergency room for evaluation of worsening rest pain.  Initially his pain was mostly with exertion/movement --Per vascular surgery his pattern of occlusive disease was consistent with Buerger's disease.   --Appreciate vascular surgery input, s/p left BKA on 12/2 --will query vascular about need for ongoing IV abx --Pain control not adequate, will increase dilaudid , query vascular about benefit of vasodilator given buerger's disease concern. Patient reports he stopped smoking in 2018    Procedures: left BKA 12/2 Family communication :none Consults : vascular surgery CODE STATUS: full DVT Prophylaxis : none pending vascular surg eval today Level of care: Telemetry Status is: Inpatient Remains inpatient appropriate because: Ischemic foot    [name] M.D    [facility]   CC: Primary care physician; Podraza, Cole Christopher, PA-C

## 2024-09-22 NOTE — Evaluation (Signed)
 Physical Therapy Evaluation Patient Details Name: Harold [name] MRN: [id number] DOB: 11-30-1980 Today's Date: 09/22/2024  History of Present Illness  Harold [name] is a 43 y.o. 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 Pt admitted for critical limb ischemia s/p L BKA on 09/21/24.  Clinical Impression  Patient noted to be in supine position at PT arrival in room, for an initial PT evaluation due to a decline in functional status, with baseline mobility reported as independent, and currently requiring minA to modI for transfers and gait in room. The patient is A&O x 4, presenting with great willingness to work with PT. The patient resides in a house and lives spouse and children with family/friend support. There are 4 STE inside the residence. The overall clinical impression is that the patient presents with mild to moderate mobility limitations secondary to acute medical complications. Recommended skilled PT will address safety, mobility, and discharge planning. PT recommendation to d/c patient to HHPT upon medical clearance.        If plan is discharge home, recommend the following: A little help with walking and/or transfers;A little help with bathing/dressing/bathroom;Help with stairs or ramp for entrance;Assist for transportation   Can travel by private vehicle        Equipment Recommendations Rolling walker (2 wheels)  Recommendations for Other Services       Functional Status Assessment Patient has had a recent decline in their functional status and/or demonstrates limited ability to make significant improvements in function in a reasonable and predictable amount of time     Precautions / Restrictions Precautions Precautions: Fall Recall of Precautions/Restrictions: Intact Precaution/Restrictions  Comments: BKA L      Mobility  Bed Mobility Overal bed mobility: Needs Assistance, Modified Independent Bed Mobility: Supine to Sit, Sit to Supine     Supine to sit: Supervision, Modified independent (Device/Increase time) Sit to supine: Supervision, Modified independent (Device/Increase time)   General bed mobility comments: physical assist to bed sheets and vc for L limb management    Transfers Overall transfer level: Needs assistance Equipment used: Rolling walker (2 wheels) Transfers: Sit to/from Stand, Bed to chair/wheelchair/BSC Sit to Stand: Contact guard assist, Min assist, From elevated surface          Lateral/Scoot Transfers: Modified independent (Device/Increase time), Supervision General transfer comment: vc for movement sequence and limb positioning with standing from bed pt. used RW vc provided for hand positioning; pt request to return to bed due to pain pt. able to transfer to bed ModI with light physical assist for chair positioning for safety    Ambulation/Gait Ambulation/Gait assistance: Contact guard assist, Min assist Gait Distance (Feet): 10 Feet Assistive device: Rolling walker (2 wheels)         General Gait Details: 3 Point mobility pattern with RW; vc provided for movement sequence, RW positioning, safety with RW and limb positioning  Stairs            Wheelchair Mobility     Tilt Bed    Modified Rankin (Stroke Patients Only)       Balance Overall balance assessment: Needs assistance, Modified Independent Sitting-balance support: Feet supported Sitting balance-Leahy Scale: Normal Sitting balance - Comments: vc to relax limb in sitting   Standing balance support: During functional activity, Reliant on assistive device for balance Standing balance-Leahy Scale: Fair Standing balance comment: education on lateral  stepping and pivoting                             Pertinent Vitals/Pain Pain Assessment Pain  Assessment: 0-10 Pain Score: 9  Pain Location: L stump Pain Descriptors / Indicators: Constant Pain Intervention(s): Limited activity within patient's tolerance, Monitored during session, Repositioned, Patient requesting pain meds-RN notified, Premedicated before session    Home Living Family/patient expects to be discharged to:: Private residence Living Arrangements: Spouse/significant other;Children (10 and 6 yo children) Available Help at Discharge: Family Type of Home: House Home Access: Stairs to enter Entrance Stairs-Rails: Lawyer of Steps: 3-4   Home Layout: One level Home Equipment: None Additional Comments: 4 large dogs    Prior Function Prior Level of Function : Independent/Modified Independent;Working/employed;Driving                     Extremity/Trunk Assessment   Upper Extremity Assessment Upper Extremity Assessment: Defer to OT evaluation    Lower Extremity Assessment Lower Extremity Assessment: Overall WFL for tasks assessed;Generalized weakness    Cervical / Trunk Assessment Cervical / Trunk Assessment: Normal  Communication   Communication Communication: No apparent difficulties    Cognition Arousal: Alert Behavior During Therapy: WFL for tasks assessed/performed   PT - Cognitive impairments: No apparent impairments                         Following commands: Intact       Cueing Cueing Techniques: Verbal cues     General Comments      Exercises Other Exercises Other Exercises: pt educated on stump mangement, resting positioning and pain mangement strategies   Assessment/Plan    PT Assessment Patient needs continued PT services  PT Problem List Pain;Decreased mobility;Decreased activity tolerance;Decreased range of motion;Decreased strength       PT Treatment Interventions Gait training;Stair training;Functional mobility training;Therapeutic activities;Therapeutic exercise;Patient/family  education;Neuromuscular re-education;Balance training    PT Goals (Current goals can be found in the Care Plan section)  Acute Rehab PT Goals PT Goal Formulation: Patient unable to participate in goal setting    Frequency Min 2X/week     Co-evaluation PT/OT/SLP Co-Evaluation/Treatment: Yes Reason for Co-Treatment: Complexity of the patient's impairments (multi-system involvement);Necessary to address cognition/behavior during functional activity;For patient/therapist safety PT goals addressed during session: Mobility/safety with mobility;Strengthening/ROM;Proper use of DME;Balance OT goals addressed during session: ADL's and self-care;Strengthening/ROM       AM-PAC PT 6 Clicks Mobility  Outcome Measure Help needed turning from your back to your side while in a flat bed without using bedrails?: None Help needed moving from lying on your back to sitting on the side of a flat bed without using bedrails?: None Help needed moving to and from a bed to a chair (including a wheelchair)?: None Help needed standing up from a chair using your arms (e.g., wheelchair or bedside chair)?: None Help needed to walk in hospital room?: A Little Help needed climbing 3-5 steps with a railing? : A Lot 6 Click Score: 21    End of Session Equipment Utilized During Treatment: Gait belt Activity Tolerance: Patient limited by pain Patient left: in bed;with bed alarm set;with nursing/sitter in room;with call bell/phone within reach Nurse Communication: Mobility status;Patient requests pain meds PT Visit Diagnosis: Other abnormalities of gait and mobility (R26.89);Pain Pain - Right/Left: Left Pain - part of body: Leg    Time: 1350-1417 PT Time Calculation (min) (  ACUTE ONLY): 27 min   Charges:   PT Evaluation $PT Eval Low Complexity: 1 Low   PT General Charges $$ ACUTE PT VISIT: 1 Visit         Sherlean Lesches DPT, PT    Shravya Wickwire A Amyria Komar 09/22/2024, 3:26 PM

## 2024-09-22 NOTE — Evaluation (Signed)
 Occupational Therapy Evaluation Patient Details Name: Harold [name] MRN: [id number] DOB: [birth year] Today's Date: 09/22/2024   History of Present Illness   Harold [name] is a 43 y.o. 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 Pt admitted for critical limb ischemia s/p L BKA on 09/21/24.   Clinical Impressions Harold [name] was seen for OT evaluation this date. Prior to hospital admission, pt was IND including working full time. Pt lives with spouse and children (7 and 59 yo). Pt pain limited this session, medicated during session. Pt currently requires CGA + RW sit<>stand from bed x2 trials, cues for hand placement. MIN A + RW for ADL t/f ~10 ft, assist for RW mgmt. SUPERVISION chair>bed t/f.  Pt educated on bed level and seated HEP (handout provided), residual limb positioning for edema mgmt and to prevent contractures, pain mgmt strategies, and DME recs. Pt would benefit from skilled OT to address noted impairments and functional limitations (see below for any additional details). Upon hospital discharge, recommend OT follow up.   If plan is discharge home, recommend the following:   Help with stairs or ramp for entrance     Functional Status Assessment   Patient has had a recent decline in their functional status and demonstrates the ability to make significant improvements in function in a reasonable and predictable amount of time.     Equipment Recommendations   BSC/3in1;Other (comment);Wheelchair (measurements OT);Wheelchair cushion (measurements OT) (RW)     Recommendations for Other Services         Precautions/Restrictions   Precautions Precautions: Fall Recall of Precautions/Restrictions: Intact Restrictions Weight Bearing Restrictions Per Provider Order: Yes LLE Weight Bearing Per  Provider Order: Non weight bearing     Mobility Bed Mobility Overal bed mobility: Modified Independent                  Transfers Overall transfer level: Needs assistance Equipment used: Rolling walker (2 wheels) Transfers: Sit to/from Stand, Bed to chair/wheelchair/BSC Sit to Stand: Contact guard assist   Squat pivot transfers: Supervision              Balance Overall balance assessment: Needs assistance, Modified Independent Sitting-balance support: Feet supported Sitting balance-Leahy Scale: Normal     Standing balance support: Bilateral upper extremity supported Standing balance-Leahy Scale: Fair                             ADL either performed or assessed with clinical judgement   ADL Overall ADL's : Needs assistance/impaired                                       General ADL Comments: MIN A + RW for ADL t/f ~10 ft. SUPERVISION simulated BSC t/f.       Pertinent Vitals/Pain Pain Assessment Pain Assessment: 0-10 Pain Score: 10-Worst pain ever Pain Location: L residual limb Pain Descriptors / Indicators: Crying, Grimacing, Guarding, Constant, Spasm, Throbbing Pain Intervention(s): Limited activity within patient's tolerance, Repositioned, Patient requesting pain meds-RN notified, RN gave pain meds during session     Extremity/Trunk Assessment Upper Extremity Assessment Upper Extremity Assessment: Overall WFL for tasks assessed   Lower Extremity Assessment Lower Extremity Assessment: LLE deficits/detail LLE Deficits / Details: residual  limb ace wrapped, unable to achieve full knee ext citing pain   Cervical / Trunk Assessment Cervical / Trunk Assessment: Normal   Communication Communication Communication: No apparent difficulties   Cognition Arousal: Alert Behavior During Therapy: WFL for tasks assessed/performed Cognition: No apparent impairments                               Following commands:  Intact       Cueing  General Comments          Exercises Other Exercises Other Exercises: pt educated on bed level and seated HEP, positioning for edema mgmt and to prevent contractures, pain mgmt strategies, and DME recs   Shoulder Instructions      Home Living Family/patient expects to be discharged to:: Private residence Living Arrangements: Spouse/significant other;Children (10 and 6 yo children) Available Help at Discharge: Family Type of Home: House Home Access: Stairs to enter Secretary/administrator of Steps: 3-4 Entrance Stairs-Rails: Left;Right Home Layout: One level               Home Equipment: None   Additional Comments: 4 large dogs      Prior Functioning/Environment Prior Level of Function : Independent/Modified Independent;Working/employed;Driving                    OT Problem List: Decreased strength;Decreased range of motion;Decreased activity tolerance;Impaired balance (sitting and/or standing);Pain   OT Treatment/Interventions: Self-care/ADL training;Therapeutic exercise;Energy conservation;DME and/or AE instruction;Therapeutic activities;Patient/family education;Balance training      OT Goals(Current goals can be found in the care plan section)   Acute Rehab OT Goals Patient Stated Goal: to improve pain and go home OT Goal Formulation: With patient Time For Goal Achievement: 10/06/24 Potential to Achieve Goals: Good ADL Goals Pt Will Perform Grooming: with supervision;standing Pt Will Perform Lower Body Dressing: with supervision;with modified independence;sit to/from stand Pt Will Transfer to Toilet: with modified independence;ambulating;regular height toilet   OT Frequency:  Min 3X/week    Co-evaluation PT/OT/SLP Co-Evaluation/Treatment: Yes Reason for Co-Treatment: For patient/therapist safety PT goals addressed during session: Mobility/safety with mobility OT goals addressed during session: ADL's and self-care       AM-PAC OT 6 Clicks Daily Activity     Outcome Measure Help from another person eating meals?: None Help from another person taking care of personal grooming?: A Little Help from another person toileting, which includes using toliet, bedpan, or urinal?: A Little Help from another person bathing (including washing, rinsing, drying)?: A Lot Help from another person to put on and taking off regular upper body clothing?: A Little Help from another person to put on and taking off regular lower body clothing?: A Lot 6 Click Score: 17   End of Session Equipment Utilized During Treatment: Gait belt;Rolling walker (2 wheels) Nurse Communication: Mobility status;Patient requests pain meds  Activity Tolerance: Patient tolerated treatment well;Patient limited by pain Patient left: in bed;with call bell/phone within reach;with nursing/sitter in room  OT Visit Diagnosis: Other abnormalities of gait and mobility (R26.89);Muscle weakness (generalized) (M62.81)                Time: 8661-8583 OT Time Calculation (min): 38 min Charges:  OT General Charges $OT Visit: 1 Visit OT Evaluation $OT Eval Moderate Complexity: 1 Mod OT Treatments $Self Care/Home Management : 8-22 mins $Therapeutic Exercise: 8-22 mins  Elston Slot, M.S. OTR/L  09/22/24, 3:31 PM  ascom [phone]

## 2024-09-22 NOTE — Telephone Encounter (Signed)
 Spoke to patients wife at this time and she stated the insurance company had faxed over the paperwork to the office and she had some paperwork she would bring to her husbands post-op appointment as well. His post-op appointment was moved from 12/9 @ 1400 to 12/9 @ 1015 for patient convenience. Advised patient to call office with any further concerns.

## 2024-09-23 DIAGNOSIS — I70222 Atherosclerosis of native arteries of extremities with rest pain, left leg: Secondary | ICD-10-CM | POA: Diagnosis not present

## 2024-09-23 DIAGNOSIS — R52 Pain, unspecified: Secondary | ICD-10-CM

## 2024-09-23 LAB — CBC
HCT: 40.5 % (ref 39.0–52.0)
Hemoglobin: 13.2 g/dL (ref 13.0–17.0)
MCH: 29.4 pg (ref 26.0–34.0)
MCHC: 32.6 g/dL (ref 30.0–36.0)
MCV: 90.2 fL (ref 80.0–100.0)
Platelets: 255 K/uL (ref 150–400)
RBC: 4.49 MIL/uL (ref 4.22–5.81)
RDW: 13.2 % (ref 11.5–15.5)
WBC: 13 K/uL — ABNORMAL HIGH (ref 4.0–10.5)
nRBC: 0 % (ref 0.0–0.2)

## 2024-09-23 LAB — SURGICAL PATHOLOGY

## 2024-09-23 MED ORDER — ENOXAPARIN SODIUM 40 MG/0.4ML IJ SOSY
40.0000 mg | PREFILLED_SYRINGE | INTRAMUSCULAR | Status: DC
  Administered 2024-09-23 – 2024-09-25 (×3): 40 mg via SUBCUTANEOUS
  Filled 2024-09-23 (×2): qty 0.4

## 2024-09-23 MED ORDER — MAGNESIUM SULFATE 2 GM/50ML IV SOLN
2.0000 g | Freq: Once | INTRAVENOUS | Status: AC
  Administered 2024-09-23: 2 g via INTRAVENOUS
  Filled 2024-09-23: qty 50

## 2024-09-23 MED ORDER — NIFEDIPINE ER OSMOTIC RELEASE 30 MG PO TB24
30.0000 mg | ORAL_TABLET | Freq: Every day | ORAL | Status: DC
  Administered 2024-09-23 – 2024-09-25 (×3): 30 mg via ORAL
  Filled 2024-09-23 (×3): qty 1

## 2024-09-23 NOTE — TOC Initial Note (Signed)
 Transition of Care [facility]) - Initial/Assessment Note    Patient Details  Name: Harold [name] MRN: [id number] Date of Birth: 1981/05/13  [facility]) CM/SW Contact:    Harold ONEIDA Haddock, RN Phone Number: 09/23/2024, 2:11 PM  Clinical Narrative:                    Admitted for: s/p BKA Admitted from:home with wife PCP: Patient states that he is not current with PCP  Current home health/prior home health/DME: NA  Therapy recommending HH and RW.  Patient in agreement and states he does not have preference of agency.   HH referral sent out in [place] RW referral made to Mitch with [employer]  Gwendlyn Shank with vascular confirms he will sign outpatient [facility] orders       Patient Goals and CMS Choice            Expected Discharge Plan and Services                                              Prior Living Arrangements/Services                       Activities of Daily Living   ADL Screening (condition at time of admission) Independently performs ADLs?: Yes (appropriate for developmental age) Is the patient deaf or have difficulty hearing?: No Does the patient have difficulty seeing, even when wearing glasses/contacts?: No Does the patient have difficulty concentrating, remembering, or making decisions?: No  Permission Sought/Granted                  Emotional Assessment              Admission diagnosis:  Ischemic leg [I99.8] Cellulitis of left foot [L03.116] Intractable pain [R52] Critical limb ischemia of left lower extremity (HCC) [I70.222] Patient Active Problem List   Diagnosis Date Noted   S/P BKA (below knee amputation) unilateral, left (HCC) 09/22/2024   Critical limb ischemia of left lower extremity (HCC) 09/18/2024   Cellulitis of left foot 09/18/2024   Atherosclerosis of native arteries of extremity with intermittent claudication 05/30/2021   Amputated great toe of left foot 01/12/2018   Chronic use of opiate for  therapeutic purpose 01/01/2018   Depression 01/01/2018   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) 01/01/2018   Chronic pain syndrome 11/25/2017   Atherosclerosis of artery of extremity with rest pain (HCC) 07/22/2017   Vitamin D deficiency 07/14/2017   Pure hypercholesterolemia 03/06/2017   Leg pain 03/06/2017   Tobacco dependence 08/22/2016   Atherosclerosis of native arteries of extremity with rest pain (HCC) 08/22/2016   PAD (peripheral artery disease) 08/22/2016   Ejaculatory disorder 07/10/2016   History of open reduction and internal fixation (ORIF) procedure 07/10/2016   Mild single current episode of major depressive disorder 07/10/2016   Testicular mass 07/10/2016   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 [place] [address] Phone: [phone] Fax: [phone]     Social Drivers of Health (SDOH) Social History: SDOH Screenings   Food Insecurity: No Food Insecurity (09/18/2024)  Housing: High  Risk (09/18/2024)  Transportation Needs: No Transportation Needs (09/18/2024)  Utilities: Not At Risk (09/18/2024)  Financial Resource Strain: Low Risk  (03/18/2023)   Received from [facility]  Social Connections: Unknown (03/04/2022)   Received from [facility]  Tobacco Use: Medium Risk (09/21/2024)   SDOH Interventions:     Readmission Risk Interventions     No data to display

## 2024-09-23 NOTE — Progress Notes (Signed)
 Physical Therapy Treatment Patient Details Name: Harold [name] MRN: [id number] DOB: [birth year] Today's Date: 09/23/2024   History of Present Illness Harold [name] is a 43 y.o. 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 Pt admitted for critical limb ischemia s/p L BKA on 09/21/24.    PT Comments  Pt was long sitting in bed upon arrival. Still endorsing significant pain however pt endorses feeling better today versus yesterday. Pt agrees to OOB activity. He demonstrates safe abilities to exit bed, stand to RW, and hop around room without difficulty or safety concerns. Distance limited by pain. Pt agreeable to attempting stairs next session (tomorrow) to simulate home entry exit. A lot of time spent educating pt on importance of stretching, phantom pain and how to assist with relieving it, desensitization techniques, and prosthetic prep. Pt states understanding. DC recs remain appropriate. Acute PT will continue current POC.     If plan is discharge home, recommend the following: A little help with walking and/or transfers;A little help with bathing/dressing/bathroom;Help with stairs or ramp for entrance;Assist for transportation     Equipment Recommendations  Rolling walker (2 wheels);Wheelchair (measurements PT);Wheelchair cushion (measurements PT);BSC/3in1 (W/C if pt's spouse has not already got one for him)       Precautions / Restrictions Precautions Precautions: Fall Recall of Precautions/Restrictions: Intact Precaution/Restrictions Comments: BKA L Restrictions Weight Bearing Restrictions Per Provider Order: Yes LLE Weight Bearing Per Provider Order: Non weight bearing     Mobility  Bed Mobility Overal bed mobility: Modified Independent Bed Mobility: Supine to Sit, Sit to Supine  Supine to sit:  Supervision   Transfers Overall transfer level: Needs assistance Equipment used: Rolling walker (2 wheels) Transfers: Sit to/from Stand Sit to Stand: Supervision  General transfer comment: only vcs for technique improvements    Ambulation/Gait Ambulation/Gait assistance: Supervision Gait Distance (Feet): 30 Feet Assistive device: Rolling walker (2 wheels) Gait Pattern/deviations: Step-to pattern Gait velocity: decreased  General Gait Details: Pt was able to tolerate ambulation to doorway of room and then to window. distance limited by pain, not endurance or strength. Will perform stairs next session to simulate home entry   Balance Overall balance assessment: Needs assistance, Modified Independent Sitting-balance support: Feet supported Sitting balance-Leahy Scale: Normal     Standing balance support: Bilateral upper extremity supported, During functional activity, Reliant on assistive device for balance Standing balance-Leahy Scale: Fair Standing balance comment: no LOB, reliant on RW     Communication Communication Communication: No apparent difficulties  Cognition Arousal: Alert Behavior During Therapy: WFL for tasks assessed/performed   PT - Cognitive impairments: No apparent impairments      PT - Cognition Comments: Pt is A and O x 4. premedicated ~ 20 minutes prior. pt is agreeable to session and remains cooperative and motivated. Still mentally coping with being a amputee Following commands: Intact      Cueing Cueing Techniques: Verbal cues, Tactile cues     General Comments General comments (skin integrity, edema, etc.): alot of time spent educating pt on phantom pain and ways to assist with decreasing it, importance of HS stretching, and management of pt's expectations at DC. pt agreeable to stairs next session      Pertinent Vitals/Pain Pain Assessment Pain Assessment: 0-10 Pain Score: 8  Pain Location: L residual limb Pain Descriptors / Indicators:  Crying, Grimacing, Guarding, Constant, Spasm, Throbbing Pain Intervention(s):  Limited activity within patient's tolerance, Monitored during session, Premedicated before session, Repositioned     PT Goals (current goals can now be found in the care plan section) Acute Rehab PT Goals Patient Stated Goal: return to normal life ASAP Progress towards PT goals: Progressing toward goals    Frequency    Min 2X/week           Co-evaluation     PT goals addressed during session: Mobility/safety with mobility;Balance;Proper use of DME;Strengthening/ROM        AM-PAC PT 6 Clicks Mobility   Outcome Measure  Help needed turning from your back to your side while in a flat bed without using bedrails?: None Help needed moving from lying on your back to sitting on the side of a flat bed without using bedrails?: None Help needed moving to and from a bed to a chair (including a wheelchair)?: None Help needed standing up from a chair using your arms (e.g., wheelchair or bedside chair)?: None Help needed to walk in hospital room?: A Little Help needed climbing 3-5 steps with a railing? : A Little 6 Click Score: 22    End of Session   Activity Tolerance: Patient limited by pain Patient left: in chair;with call bell/phone within reach;with chair alarm set Nurse Communication: Mobility status PT Visit Diagnosis: Other abnormalities of gait and mobility (R26.89);Pain Pain - Right/Left: Left Pain - part of body: Leg     Time: 1020-1045 PT Time Calculation (min) (ACUTE ONLY): 25 min  Charges:    $Gait Training: 8-22 mins $Therapeutic Exercise: 8-22 mins PT General Charges $$ ACUTE PT VISIT: 1 Visit                     Rankin Essex PTA 09/23/24, 1:47 PM

## 2024-09-23 NOTE — Progress Notes (Addendum)
 [facility]  - [place] [facility]   PATIENT NAME: Harold [name]    MR#:  [id number]  DATE OF BIRTH:  [birth year]  SUBJECTIVE:   Significant improvement in pain today, worked with therapy earlier   VITALS:  Blood pressure (!) 142/100, pulse (!) 104, temperature 97.7 F (36.5 C), temperature source Oral, resp. rate 17, height 6' (1.829 m), weight 89.9 kg, SpO2 97%.  PHYSICAL EXAMINATION:   GENERAL:  43 y.o.-year-old patient with no acute distress.  LUNGS: Normal breath sounds bilaterally, no wheezing CARDIOVASCULAR: S1, S2 normal. No murmur   ABDOMEN: Soft, nontender, nondistended. Bowel sounds present.  EXTREMITIES: left BKA is wrapped NEUROLOGIC: nonfocal  patient is alert and awake   LABORATORY PANEL:  CBC Recent Labs  Lab 09/23/24 0448  WBC 13.0*  HGB 13.2  HCT 40.5  PLT 255    Chemistries  Recent Labs  Lab 09/17/24 2257 09/18/24 0648 09/22/24 0813  NA 139   < > 138  K 4.2   < > 3.9  CL 102   < > 104  CO2 29   < > 24  GLUCOSE 93   < > 112*  BUN 20   < > 13  CREATININE 1.12   < > 0.81  CALCIUM  9.7   < > 9.3  AST 27  --   --   ALT 39  --   --   ALKPHOS 173*  --   --   BILITOT 0.4  --   --    < > = values in this interval not displayed.   Assessment and Plan  Harold [name] is a 43 y.o. Caucasian male male with medical history significant for anxiety, osteoarthritis, peripheral vascular disease status post left big toe amputation, who presented to the emergency room with acute onset of worsening left foot and leg pain with associated swelling and mild erythema.   He has been following with Dr. Jama and is apparently scheduled for left below-knee amputation on 12/2  Critical limb ischemia of left lower extremity (HCC) Thromboangiitis obliterans (Buerger's disease) Left foot cellulitis --Patient with a history of atherosclerosis of multiple arteries status post autologous bypass graft for his left lower extremity and now has  critical limb ischemia with BKA planned for 09/21/24 who presents to the emergency room for evaluation of worsening rest pain.  Initially his pain was mostly with exertion/movement --Per vascular surgery his pattern of occlusive disease was consistent with Buerger's disease.   --Appreciate vascular surgery input, s/p left BKA on 12/2 --Pain control improving. On dilaudid , gabapentin . Also trialing nifedipine  given possibility of buerger's disease - rolling walker ordered, pt/ot advising home health (ordering all today)    Procedures: left BKA 12/2 Family communication :none Consults : vascular surgery CODE STATUS: full DVT Prophylaxis : lovenox  Level of care: Telemetry Status is: Inpatient Remains inpatient appropriate because: Ischemic foot    Harold [name] M.D    [facility]   CC: Primary care physician; Podraza, Cole Christopher, PA-C

## 2024-09-23 NOTE — Progress Notes (Signed)
 Occupational Therapy Treatment Patient Details Name: Harold [name] MRN: [id number] DOB: 12/05/1980 Today's Date: 09/23/2024   History of present illness Harold [name] is a 43 y.o. 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 Pt admitted for critical limb ischemia s/p L BKA on 09/21/24.   OT comments  Mr Evetts was seen for OT treatment on this date. Upon arrival to room pt in bed, agreeable to tx. Pt requires MIN A + RW for standing grooming tasks, improves to CGA with single UE support. CGA LB dressing pulling pants up in standing. Pt educated on sleeping positioning, pain mgmt, DME recs, d/c recs. HEP reviewed - prone hip extension, side lying hip extension, chair push ups. Pt making good progress toward goals, will continue to follow POC. Discharge recommendation remains appropriate.      If plan is discharge home, recommend the following:  Help with stairs or ramp for entrance   Equipment Recommendations  BSC/3in1;Other (comment);Wheelchair (measurements OT);Wheelchair cushion (measurements OT)    Recommendations for Other Services      Precautions / Restrictions Precautions Precautions: Fall Recall of Precautions/Restrictions: Intact Precaution/Restrictions Comments: BKA L Restrictions Weight Bearing Restrictions Per Provider Order: Yes LLE Weight Bearing Per Provider Order: Non weight bearing       Mobility Bed Mobility Overal bed mobility: Modified Independent                  Transfers Overall transfer level: Needs assistance Equipment used: Rolling walker (2 wheels) Transfers: Sit to/from Stand Sit to Stand: Contact guard assist                 Balance Overall balance assessment: Needs assistance Sitting-balance support: Feet supported Sitting balance-Leahy Scale: Normal      Standing balance support: No upper extremity supported Standing balance-Leahy Scale: Poor                             ADL either performed or assessed with clinical judgement   ADL Overall ADL's : Needs assistance/impaired                                       General ADL Comments: MIN A + RW for standing grooming tasks, improves to CGA with single UE support. CGA LB dressing pulling pants up in standing.      Communication Communication Communication: No apparent difficulties   Cognition Arousal: Alert Behavior During Therapy: WFL for tasks assessed/performed Cognition: No apparent impairments                                        Cueing      Exercises Exercises: Other exercises Other Exercises Other Exercises: prone hip extension, side lying hip extension, chair push ups Other Exercises: educated on sleeping positioning, pain mgmt, DME recs, d/c recs            Pertinent Vitals/ Pain       Pain Assessment Pain Assessment: 0-10 Pain Score: 7  Pain Location: L residual limb Pain Descriptors / Indicators: Grimacing, Guarding, Constant, Spasm, Throbbing Pain Intervention(s): Patient requesting pain meds-RN notified, RN gave pain meds during session  Frequency  Min 3X/week        Progress Toward Goals  OT Goals(current goals can now be found in the care plan section)  Progress towards OT goals: Progressing toward goals  Acute Rehab OT Goals OT Goal Formulation: With patient Time For Goal Achievement: 10/06/24 Potential to Achieve Goals: Good ADL Goals Pt Will Perform Grooming: with supervision;standing Pt Will Perform Lower Body Dressing: with supervision;with modified independence;sit to/from stand Pt Will Transfer to Toilet: with modified independence;ambulating;regular height toilet  Plan         AM-PAC OT 6 Clicks Daily Activity     Outcome Measure   Help from another person eating meals?:  None Help from another person taking care of personal grooming?: A Little Help from another person toileting, which includes using toliet, bedpan, or urinal?: A Little Help from another person bathing (including washing, rinsing, drying)?: A Lot Help from another person to put on and taking off regular upper body clothing?: A Little Help from another person to put on and taking off regular lower body clothing?: A Lot 6 Click Score: 17    End of Session Equipment Utilized During Treatment: Gait belt;Rolling walker (2 wheels)  OT Visit Diagnosis: Other abnormalities of gait and mobility (R26.89);Muscle weakness (generalized) (M62.81)   Activity Tolerance Patient tolerated treatment well;Patient limited by pain   Patient Left in bed;with call bell/phone within reach   Nurse Communication  (IV dislodged)        Time: 1346-1430 OT Time Calculation (min): 44 min  Charges: OT General Charges $OT Visit: 1 Visit OT Treatments $Self Care/Home Management : 23-37 mins $Therapeutic Exercise: 8-22 mins  Elston Slot, M.S. OTR/L  09/23/24, 4:27 PM  ascom [phone]

## 2024-09-23 NOTE — Progress Notes (Signed)
 PT Cancellation Note  Patient Details Name: NIRAV SWEDA MRN: [id number] DOB: 01/25/1981   Cancelled Treatment:     PT attempt. Pt is A + O,  pleasant, but politely requested author to return shortly having next dose of pain medicine. Author will return as requested and continue to follow + progress per current POC.    Rankin KATHEE Essex 09/23/2024, 8:56 AM

## 2024-09-23 NOTE — Plan of Care (Signed)
   Problem: Education: Goal: Knowledge of General Education information will improve Description: Including pain rating scale, medication(s)/side effects and non-pharmacologic comfort measures Outcome: Progressing   Problem: Safety: Goal: Ability to remain free from injury will improve Outcome: Progressing   Problem: Skin Integrity: Goal: Risk for impaired skin integrity will decrease Outcome: Progressing

## 2024-09-23 NOTE — Progress Notes (Signed)
  Progress Note    09/23/2024 8:32 AM 2 Days Post-Op  Subjective:  Harold [name] is a 43 yo male now POD #2 from:   PROCEDURE: Left below-the-knee amputation   PRE-OPERATIVE DIAGNOSIS: Left foot gangrene   POST-OPERATIVE DIAGNOSIS: same as above   SURGEON: Cordella JUDITHANN Shawl, MD   ASSISTANT(S): none   ANESTHESIA: general   ESTIMATED BLOOD LOSS: 150 cc   Patient is  uncomfortable this morning in bed with his left lower extremity elevated.  He endorses he had a bad night as he did not sleep well due to pain and headaches.  His pain medication has been adjusted by the hospitalist team but patient is difficult to obtain good pain control. 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 Dressing is clean dry and intact.  No concerns overnight.  Vitals all remained stable.   Vitals:   09/23/24 0335 09/23/24 0727  BP: 136/89 (!) 142/100  Pulse: 60 (!) 104  Resp: 16 17  Temp: 97.8 F (36.6 C) 97.7 F (36.5 C)  SpO2: 94% 97%   Physical Exam: Cardiac:  RRR, normal S1 and S2, no murmurs appreciated Lungs: Normal labored breathing, clear on auscultation throughout, no rales rhonchi or wheezing. Incisions: Left lower extremity BKA.  Dressing clean dry and intact.  No drainage or infection to note. Extremities: All extremities are warm to touch with palpable pulses.  Left lower extremity now with new BKA with dressing clean dry and intact.  No complications to note. Abdomen: Positive bowel sounds throughout, soft, nontender and nondistended. Neurologic: Alert and oriented x 3, answers all questions and follows commands appropriately.  CBC    Component Value Date/Time   WBC 13.0 (H) 09/23/2024 0448   RBC 4.49 09/23/2024 0448   HGB 13.2 09/23/2024 0448   HCT 40.5 09/23/2024 0448   PLT 255 09/23/2024 0448   MCV 90.2 09/23/2024 0448   MCH 29.4 09/23/2024 0448   MCHC 32.6 09/23/2024 0448   RDW 13.2 09/23/2024 0448   LYMPHSABS 1.6 09/17/2024 2257   MONOABS 0.8 09/17/2024  2257   EOSABS 0.2 09/17/2024 2257   BASOSABS 0.1 09/17/2024 2257    BMET    Component Value Date/Time   NA 138 09/22/2024 0813   K 3.9 09/22/2024 0813   CL 104 09/22/2024 0813   CO2 24 09/22/2024 0813   GLUCOSE 112 (H) 09/22/2024 0813   BUN 13 09/22/2024 0813   CREATININE 0.81 09/22/2024 0813   CREATININE 1.03 09/18/2015 1601   CALCIUM  9.3 09/22/2024 0813   GFRNONAA >60 09/22/2024 0813   GFRAA >60 07/21/2017 0809    INR    Component Value Date/Time   INR 1.0 09/17/2024 2257     Intake/Output Summary (Last 24 hours) at 09/23/2024 0832 Last data filed at 09/23/2024 0335 Gross per 24 hour  Intake 738 ml  Output 1200 ml  Net -462 ml     Assessment/Plan:  43 y.o. male is s/p SEE ABOVE 2 Days Post-Op   PLAN Vascular surgery plans on doing first dressing change on Friday, 09/24/2024.  Patient does not wish to participate in rehab and would rather go home.  Plan is to send him home with he can obtain pain control and there are no complications related to his surgery after dressing change.  DVT prophylaxis:  None   Gwendlyn JONELLE Shank [facility] 09/23/2024 8:32 AM

## 2024-09-24 DIAGNOSIS — I70222 Atherosclerosis of native arteries of extremities with rest pain, left leg: Secondary | ICD-10-CM | POA: Diagnosis not present

## 2024-09-24 MED ORDER — OXYCODONE HCL 5 MG PO TABS
10.0000 mg | ORAL_TABLET | Freq: Four times a day (QID) | ORAL | Status: DC | PRN
  Administered 2024-09-25 (×2): 15 mg via ORAL
  Filled 2024-09-24 (×2): qty 3

## 2024-09-24 MED ORDER — HYDROMORPHONE HCL 2 MG PO TABS
4.0000 mg | ORAL_TABLET | ORAL | Status: DC | PRN
  Administered 2024-09-24: 4 mg via ORAL
  Filled 2024-09-24: qty 2

## 2024-09-24 MED ORDER — LORAZEPAM 1 MG PO TABS
1.0000 mg | ORAL_TABLET | Freq: Four times a day (QID) | ORAL | Status: DC | PRN
  Administered 2024-09-24 – 2024-09-25 (×2): 1 mg via ORAL
  Filled 2024-09-24 (×2): qty 1

## 2024-09-24 MED ORDER — LORAZEPAM 2 MG/ML IJ SOLN
1.0000 mg | Freq: Once | INTRAMUSCULAR | Status: AC
  Administered 2024-09-24: 1 mg via INTRAVENOUS
  Filled 2024-09-24: qty 1

## 2024-09-24 NOTE — Progress Notes (Cosign Needed)
 Patient is not able to walk the distance required to go the bathroom, or he/she is unable to safely negotiate stairs required to access the bathroom.  A 3in1 BSC will alleviate this problem

## 2024-09-24 NOTE — Progress Notes (Signed)
 Progress Note    09/24/2024 8:33 AM 3 Days Post-Op  Subjective:   Harold [name] is a 43 yo male now POD #2 from:   PROCEDURE: Left below-the-knee amputation   PRE-OPERATIVE DIAGNOSIS: Left foot gangrene   POST-OPERATIVE DIAGNOSIS: same as above   SURGEON: Cordella JUDITHANN Shawl, MD   ASSISTANT(S): none   ANESTHESIA: general   ESTIMATED BLOOD LOSS: 150 cc   Patient is  uncomfortable this morning in bed with his left lower extremity elevated.  He endorses he had a bad night as he did not sleep well due to pain and headaches.  His pain medication has been adjusted by the hospitalist team but patient is in better pain control today. 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 Dressing is clean dry and intact.  Patient noted to be very anxious today prior to physical therapy and dressing change. No concerns overnight.  Vitals all remained stable.     Vitals:   09/24/24 0420 09/24/24 0742  BP: 122/78 (!) 103/93  Pulse: (!) 105 100  Resp: 17 18  Temp: 99.4 F (37.4 C) 99.1 F (37.3 C)  SpO2: 98% 99%   Physical Exam: Cardiac:  RRR, normal S1 and S2, no murmurs appreciated Lungs: Normal labored breathing, clear on auscultation throughout, no rales rhonchi or wheezing. Incisions: Left lower extremity BKA.  Dressing clean dry and intact.  No drainage or infection to note. First dressing change completed today without complications.  Extremities: All extremities are warm to touch with palpable pulses.  Left lower extremity now with new BKA with dressing clean dry and intact and changed today.  No complications to note. Abdomen: Positive bowel sounds throughout, soft, nontender and nondistended. Neurologic: Alert and oriented x 3, answers all questions and follows commands appropriately.  CBC    Component Value Date/Time   WBC 13.0 (H) 09/23/2024 0448   RBC 4.49 09/23/2024 0448   HGB 13.2 09/23/2024 0448   HCT 40.5 09/23/2024 0448   PLT 255 09/23/2024 0448   MCV 90.2  09/23/2024 0448   MCH 29.4 09/23/2024 0448   MCHC 32.6 09/23/2024 0448   RDW 13.2 09/23/2024 0448   LYMPHSABS 1.6 09/17/2024 2257   MONOABS 0.8 09/17/2024 2257   EOSABS 0.2 09/17/2024 2257   BASOSABS 0.1 09/17/2024 2257    BMET    Component Value Date/Time   NA 138 09/22/2024 0813   K 3.9 09/22/2024 0813   CL 104 09/22/2024 0813   CO2 24 09/22/2024 0813   GLUCOSE 112 (H) 09/22/2024 0813   BUN 13 09/22/2024 0813   CREATININE 0.81 09/22/2024 0813   CREATININE 1.03 09/18/2015 1601   CALCIUM  9.3 09/22/2024 0813   GFRNONAA >60 09/22/2024 0813   GFRAA >60 07/21/2017 0809    INR    Component Value Date/Time   INR 1.0 09/17/2024 2257     Intake/Output Summary (Last 24 hours) at 09/24/2024 0833 Last data filed at 09/23/2024 2110 Gross per 24 hour  Intake 480 ml  Output 475 ml  Net 5 ml     Assessment/Plan:  43 y.o. male is s/p SEE ABOVE  3 Days Post-Op   PLAN Dressing to left lower BKA completed today with the help of Nate from Physical therapy. Greatly appreciated.  No complications to note.  Okay to discharge when medical team deems appropriate.  Patient will need daily dressing changes. Instructions placed in discharge instructions for patient.  Patient will follow up with Vein and Vascular Surgery as scheduled.  DVT prophylaxis:  Lovenox  40 mg SQ Q24hrs.   Gwendlyn JONELLE Shank [facility] 09/24/2024 8:33 AM

## 2024-09-24 NOTE — Progress Notes (Signed)
 Occupational Therapy Treatment Patient Details Name: Harold [name] MRN: [id number] DOB: 06/07/1981 Today's Date: 09/24/2024   History of present illness Harold [name] is a 43 y.o. 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 Pt admitted for critical limb ischemia s/p L BKA on 09/21/24.   OT comments  Harold [name] was seen for OT treatment on this date. Upon arrival to room pt in bed, agreeable to tx. Pt requires SBA don pants sitting with single UE support, decreasing to CGA no UE support. Reviewed limb wrapping with spouse - video provided. Educated on DME recs, d/c recs, falls safety, and positioning. Pt making good progress toward goals, will continue to follow POC. Discharge recommendation remains appropriate.       If plan is discharge home, recommend the following:  Help with stairs or ramp for entrance   Equipment Recommendations  BSC/3in1;Other (comment);Wheelchair (measurements OT);Wheelchair cushion (measurements OT)    Recommendations for Other Services      Precautions / Restrictions Precautions Precautions: Fall Precaution/Restrictions Comments: BKA L Restrictions Weight Bearing Restrictions Per Provider Order: Yes LLE Weight Bearing Per Provider Order: Non weight bearing       Mobility Bed Mobility Overal bed mobility: Modified Independent                  Transfers Overall transfer level: Needs assistance Equipment used: None Transfers: Sit to/from Stand Sit to Stand: Contact guard assist                 Balance Overall balance assessment: Needs assistance Sitting-balance support: Feet supported Sitting balance-Leahy Scale: Normal     Standing balance support: No upper extremity supported Standing balance-Leahy Scale: Fair                              ADL either performed or assessed with clinical judgement   ADL Overall ADL's : Needs assistance/impaired                                       General ADL Comments: SBA don pants sitting with single UE support, decreasing to CGA no UE support     Communication Communication Communication: No apparent difficulties   Cognition Arousal: Alert Behavior During Therapy: WFL for tasks assessed/performed, Anxious Cognition: No apparent impairments                               Following commands: Intact        Cueing                 Pertinent Vitals/ Pain       Pain Assessment Pain Assessment: 0-10 Pain Score: 5  Pain Location: L residual limb Pain Descriptors / Indicators: Grimacing, Guarding, Constant, Spasm, Throbbing Pain Intervention(s): Limited activity within patient's tolerance, Repositioned, Premedicated before session   Frequency  Min 3X/week        Progress Toward Goals  OT Goals(current goals can now be found in the care plan section)  Progress towards OT goals: Progressing toward goals  Acute Rehab OT Goals OT Goal Formulation: With patient Time For Goal Achievement: 10/06/24 Potential to Achieve Goals: Good ADL Goals Pt Will  Perform Grooming: with supervision;standing Pt Will Perform Lower Body Dressing: with supervision;with modified independence;sit to/from stand Pt Will Transfer to Toilet: with modified independence;ambulating;regular height toilet  Plan      Co-evaluation                 AM-PAC OT 6 Clicks Daily Activity     Outcome Measure   Help from another person eating meals?: None Help from another person taking care of personal grooming?: A Little Help from another person toileting, which includes using toliet, bedpan, or urinal?: A Little Help from another person bathing (including washing, rinsing, drying)?: A Lot Help from another person to put on and taking off regular upper body  clothing?: A Little Help from another person to put on and taking off regular lower body clothing?: A Lot 6 Click Score: 17    End of Session    OT Visit Diagnosis: Other abnormalities of gait and mobility (R26.89);Muscle weakness (generalized) (M62.81)   Activity Tolerance Patient tolerated treatment well;Patient limited by pain   Patient Left in bed;with call bell/phone within reach;with family/visitor present   Nurse Communication          Time: 8641-8580 OT Time Calculation (min): 21 min  Charges: OT General Charges $OT Visit: 1 Visit OT Treatments $Self Care/Home Management : 8-22 mins  Elston Slot, M.S. OTR/L  09/24/24, 3:03 PM  ascom [phone]

## 2024-09-24 NOTE — Progress Notes (Signed)
 Physical Therapy Treatment Patient Details Name: Harold [name] MRN: [id number] DOB: 1981/10/05 Today's Date: 09/24/2024   History of Present Illness Harold [name] is a 43 y.o. 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 Pt admitted for critical limb ischemia s/p L BKA on 09/21/24.    PT Comments  Pt was seen for PT session at conclusion of OT session. Both pt's spouse and sister were present throughout session. Pt was premedicated for both pain and anxiety. He appears much more calm and less pain limited than observed by author earlier in the day. He agrees to session and OOB activity. Session focused on family education and  proper stair performance to simulate home entry/exit. Reviewed limb wrapping (issued pt dressing changes supplies) and then importance of positioning, there ex, stretching, etc. Pt's spouse endorses having all equipment (w/c, RW, tub bench at home already but pt does need BSC(3in1)) Pt demonstrated safe performance of getting OOB, standing to RW, and ambulating a short distance prior to sitting in w/c and author pushing pt to rehab gym for stair performance. Pt attempted stairs several different ways and concluded with pt doing bumping up stairs with safe abilities to stand from ground surface. Did issued gait belt for family use and additional safety. DC recs still remain appropriate. Acute PT will continue to follow per current POC.    If plan is discharge home, recommend the following: A little help with walking and/or transfers;A little help with bathing/dressing/bathroom;Help with stairs or ramp for entrance;Assist for transportation     Equipment Recommendations  BSC/3in1 (pt's spouse endorses having all DME need expect BSC (3 in 1))       Precautions / Restrictions Precautions Precautions:  Fall Recall of Precautions/Restrictions: Intact Precaution/Restrictions Comments: BKA L Restrictions Weight Bearing Restrictions Per Provider Order: Yes LLE Weight Bearing Per Provider Order: Non weight bearing     Mobility  Bed Mobility Overal bed mobility: Modified Independent   Transfers Overall transfer level: Needs assistance Equipment used: Rolling walker (2 wheels) Transfers: Sit to/from Stand Sit to Stand: Supervision  General transfer comment: pt is able to stand form EOB, w/c, and from ground surface after performing stairs training (bumping) with CGA-SBA    Ambulation/Gait Ambulation/Gait assistance: Supervision Gait Distance (Feet): 25 Feet Assistive device: Rolling walker (2 wheels) Gait Pattern/deviations: Step-to pattern Gait velocity: decreased  General Gait Details: Pt was able to hop ~ 25 ft without LOB prior to sitting in w/c and being pushed to rehab gym.   Stairs Stairs: Yes Stairs assistance: Contact guard assist, +2 safety/equipment Stair Management: No rails, One rail Left, Step to pattern, Backwards, Sideways, Seated/boosting Number of Stairs: 4 General stair comments: Pt attempted hopping up stairs to simulate home entry however due to pain unable to perform more than 1 step. Author demonstrated backwards technique with use of RW and bumping up on gluts. Pt elected to bump up/down stairs and did perform well with only min assist to stand from ground surface.    Balance Overall balance assessment: Needs assistance Sitting-balance support: Feet supported Sitting balance-Leahy Scale: Normal     Standing balance support: No upper extremity supported Standing balance-Leahy Scale: Fair         Hotel Manager: No apparent difficulties  Cognition Arousal: Suspect due to medications, Lethargic Behavior During Therapy: WFL for tasks assessed/performed, Anxious   PT - Cognitive  impairments: No apparent impairments         PT - Cognition Comments: Pt was calm and cooperative versus what was observed earlier in the day. Following commands: Intact      Cueing Cueing Techniques: Verbal cues, Tactile cues         Pertinent Vitals/Pain Pain Assessment Pain Assessment: 0-10 Pain Score: 5  Pain Location: L residual limb Pain Descriptors / Indicators: Grimacing, Guarding, Constant, Spasm, Throbbing Pain Intervention(s): Limited activity within patient's tolerance, Monitored during session, Premedicated before session, Repositioned     PT Goals (current goals can now be found in the care plan section) Acute Rehab PT Goals Patient Stated Goal: return to normal life ASAP Progress towards PT goals: Progressing toward goals    Frequency    Min 2X/week           Co-evaluation     PT goals addressed during session: Mobility/safety with mobility;Balance;Proper use of DME;Strengthening/ROM        AM-PAC PT 6 Clicks Mobility   Outcome Measure  Help needed turning from your back to your side while in a flat bed without using bedrails?: None Help needed moving from lying on your back to sitting on the side of a flat bed without using bedrails?: None Help needed moving to and from a bed to a chair (including a wheelchair)?: None Help needed standing up from a chair using your arms (e.g., wheelchair or bedside chair)?: A Little Help needed to walk in hospital room?: A Little Help needed climbing 3-5 steps with a railing? : A Little 6 Click Score: 21    End of Session Equipment Utilized During Treatment: Gait belt Activity Tolerance: Patient limited by pain Patient left: in chair;with call bell/phone within reach;with chair alarm set Nurse Communication: Mobility status PT Visit Diagnosis: Other abnormalities of gait and mobility (R26.89);Pain Pain - Right/Left: Left Pain - part of body: Leg     Time: 8579-8541 PT Time Calculation (min) (ACUTE ONLY): 38 min  Charges:    $Gait  Training: 23-37 mins $Therapeutic Activity: 8-22 mins PT General Charges $$ ACUTE PT VISIT: 1 Visit                     Rankin Essex PTA 09/24/24, 5:23 PM

## 2024-09-24 NOTE — TOC Progression Note (Signed)
 Transition of Care [facility]) - Progression Note    Patient Details  Name: Harold [name] MRN: [id number] Date of Birth: 03-17-1981  Transition of [facility] CM/SW Contact  Corean ONEIDA Haddock, RN Phone Number: 09/24/2024, 3:59 PM  Clinical Narrative:      Referral made to  Mitch with [employer] for [facility]                   Expected Discharge Plan and Services                                               Social Drivers of Health (SDOH) Interventions SDOH Screenings   Food Insecurity: No Food Insecurity (09/18/2024)  Housing: High Risk (09/18/2024)  Transportation Needs: No Transportation Needs (09/18/2024)  Utilities: Not At Risk (09/18/2024)  Financial Resource Strain: Low Risk  (03/18/2023)   Received from [facility]  Social Connections: Unknown (03/04/2022)   Received from [facility]  Tobacco Use: Medium Risk (09/21/2024)    Readmission Risk Interventions     No data to display

## 2024-09-24 NOTE — TOC Progression Note (Signed)
 Transition of Care [facility]) - Progression Note    Patient Details  Name: SYLAS TWOMBLY MRN: [id number] Date of Birth: Aug 03, 1981  Transition of Care [facility]) CM/SW Contact  Corean ONEIDA Haddock, RN Phone Number: 09/24/2024, 12:12 PM  Clinical Narrative:     [facility] accepted in [facility], as patient did not have a preference of HH agency.  Channing with [facility] notified Bedside Rn to provide patient with some dressing supplies at discharge                     Expected Discharge Plan and Services                                               Social Drivers of Health (SDOH) Interventions SDOH Screenings   Food Insecurity: No Food Insecurity (09/18/2024)  Housing: High Risk (09/18/2024)  Transportation Needs: No Transportation Needs (09/18/2024)  Utilities: Not At Risk (09/18/2024)  Financial Resource Strain: Low Risk  (03/18/2023)   Received from [facility]  Social Connections: Unknown (03/04/2022)   Received from [facility]  Tobacco Use: Medium Risk (09/21/2024)    Readmission Risk Interventions     No data to display

## 2024-09-24 NOTE — Plan of Care (Signed)

## 2024-09-24 NOTE — Progress Notes (Signed)
 [facility]  - [place] at [facility]   PATIENT NAME: Harold [name]    MR#:  [id number]  DATE OF BIRTH:  1981/09/22  SUBJECTIVE:   Uncontrollable muscle jerking today. Pain not well controlled. Very anxious   VITALS:  Blood pressure (!) 137/117, pulse (!) 101, temperature 98.6 F (37 C), temperature source Oral, resp. rate 20, height 6' (1.829 m), weight 89.9 kg, SpO2 100%.  PHYSICAL EXAMINATION:   GENERAL:  43 y.o.-year-old patient with no acute distress.  LUNGS: Normal breath sounds bilaterally, no wheezing CARDIOVASCULAR: S1, S2 normal. No murmur   ABDOMEN: Soft, nontender, nondistended. Bowel sounds present.  EXTREMITIES: left BKA is wrapped NEUROLOGIC: myoclonic jerks   LABORATORY PANEL:  CBC Recent Labs  Lab 09/23/24 0448  WBC 13.0*  HGB 13.2  HCT 40.5  PLT 255    Chemistries  Recent Labs  Lab 09/17/24 2257 09/18/24 0648 09/22/24 0813  NA 139   < > 138  K 4.2   < > 3.9  CL 102   < > 104  CO2 29   < > 24  GLUCOSE 93   < > 112*  BUN 20   < > 13  CREATININE 1.12   < > 0.81  CALCIUM  9.7   < > 9.3  AST 27  --   --   ALT 39  --   --   ALKPHOS 173*  --   --   BILITOT 0.4  --   --    < > = values in this interval not displayed.   Assessment and Plan  Harold [name] is a 43 y.o. Caucasian male male with medical history significant for anxiety, osteoarthritis, peripheral vascular disease status post left big toe amputation, who presented to the emergency room with acute onset of worsening left foot and leg pain with associated swelling and mild erythema.   He has been following with Dr. Jama and is apparently scheduled for left below-knee amputation on 12/2  Critical limb ischemia of left lower extremity (HCC) Thromboangiitis obliterans (Buerger's disease) Left foot cellulitis --Patient with a history of atherosclerosis of multiple arteries status post autologous bypass graft for his left lower extremity and now has critical limb  ischemia with BKA planned for 09/21/24 who presents to the emergency room for evaluation of worsening rest pain.  Initially his pain was mostly with exertion/movement --Per vascular surgery his pattern of occlusive disease was consistent with Buerger's disease.   --Appreciate vascular surgery input, s/p left BKA on 12/2 - rolling walker ordered, pt/ot advising home health (ordered) - dressing change today, cleared for discharge with vascular f/u 3-4 weeks  Acute pain Opioid-induced myoclonus Delirium Escalating use of opioids, also added gabapentin , due to unremitting patient complaints of pain, though with dressing change today per vascular appears to be primarily panic/anxiety as the dressing change itself didn't appear to cause much in the way of additional pain. This afternoon uncontrollable jerking, suspect opioid-induced myoclonus - will stop hydromorphone , continue oxy - ativan  prn will help with anxiety and myoclonus - stop gabapentin , b - continue nifedipine , may help with beurger's vasospasm    Procedures: left BKA 12/2 Family communication : wife at bedside 12/5 Consults : vascular surgery CODE STATUS: full DVT Prophylaxis : lovenox  Level of care: Telemetry Status is: Inpatient Remains inpatient appropriate because: delirious today    [name] M.D    [facility]   CC: Primary care physician; Podraza, Cole Christopher, PA-C

## 2024-09-24 NOTE — Discharge Instructions (Addendum)
 Vascular Discharge wound Care instruction:  Dressing changes need to be done once daily  Remove old dressing  Placed Zeroform Guaze X2 over the staple line then,  Cover Zeroform guaze with 4X4 Guaze then  Cover with ABD Pads X 2 then  Wrap with Kerlex rolls X 2 then   Wrap stump Snugly with 6 Inch Ace Bandage   Patient will follow up with Vein and Vascular Surgery in 3-4 weeks for staple removal. Follow up as Scheduled.     [facility]  They will call you to schedule when they will come out to see you for nursing and Physical Therapy Home health care service [address]  [phone] Open ? Closes 5?PM

## 2024-09-25 ENCOUNTER — Encounter (INDEPENDENT_AMBULATORY_CARE_PROVIDER_SITE_OTHER): Payer: Self-pay | Admitting: Vascular Surgery

## 2024-09-25 DIAGNOSIS — I70222 Atherosclerosis of native arteries of extremities with rest pain, left leg: Secondary | ICD-10-CM | POA: Diagnosis not present

## 2024-09-25 LAB — BASIC METABOLIC PANEL WITH GFR
Anion gap: 11 (ref 5–15)
BUN: 20 mg/dL (ref 6–20)
CO2: 26 mmol/L (ref 22–32)
Calcium: 9.2 mg/dL (ref 8.9–10.3)
Chloride: 99 mmol/L (ref 98–111)
Creatinine, Ser: 0.63 mg/dL (ref 0.61–1.24)
GFR, Estimated: >60 mL/min (ref >60)
Glucose, Bld: 106 mg/dL — ABNORMAL HIGH (ref 70–99)
Potassium: 3.5 mmol/L (ref 3.5–5.1)
Sodium: 136 mmol/L (ref 135–145)

## 2024-09-25 LAB — CBC
HCT: 38.3 % — ABNORMAL LOW (ref 39.0–52.0)
Hemoglobin: 13.1 g/dL (ref 13.0–17.0)
MCH: 29.2 pg (ref 26.0–34.0)
MCHC: 34.2 g/dL (ref 30.0–36.0)
MCV: 85.5 fL (ref 80.0–100.0)
Platelets: 287 K/uL (ref 150–400)
RBC: 4.48 MIL/uL (ref 4.22–5.81)
RDW: 12.7 % (ref 11.5–15.5)
WBC: 13.2 K/uL — ABNORMAL HIGH (ref 4.0–10.5)
nRBC: 0 % (ref 0.0–0.2)

## 2024-09-25 MED ORDER — LORAZEPAM 1 MG PO TABS: 1.0000 mg | ORAL_TABLET | Freq: Four times a day (QID) | ORAL | 0 refills | Status: AC | PRN

## 2024-09-25 MED ORDER — OXYCODONE HCL 5 MG PO TABS: ORAL_TABLET | ORAL | 0 refills | Status: DC

## 2024-09-25 MED ORDER — NIFEDIPINE ER 30 MG PO TB24: 30.0000 mg | ORAL_TABLET | Freq: Every day | ORAL | 1 refills | Status: AC

## 2024-09-25 MED ORDER — POLYETHYLENE GLYCOL 3350 17 GM/SCOOP PO POWD: 17.0000 g | Freq: Every day | ORAL | 1 refills | Status: AC

## 2024-09-25 NOTE — TOC CM/SW Note (Signed)
 Patient is not able to walk the distance required to go the bathroom, or he/she is unable to safely negotiate stairs required to access the bathroom.  A 3in1 BSC will alleviate this problem

## 2024-09-25 NOTE — Plan of Care (Signed)

## 2024-09-25 NOTE — Discharge Summary (Signed)
 Harold [name] FMW:[id number] DOB: 06-Jun-1981 DOA: 09/17/2024  PCP: Podraza, Cole Christopher, PA-C  Admit date: 09/17/2024 Discharge date: 09/25/2024  Time spent: 35 minutes  Recommendations for Outpatient Follow-up:  Pcp f/u Vascular surgery f/u 3-4 weeks     Discharge Diagnoses:  Principal Problem:   Critical limb ischemia of left lower extremity (HCC) Active Problems:   Thromboangiitis obliterans (Buerger's disease)   Chronic pain syndrome   Cellulitis of left foot   S/P BKA (below knee amputation) unilateral, left (HCC)   Intractable pain   Discharge Condition: stable  Diet recommendation: regular  Filed Weights   09/17/24 2251 09/18/24 0228  Weight: 90.7 kg 89.9 kg    History of present illness:  From admission h and p Harold [name] is a 43 y.o. Caucasian male male with medical history significant for anxiety, osteoarthritis, peripheral vascular disease status post left big toe amputation, who presented to the emergency room with acute onset of worsening left foot and leg pain with associated swelling and mild erythema.  The patient is a non-smoker.  He noticed discoloration with purplish color of his left foot.  No fever or chills.  No chest pain or palpitations.  No cough or wheezing or dyspnea.  No nausea or vomiting or abdominal pain.  No bleeding diathesis.  He has been following with Dr. Jama and is apparently scheduled for left below-knee amputation on 12/2.    Hospital Course:   Critical limb ischemia of left lower extremity (HCC) Thromboangiitis obliterans (Buerger's disease) Left foot cellulitis --Patient with a history of atherosclerosis of multiple arteries status post autologous bypass graft for his left lower extremity and now has critical limb ischemia with BKA planned for 09/21/24 who presents to the emergency room for evaluation of worsening rest pain.    --Per vascular surgery his pattern of occlusive disease was consistent with Buerger's  disease.   --Appreciate vascular surgery input, s/p left BKA on 12/2 - rolling walker ordered, bedside commode ordered, pt/ot advising home health (ordered) - dressing changed 12/5 and healing appropriately, cleared for discharge with vascular f/u 3-4 weeks   Acute pain Opioid-induced myoclonus Delirium Escalating use of opioids, also added gabapentin , due to unremitting patient complaints of pain. Yesterday afternoon uncontrollable jerking, suspect opioid-induced myoclonus. Gabapentin  can also do this. Much improved with de-escalation of opioids and d/c of gabapentin  - stop hydromorphone , continue oxy - ativan  prn will help with anxiety and myoclonus, do not however advise continuing this long-term - continue nifedipine , may help with beurger's vasospasm  Procedures: Left BKA 12/2   Consultations: Vascular surgery  Discharge Exam: Vitals:   09/25/24 0442 09/25/24 0903  BP: 135/88 (!) 142/87  Pulse: 96 95  Resp: 18 20  Temp: (!) 97.5 F (36.4 C)   SpO2: 97% 97%    GENERAL:  43 y.o.-year-old patient with no acute distress.  LUNGS: Normal breath sounds bilaterally, no wheezing CARDIOVASCULAR: S1, S2 normal. No murmur   ABDOMEN: Soft, nontender, nondistended. Bowel sounds present.  EXTREMITIES: left BKA is wrapped NEUROLOGIC: myoclonic jerks  Discharge Instructions   Discharge Instructions     Diet - low sodium heart healthy   Complete by: As directed    Discharge wound care:   Complete by: As directed    Increase activity slowly   Complete by: As directed       Allergies as of 09/25/2024   No Known Allergies      Medication List     STOP taking these medications  clopidogrel  75 MG tablet Commonly known as: PLAVIX        TAKE these medications    Allergy Eye 0.025-0.3 % ophthalmic solution Generic drug: naphazoline-pheniramine Place 1-2 drops into both eyes 4 (four) times daily as needed for eye irritation.   aspirin  EC 81 MG tablet Take 81 mg by  mouth at bedtime. Swallow whole.   LORazepam  1 MG tablet Commonly known as: ATIVAN  Take 1 tablet (1 mg total) by mouth every 6 (six) hours as needed for anxiety or sleep (muscle spasm).   NIFEdipine  30 MG 24 hr tablet Commonly known as: ADALAT  CC Take 1 tablet (30 mg total) by mouth daily. Start taking on: September 26, 2024   oxyCODONE  5 MG immediate release tablet Commonly known as: Oxy IR/ROXICODONE  1-2 tabs every 6 hours as needed for pain What changed:  how much to take how to take this when to take this reasons to take this additional instructions   polyethylene glycol powder 17 GM/SCOOP powder Commonly known as: MiraLax  Take 17 g by mouth daily. Dissolve 1 capful (17g) in 4-8 ounces of liquid and take by mouth daily.               Durable Medical Equipment  (From admission, onward)           Start     Ordered   09/24/24 1454  For home use only DME Bedside commode  Once       Question:  Patient needs a bedside commode to treat with the following condition  Answer:  Weakness   09/24/24 1454   09/23/24 1146  For home use only DME Walker rolling  Once       Question Answer Comment  Walker: With 5 Inch Wheels   Patient needs a walker to treat with the following condition Amputated left leg (HCC)      09/23/24 1145              Discharge Care Instructions  (From admission, onward)           Start     Ordered   09/25/24 0000  Discharge wound care:        09/25/24 1418           No Known Allergies  Contact information for follow-up providers     Schnier, Cordella MATSU, MD Follow up in 4 week(s).   Specialties: Vascular Surgery, Cardiology, Radiology, Vascular Surgery Why: Post Op follow up with Staple removal Contact information: [address] [place] [address] [phone]         Darilyn Rosalva Bruckner, PA-C Follow up.   Specialty: Physician Assistant Contact information: [address]  [place] [address] [phone]              Contact information for after-discharge care     Home Medical Care     [facility]) .   Service: Home Health Services                      The results of significant diagnostics from this hospitalization (including imaging, microbiology, ancillary and laboratory) are listed below for reference.    Significant Diagnostic Studies: VAS US  ABI WITH/WO TBI Result Date: 09/13/2024 LOWER EXTREMITY ARTERIAL DUPLEX STUDY Patient Name:  Harold [name] [facility]  Date of Exam:   09/09/2024 Medical Rec #: [id number]        Accession #:  [id number] Date of Birth: 04-11-1981        Patient Gender: M Patient Age:   31 years Exam Location:  [facility] Procedure:      VAS US  LOWER EXTREMITY ARTERIAL DUPLEX Referring Phys: ORVIN DARING --------------------------------------------------------------------------------  Indications: Peripheral artery disease.  Current ABI: Not Obtained Comparison Study: 02/05/2023 Performing Technologist: Leafy Gibes RVS  Examination Guidelines: A complete evaluation includes B-mode imaging, spectral Doppler, color Doppler, and power Doppler as needed of all accessible portions of each vessel. Bilateral testing is considered an integral part of a complete examination. Limited examinations for reoccurring indications may be performed as noted.   +-----------+--------+-----+--------+---------+--------+ LEFT       PSV cm/sRatioStenosisWaveform Comments +-----------+--------+-----+--------+---------+--------+ CFA Distal 67                   triphasic         +-----------+--------+-----+--------+---------+--------+ DFA        41                   triphasic         +-----------+--------+-----+--------+---------+--------+ SFA Prox   53                   triphasic         +-----------+--------+-----+--------+---------+--------+ SFA Mid    42                    biphasic          +-----------+--------+-----+--------+---------+--------+ SFA Distal 0                    Absent            +-----------+--------+-----+--------+---------+--------+ POP Distal 0                    Absent            +-----------+--------+-----+--------+---------+--------+ ATA Distal 0                    Absent            +-----------+--------+-----+--------+---------+--------+ PTA Distal 0                    Absent            +-----------+--------+-----+--------+---------+--------+ PERO Distal0                    Absent            +-----------+--------+-----+--------+---------+--------+  Summary: Left: Imaging and Waveforms obtained throughout in the Left Lower Extremity. No flow seen in the Left SFA distal segment to the level of the Ankle.  See table(s) above for measurements and observations. Electronically signed by Cordella Shawl MD on 09/13/2024 at 10:06:50 AM.    Final    PERIPHERAL VASCULAR CATHETERIZATION Result Date: 09/10/2024 See surgical note for result.   Microbiology: No results found for this or any previous visit (from the past 240 hours).   Labs: Basic Metabolic Panel: Recent Labs  Lab 09/20/24 0440 09/22/24 0813 09/25/24 0819  NA  --  138 136  K  --  3.9 3.5  CL  --  104 99  CO2  --  24 26  GLUCOSE  --  112* 106*  BUN  --  13 20  CREATININE 0.74 0.81 0.63  CALCIUM   --  9.3 9.2   Liver Function Tests: No results for input(s): AST, ALT, ALKPHOS, BILITOT, PROT,  ALBUMIN in the last 168 hours. No results for input(s): LIPASE, AMYLASE in the last 168 hours. No results for input(s): AMMONIA in the last 168 hours. CBC: Recent Labs  Lab 09/20/24 0440 09/21/24 0411 09/22/24 0813 09/23/24 0448 09/25/24 0819  WBC 6.4 6.7 15.8* 13.0* 13.2*  HGB 14.3 14.0 13.7 13.2 13.1  HCT 42.7 41.1 39.7 40.5 38.3*  MCV 87.5 87.1 86.5 90.2 85.5  PLT 247 230 255 255 287   Cardiac Enzymes: No results for  input(s): CKTOTAL, CKMB, CKMBINDEX, TROPONINI in the last 168 hours. BNP: BNP (last 3 results) No results for input(s): BNP in the last 8760 hours.  ProBNP (last 3 results) No results for input(s): PROBNP in the last 8760 hours.  CBG: No results for input(s): GLUCAP in the last 168 hours.     Signed:  [name] MD.  [facility] 09/25/2024, 2:19 PM

## 2024-09-25 NOTE — Progress Notes (Signed)
      Daily Progress Note   Assessment/Planning:   POD #4 s/p L BKA  Pain better this AM Suggested he work on extending L knee Ok to discharge from Vascular    Subjective  - 4 Days Post-Op   Pain better   Objective   Vitals:   09/24/24 1507 09/24/24 1756 09/24/24 2006 09/25/24 0442  BP: (!) 151/131 125/78 (!) 142/89 135/88  Pulse: (!) 101 (!) 101 (!) 105 96  Resp: 20  18 18   Temp: 98.6 F (37 C)  (!) 97.4 F (36.3 C) (!) 97.5 F (36.4 C)  TempSrc: Oral     SpO2: 100%  (!) 87% 97%  Weight:      Height:         Intake/Output Summary (Last 24 hours) at 09/25/2024 0850 Last data filed at 09/24/2024 0900 Gross per 24 hour  Intake 360 ml  Output 250 ml  Net 110 ml    VASC L BKA bandaged: mild flexion contracture    Laboratory   CBC    Latest Ref Rng & Units 09/23/2024    4:48 AM 09/22/2024    8:13 AM 09/21/2024    4:11 AM  CBC  WBC 4.0 - 10.5 K/uL 13.0  15.8  6.7   Hemoglobin 13.0 - 17.0 g/dL 86.7  86.2  85.9   Hematocrit 39.0 - 52.0 % 40.5  39.7  41.1   Platelets 150 - 400 K/uL 255  255  230     BMET    Component Value Date/Time   NA 138 09/22/2024 0813   K 3.9 09/22/2024 0813   CL 104 09/22/2024 0813   CO2 24 09/22/2024 0813   GLUCOSE 112 (H) 09/22/2024 0813   BUN 13 09/22/2024 0813   CREATININE 0.81 09/22/2024 0813   CREATININE 1.03 09/18/2015 1601   CALCIUM  9.3 09/22/2024 0813   GFRNONAA >60 09/22/2024 0813   GFRAA >60 07/21/2017 0809     Redell Door, MD, FACS, FSVS Covering for [facility]: [phone]  09/25/2024, 8:50 AM

## 2024-09-27 ENCOUNTER — Encounter (INDEPENDENT_AMBULATORY_CARE_PROVIDER_SITE_OTHER): Payer: Self-pay | Admitting: Nurse Practitioner

## 2024-09-27 ENCOUNTER — Encounter (INDEPENDENT_AMBULATORY_CARE_PROVIDER_SITE_OTHER): Payer: Self-pay

## 2024-09-27 NOTE — Telephone Encounter (Signed)
I sent a letter to his my chart

## 2024-09-28 ENCOUNTER — Ambulatory Visit (INDEPENDENT_AMBULATORY_CARE_PROVIDER_SITE_OTHER): Admitting: Vascular Surgery

## 2024-09-29 ENCOUNTER — Telehealth (INDEPENDENT_AMBULATORY_CARE_PROVIDER_SITE_OTHER): Payer: Self-pay

## 2024-09-29 NOTE — Telephone Encounter (Signed)
 Wife called into nurse line to report that she has been having difficulty securing nursing for home health, she reports that they had a Physical Therapist sent out yesterday that would not touch him until he had been seen by a nurse. Wife reports his wound is red and they are currently out of wound care supplies. Per wife [facility] company is [facility].   Call to [facility] was advised by Darice they are awaiting Authorization from insurance to come out and they will be sending a nurse out to him once they have this. Per NP and chart notes call back to wife to advise above and offer an earlier appointment for follow up. Wife reports they are 50 min away and expressed he can barely walk and at this time did not wish to bring him in. Advised her that this was an option if she changed her mind while waiting approval in regard to  a nurse with home health care.

## 2024-09-29 NOTE — Telephone Encounter (Addendum)
 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 Patient is stating that his pain medication is not touching his pain. Per the nurse the wound does not look infected, just some bruising and redness which is normal for a amputation. I asked if she could upload a picture through Mychart for Orvin Daring NP to look at the wound. Patient had an appt on 09/28/24 but called and canceled the appt stating he needed a morning appt. Patient has been rescheduled to 10/09/24 with a 1:00 pm appt which was most likely what was available at the time. Pictures were received and per Orvin Daring NP the wound is as it should be, no redness, drainage or swelling other that what is expected from a surgical wound. Patient wife is also complaining that the patient is complaining of the Ativan  causing hallucination and agitation. 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

## 2024-09-30 ENCOUNTER — Emergency Department
Admission: EM | Admit: 2024-09-30 | Discharge: 2024-09-30 | Disposition: A | Discharge status: Home / Self Care | Source: Ambulatory Visit | Attending: Emergency Medicine | Admitting: Emergency Medicine

## 2024-09-30 ENCOUNTER — Other Ambulatory Visit: Payer: Self-pay

## 2024-09-30 ENCOUNTER — Telehealth (INDEPENDENT_AMBULATORY_CARE_PROVIDER_SITE_OTHER): Payer: Self-pay

## 2024-09-30 DIAGNOSIS — G8918 Other acute postprocedural pain: Secondary | ICD-10-CM | POA: Insufficient documentation

## 2024-09-30 DIAGNOSIS — M25562 Pain in left knee: Secondary | ICD-10-CM | POA: Insufficient documentation

## 2024-09-30 MED ORDER — OXYCODONE-ACETAMINOPHEN 5-325 MG PO TABS
1.0000 | ORAL_TABLET | ORAL | Status: DC | PRN
  Administered 2024-09-30: 1 via ORAL
  Filled 2024-09-30: qty 1

## 2024-09-30 MED ORDER — OXYCODONE HCL 5 MG PO TABS: 5.0000 mg | ORAL_TABLET | Freq: Three times a day (TID) | ORAL | 0 refills | Status: DC | PRN

## 2024-09-30 NOTE — Discharge Instructions (Signed)
 Patient to follow-up with vascular surgery for appointment next week.  Please take the oxycodone  as prescribed, please do not drive or operate heavy machinery on the oxycodone  since it can cause you to be sleepy.  Please also follow-up with vascular surgery or your primary care doctor for your pain medicine referral.

## 2024-09-30 NOTE — ED Triage Notes (Signed)
 Pt arrives via POV with c/o pain after left knee BKA last Tuesday. Pt states that they had been managing well at home with the prescribed pain meds but they are now out of the medications and they tried to get in touch with their provider but were unable to get any more pain medication and the pain is unbearable. Per pt's support person, pt is looking for help with managing the pain and looking for intermediate help with wound care management, pain control and dealing with some social aspects of where the pt is living that are not conducive to pt's current healing process. Pt is A&Ox4 during triage.

## 2024-09-30 NOTE — Telephone Encounter (Signed)
 Patients wife called at this time stating her husband(Harold [name]) needed pain management at this time. Can we send a referral for patient to pain management at this time?

## 2024-09-30 NOTE — ED Provider Notes (Signed)
 Harold [name] Provider Note    Event Date/Time   First MD Initiated Contact with Patient 09/30/24 1003     (approximate)   History   Post-op Problem   HPI  Harold [name] is a 43 y.o. 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  States he ran out of pain medications.  Had tried to reach out to vascular surgery for additional pain meds and referral to pain specialist.  Patient states that his pain has been persistent since the surgery.  Ran out of his oxycodone  yesterday and so is here for refill of the medication.  Tried to go to his vascular surgery clinic but they referred him to the ED instead.  He denies any new changes to his leg, no fever or purulent drainage.  Per independent history from brother, brother is a engineer, civil (consulting), states that he thinks that patient's wife was not tightening his bandage enough during her wound care changes and so patient was having a lot of pain during dressing changes with home health.  Brother is able to help with wound care and dressing changes at this time and states that he has no additional needs or additional help requirements for wound care.  On independent review, he was discharged on 6 December after he was admitted for a left BKA, occlusive disease was consistent with Buerger's.  He had a rolling walker ordered bedside commode ordered, he was discharged with home health.     Physical Exam   Triage Vital Signs: ED Triage Vitals  Encounter Vitals Group     BP 09/30/24 0927 118/88     Girls Systolic BP Percentile --      Girls Diastolic BP Percentile --      Boys Systolic BP Percentile --      Boys Diastolic BP Percentile --      Pulse Rate 09/30/24 0927 100     Resp 09/30/24 0927 18     Temp 09/30/24 0927 97.6 F (36.4 C)     Temp Source 09/30/24 0927 Oral     SpO2 09/30/24 0927 100 %     Weight 09/30/24 0929 200 lb  (90.7 kg)     Height 09/30/24 0929 6' (1.829 m)     Head Circumference --      Peak Flow --      Pain Score 09/30/24 0928 9     Pain Loc --      Pain Education --      Exclude from Growth Chart --     Most recent vital signs: Vitals:   09/30/24 0927  BP: 118/88  Pulse: 100  Resp: 18  Temp: 97.6 F (36.4 C)  SpO2: 100%     General: Awake, no distress.  CV:  Good peripheral perfusion.  Resp:  Normal effort.  Abd:  No distention.  Other:  Left BKA, surgical sites covered with Xeroform, Xeroform is covering staples, site appears intact, there is no purulent drainage, no erythema or induration.  Not significantly tender on exam.  No palpable crepitus   ED Results / Procedures / Treatments   Labs (all labs ordered are listed, but only abnormal results are displayed) Labs Reviewed - No data to display     PROCEDURES:  Critical Care performed: No  Procedures   MEDICATIONS ORDERED IN ED: Medications  oxyCODONE -acetaminophen  (PERCOCET/ROXICET) 5-325 MG per tablet 1 tablet (1  tablet Oral Given 09/30/24 0933)     IMPRESSION / MDM / ASSESSMENT AND PLAN / ED COURSE  I reviewed the triage vital signs and the nursing notes.                              Differential diagnosis includes, but is not limited to, considered but doubt infection, patient denies any acute or worsening pain, states that pain has been persistent after running out of oxycodone , the size intact, doubt acute surgical complications at this time.  It sounds like brother is able to help with wound care and they have no additional wound care needs at this time.  Will discuss with vascular surgery to see if they have any other recommendations, otherwise we will plan to have them follow-up with vascular surgery or primary care about pain referral, will plan to refill his oxycodone .  Patient's presentation is most consistent with acute presentation with potential threat to life or bodily function.  Discussed  with vascular surgery who read with the plan, states that they can follow-up with him next week.  Agree with refill of oxycodone .  Otherwise considered but no indication for inpatient admission at this time, he safe for outpatient management.  Will discharge with strict return precautions, shared decision making to patient and brother and they are agreeable with this plan.  Discharge.       FINAL CLINICAL IMPRESSION(S) / ED DIAGNOSES   Final diagnoses:  Post-operative pain     Rx / DC Orders   ED Discharge Orders          Ordered    oxyCODONE  (ROXICODONE ) 5 MG immediate release tablet  Every 8 hours PRN        09/30/24 1031             Note:  This document was prepared using Dragon voice recognition software and may include unintentional dictation errors.    Harold Lorelle Cummins, MD 09/30/24 [phone]

## 2024-09-30 NOTE — Telephone Encounter (Signed)
 Pt brother came into office stating that his brother was not doing well with the pain meds and that they had a really bad night last night. Pt brother stated that the nurse advised them to go to the ED but that they didn't feel like it was that urgent and did not want to go to the ED. Pt brother asked if they could come in just to discuss surgery. I informed pt brother that we could schedule pt for a consult with Jama Hacker, MD. to discuss the surgery that was done on 12.2.25. Pt brother was offered next available with Dr Jama on 12.15.25  at 10:30 AM. Pt brother states that they wanted an appt today and declined this appt and stated they would go to the ED.

## 2024-09-30 NOTE — Telephone Encounter (Signed)
 Nurse from [facility] home health called and stated patient was in distress and his blood pressure was elevated and stated she informed patient to go to the emergency room. Patients brother called and stated that [place] needed pain control and he(brother) was pulling into the parking lot to see the doctor. Messages from voicemail 09/29/24. Currently chart review shows patient is currently in the ED.

## 2024-10-01 ENCOUNTER — Telehealth (INDEPENDENT_AMBULATORY_CARE_PROVIDER_SITE_OTHER): Payer: Self-pay

## 2024-10-01 ENCOUNTER — Other Ambulatory Visit (INDEPENDENT_AMBULATORY_CARE_PROVIDER_SITE_OTHER): Payer: Self-pay | Admitting: Nurse Practitioner

## 2024-10-01 DIAGNOSIS — Z89512 Acquired absence of left leg below knee: Secondary | ICD-10-CM

## 2024-10-01 NOTE — Telephone Encounter (Signed)
 Harold [name]  [phone] PT called in requesting orders for Brace/ Stump protector with strap for left knee, he reports patient has been experiencing pain and keeping the knee bent and thinks this well offer him more support. Please advise

## 2024-10-04 NOTE — Telephone Encounter (Signed)
 Referral sent

## 2024-10-07 NOTE — Telephone Encounter (Signed)
 Spoke to Zortman patients wife yesterday 12/17 @ 1130A to let her know the pain management referral was sent, she was inquiring about a DSS document that she faxed to the office on 09/27/24 and I did confirm the office fax number with her at this time.   Called Charmaine patients wife back at this time and made her aware the DSS paperwork has been filed and faxed to number [phone], confirmed phone number was correct at this time.

## 2024-10-17 ENCOUNTER — Other Ambulatory Visit: Payer: Self-pay | Admitting: Obstetrics and Gynecology

## 2024-10-22 ENCOUNTER — Other Ambulatory Visit (INDEPENDENT_AMBULATORY_CARE_PROVIDER_SITE_OTHER): Payer: Self-pay | Admitting: Nurse Practitioner

## 2024-10-22 ENCOUNTER — Ambulatory Visit (INDEPENDENT_AMBULATORY_CARE_PROVIDER_SITE_OTHER): Admitting: Nurse Practitioner

## 2024-10-22 ENCOUNTER — Encounter (INDEPENDENT_AMBULATORY_CARE_PROVIDER_SITE_OTHER): Payer: Self-pay | Admitting: Nurse Practitioner

## 2024-10-22 VITALS — BP 135/102 | HR 85 | Resp 18

## 2024-10-22 DIAGNOSIS — Z89512 Acquired absence of left leg below knee: Secondary | ICD-10-CM

## 2024-10-22 DIAGNOSIS — T8149XA Infection following a procedure, other surgical site, initial encounter: Secondary | ICD-10-CM

## 2024-10-22 MED ORDER — OXYCODONE HCL 10 MG PO TABS: 10.0000 mg | ORAL_TABLET | Freq: Three times a day (TID) | ORAL | 0 refills | Status: DC | PRN

## 2024-10-22 MED ORDER — SULFAMETHOXAZOLE-TRIMETHOPRIM 800-160 MG PO TABS: 1.0000 | ORAL_TABLET | Freq: Two times a day (BID) | ORAL | 0 refills | Status: DC

## 2024-10-24 ENCOUNTER — Encounter (INDEPENDENT_AMBULATORY_CARE_PROVIDER_SITE_OTHER): Payer: Self-pay | Admitting: Nurse Practitioner

## 2024-10-24 NOTE — Progress Notes (Signed)
 "  Subjective:    Patient ID: Harold [name], male    DOB: 19-Oct-1981, 44 y.o.   MRN: [id number] Chief Complaint  Patient presents with   Routine Post Op    [facility] 4 week follow up    HPI  Discussed the use of AI scribe software for clinical note transcription with the patient, who gave verbal consent to proceed.  History of Present Illness Harold [name] is a 44 year old male with recent left below-knee amputation who presents for postoperative wound care and pain management.  He is in the early postoperative period following left below-knee amputation and is actively participating in physical therapy to improve knee extension and strength in preparation for future prosthetic fitting. He is able to fully extend his knee and continues to work on this with therapy.  He performs daily dressing changes using Xeroform strips, which have caused discomfort and skin peeling with each change. He describes each bandage change as a significant ordeal due to the sensation of tearing off new skin. He has noted substantial scabbing across the incision and has been folding the Xeroform strips to minimize trauma during removal, with the most recent dressing removal being less traumatic. He double wraps the site at night to prevent the bandage from coming off during sleep.  He has persistent drainage around the incision and is concerned about possible infection. He has been cleaning the area as instructed and seeks guidance on wound care. He denies systemic symptoms such as fever or chills.  He is currently taking oxycodone  10 mg twice daily for postoperative pain, prescribed by his primary care physician. He previously used oxycodone  5 mg but required higher doses for adequate relief. He is tapering his use and intends to further reduce as tolerated. He has also received short-term pain relief from the emergency department when unable to obtain a prescription. He describes significant pain and is aware that  pain medication is intended for short-term use and is motivated to return to normal function.  He has experienced anxiety related to pain and difficulty accessing pain medication, leading to an emergency department visit. His wife is actively involved in his care and frequently has questions regarding his treatment.    Results Wound swab for culture Swab collected from area of drainage along incision of left below-knee amputation stump for microbiological culture. Surrounding tissue with drainage, erythema, and scabbing. No debridement or staple removal performed.   Review of Systems  Musculoskeletal:  Positive for gait problem.  Skin:  Positive for wound.  All other systems reviewed and are negative.      Objective:   Physical Exam Vitals reviewed.  HENT:     Head: Normocephalic.  Cardiovascular:     Rate and Rhythm: Normal rate.     Pulses: Normal pulses.  Pulmonary:     Effort: Pulmonary effort is normal.  Musculoskeletal:     Left Lower Extremity: Left leg is amputated below knee.  Skin:    General: Skin is warm and dry.  Neurological:     Mental Status: He is alert and oriented to person, place, and time.  Psychiatric:        Mood and Affect: Mood normal.        Behavior: Behavior normal.        Thought Content: Thought content normal.        Judgment: Judgment normal.     Physical Exam MUSCULOSKELETAL: Scabbing across knee incision. Drainage around knee incision. Knee incision wet,  inflamed, and angry.  BP (!) 135/102 (BP Location: Right Arm)   Pulse 85   Resp 18   Past Medical History:  Diagnosis Date   Anxiety    Arthritis    Back pain    Peripheral vascular disease    Shingles     Social History   Socioeconomic History   Marital status: Married    Spouse name: Not on file   Number of children: Not on file   Years of education: Not on file   Highest education level: Not on file  Occupational History   Not on file  Tobacco Use   Smoking  status: Former    Current packs/day: 0.00    Average packs/day: 0.5 packs/day for 16.0 years (8.0 ttl pk-yrs)    Types: Cigarettes    Quit date: 2018    Years since quitting: 8.0   Smokeless tobacco: Never  Vaping Use   Vaping status: Never Used  Substance and Sexual Activity   Alcohol use: No    Alcohol/week: 0.0 standard drinks of alcohol    Comment: rare   Drug use: No   Sexual activity: Not on file  Other Topics Concern   Not on file  Social History Narrative   Not on file   Social Drivers of Health   Tobacco Use: Medium Risk (10/24/2024)   Patient History    Smoking Tobacco Use: Former    Smokeless Tobacco Use: Never    Passive Exposure: Not on file  Financial Resource Strain: Low Risk (03/18/2023)   Received from [facility]   Overall Financial Resource Strain (CARDIA)    Difficulty of Paying Living Expenses: Not hard at all  Food Insecurity: Unknown (10/05/2024)   Received from [facility]   Epic    Within the past 12 months, you worried that your food would run out before you got money to buy more: Patient declined to answer    Within the past 12 months, the food you bought just didn't last and you didn't have money to get more. : Patient declined to answer  Transportation Needs: No Transportation Needs (10/05/2024)   Received from [facility]    In the past 12 months, has lack of reliable transportation kept you from medical appointments, meetings, work or from getting things needed for daily living? : No  Physical Activity: Not on file  Stress: Not on file  Social Connections: Unknown (03/04/2022)   Received from [facility]   Social Network    Social Network: Not on file  Intimate Partner Violence: Not At Risk (09/18/2024)   Epic    Fear of Current or Ex-Partner: No    Emotionally Abused: No    Physically Abused: No    Sexually Abused: No  Depression (PHQ2-9): Not on file  Alcohol Screen: Not on file  Housing: High Risk  (10/05/2024)   Received from [facility]   Epic    What is your living situation today?: I do not have a steady place to live (temporarily staying with others, in a hotel, in a shelter, ...    Think about the place you live. Do you have problems with any of the following? Choose all that apply:: None/None on this list  Utilities: Low Risk (10/05/2024)   Received from [facility]   Utilities    In the past 12 months has the electric, gas, oil, or water company threatened to shut off services in your home? : No  Health  Literacy: Not on file    Past Surgical History:  Procedure Laterality Date   AMPUTATION Left 09/21/2024   Procedure: AMPUTATION BELOW KNEE;  Surgeon: Jama Cordella MATSU, MD;  Location: [facility] ORS;  Service: Vascular;  Laterality: Left;   BYPASS GRAFT POPLITEAL TO POPLITEAL Left 09/20/2015   Procedure: BYPASS GRAFT POPLITEAL TO PERONEAL;  Surgeon: Cordella MATSU Jama, MD;  Location: [facility];  Service: Vascular;  Laterality: Left;   EMBOLECTOMY Left 09/20/2015   Procedure: EMBOLECTOMY;  Surgeon: Cordella MATSU Jama, MD;  Location: [facility];  Service: Vascular;  Laterality: Left;   LOWER EXTREMITY ANGIOGRAPHY Left 07/22/2017   Procedure: Lower Extremity Angiography;  Surgeon: Jama Cordella MATSU, MD;  Location: [facility];  Service: Cardiovascular;  Laterality: Left;   LOWER EXTREMITY ANGIOGRAPHY Left 09/10/2024   Procedure: Lower Extremity Angiography;  Surgeon: Marea Selinda RAMAN, MD;  Location: [facility];  Service: Cardiovascular;  Laterality: Left;   LOWER EXTREMITY INTERVENTION  07/22/2017   Procedure: LOWER EXTREMITY INTERVENTION;  Surgeon: Jama Cordella MATSU, MD;  Location: [facility];  Service: Cardiovascular;;   LUMBAR LAMINECTOMY/DECOMPRESSION MICRODISCECTOMY Right 12/29/2012   Procedure: SPINE: LAMINOTOMY DECOMPRESSION NERVE ROOT EXCISION HERNIATED DISK 1 SPACE LUMBAR;  Surgeon: Lacinda Ozell Mans, MD;  Location: [facility];  Service: Orthopedics;   Laterality: Right;  RIGHT L5-S1 DISC EXCISION   ORIF ANKLE FRACTURE Left 09/20/2015   Procedure: OPEN REDUCTION INTERNAL FIXATION (ORIF) ANKLE FRACTURE;  Surgeon: Ozell Flake, MD;  Location: [facility] OR[facility];  Service: Orthopedics;  Laterality: Left;   PERIPHERAL VASCULAR CATHETERIZATION Left 03/21/2015   Procedure: Lower Extremity Angiography;  Surgeon: Cordella MATSU Jama, MD;  Location: [facility];  Service: Cardiovascular;  Laterality: Left;   PERIPHERAL VASCULAR CATHETERIZATION  03/21/2015   Procedure: Lower Extremity Intervention;  Surgeon: Cordella MATSU Jama, MD;  Location: [facility];  Service: Cardiovascular;;   PERIPHERAL VASCULAR CATHETERIZATION Left 09/05/2015   Procedure: Lower Extremity Angiography;  Surgeon: Cordella MATSU Jama, MD;  Location: [facility];  Service: Cardiovascular;  Laterality: Left;   PERIPHERAL VASCULAR CATHETERIZATION  09/05/2015   Procedure: Lower Extremity Intervention;  Surgeon: Cordella MATSU Jama, MD;  Location: [facility];  Service: Cardiovascular;;   PERIPHERAL VASCULAR CATHETERIZATION Left 08/27/2016   Procedure: Lower Extremity Angiography;  Surgeon: Cordella MATSU Jama, MD;  Location: [facility];  Service: Cardiovascular;  Laterality: Left;   TOOTH EXTRACTION     WISDOM TOOTH EXTRACTION      Family History  Problem Relation Age of Onset   Cancer Mother    Diabetes Mother    Hypertension Mother    Hyperlipidemia Mother    Heart attack Father    Cancer Father     Allergies[1]     Latest Ref Rng & Units 09/25/2024    8:19 AM 09/23/2024    4:48 AM 09/22/2024    8:13 AM  CBC  WBC 4.0 - 10.5 K/uL 13.2  13.0  15.8   Hemoglobin 13.0 - 17.0 g/dL 86.8  86.7  86.2   Hematocrit 39.0 - 52.0 % 38.3  40.5  39.7   Platelets 150 - 400 K/uL 287  255  255       CMP     Component Value Date/Time   NA 136 09/25/2024 0819   K 3.5 09/25/2024 0819   CL 99 09/25/2024 0819   CO2 26 09/25/2024 0819   GLUCOSE 106 (H)  09/25/2024 0819   BUN 20 09/25/2024 0819  CREATININE 0.63 09/25/2024 0819   CREATININE 1.03 09/18/2015 1601   CALCIUM  9.2 09/25/2024 0819   PROT 7.5 09/17/2024 2257   ALBUMIN 4.2 09/17/2024 2257   AST 27 09/17/2024 2257   ALT 39 09/17/2024 2257   ALKPHOS 173 (H) 09/17/2024 2257   BILITOT 0.4 09/17/2024 2257   GFRNONAA >60 09/25/2024 0819     VAS US  ABI WITH/WO TBI Result Date: 09/13/2024 LOWER EXTREMITY ARTERIAL DUPLEX STUDY Patient Name:  GOTTLIEB ZUERCHER [facility]  Date of Exam:   09/09/2024 Medical Rec #: [id number]        Accession #:    [id number] Date of Birth: [birth year]        Patient Gender: M Patient Age:   89 years Exam Location:  [facility] Procedure:      VAS US  LOWER EXTREMITY ARTERIAL DUPLEX Referring Phys: ORVIN DARING --------------------------------------------------------------------------------  Indications: Peripheral artery disease.  Current ABI: Not Obtained Comparison Study: 02/05/2023 Performing Technologist: Leafy Gibes RVS  Examination Guidelines: A complete evaluation includes B-mode imaging, spectral Doppler, color Doppler, and power Doppler as needed of all accessible portions of each vessel. Bilateral testing is considered an integral part of a complete examination. Limited examinations for reoccurring indications may be performed as noted.   +-----------+--------+-----+--------+---------+--------+ LEFT       PSV cm/sRatioStenosisWaveform Comments +-----------+--------+-----+--------+---------+--------+ CFA Distal 67                   triphasic         +-----------+--------+-----+--------+---------+--------+ DFA        41                   triphasic         +-----------+--------+-----+--------+---------+--------+ SFA Prox   53                   triphasic         +-----------+--------+-----+--------+---------+--------+ SFA Mid    42                   biphasic          +-----------+--------+-----+--------+---------+--------+ SFA  Distal 0                    Absent            +-----------+--------+-----+--------+---------+--------+ POP Distal 0                    Absent            +-----------+--------+-----+--------+---------+--------+ ATA Distal 0                    Absent            +-----------+--------+-----+--------+---------+--------+ PTA Distal 0                    Absent            +-----------+--------+-----+--------+---------+--------+ PERO Distal0                    Absent            +-----------+--------+-----+--------+---------+--------+  Summary: Left: Imaging and Waveforms obtained throughout in the Left Lower Extremity. No flow seen in the Left SFA distal segment to the level of the Ankle.  See table(s) above for measurements and observations. Electronically signed by Cordella Shawl MD on 09/13/2024 at 10:06:50 AM.    Final        Assessment & Plan:  1. 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 Not ready for prosthetic fitting due to wound healing. Full healing expected in six weeks. - Encouraged continued physical therapy for knee extension and strength. - Advised against shrinker use until incision heals. - Outlined prosthetic fitting timeline: staple removal in 2-3 weeks, full healing in six weeks, then referral for fitting. - Discussed rehabilitation process including post-fitting physical therapy. 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  Acute postprocedural pain Significant acute pain post-amputation, requiring oxycodone  escalation. Pain expected to be short-term and related to surgery. Coordinated pain management to avoid overlap with other prescriptions. - Prescribed oxycodone  10 mg to be picked up at [facility] in [place], coordinated with PCP prescriptions. - Reviewed pain management protocol: medication  provided only by this office, contingent on attending appointments. - Instructed to taper pain medication as healing progresses, with goal to discontinue upon healing completion. - Guided on contacting office via MyChart for refills, emphasizing short-term use.    2. Surgical wound infection Postoperative wound infection after left below-knee amputation Signs of infection at the amputation site with drainage and inflammation. Risk of wound dehiscence and delayed healing if not managed. Full healing expected in six weeks with proper care. - Obtained wound swab for culture. - Prescribed antibiotics to be picked up at [facility] in [place]. - Instructed to continue Xeroform dressing to minimize trauma. - Advised cleaning incision with Betadine. - Directed to wrap incision with gauze and ace bandage, double wrap at night if needed. - Scheduled follow-up in one week for wound reassessment and possible staple removal. - Provided guidance on fall prevention and wound dehiscence risks. - Discussed six-week healing timeline before prosthetic fitting.   Medications Ordered Prior to Encounter[2]  There are no Patient Instructions on file for this visit. No follow-ups on file.   Octavie Westerhold E Avalynne Diver, NP      [1] No Known Allergies [2]  Current Outpatient Medications on File Prior to Visit  Medication Sig Dispense Refill   aspirin  EC 81 MG tablet Take 81 mg by mouth at bedtime. Swallow whole.     LORazepam  (ATIVAN ) 1 MG tablet Take 1 tablet (1 mg total) by mouth every 6 (six) hours as needed for anxiety or sleep (muscle spasm). 30 tablet 0   naphazoline-pheniramine (ALLERGY EYE) 0.025-0.3 % ophthalmic solution Place 1-2 drops into both eyes 4 (four) times daily as needed for eye irritation.     NIFEdipine  (ADALAT  CC) 30 MG 24 hr tablet Take 1 tablet (30 mg total) by mouth daily. 30 tablet 1   polyethylene glycol powder (MIRALAX ) 17 GM/SCOOP powder Take 17 g by mouth daily. Dissolve 1  capful (17g) in 4-8 ounces of liquid and take by mouth daily. 238 g 1   Current Facility-Administered Medications on File Prior to Visit  Medication Dose Route Frequency Provider Last Rate Last Admin   0.9 %  sodium chloride  infusion   Intravenous Continuous Pace, Gwendlyn SAUNDERS, NP       "

## 2024-10-25 LAB — AEROBIC CULTURE

## 2024-10-29 ENCOUNTER — Encounter (INDEPENDENT_AMBULATORY_CARE_PROVIDER_SITE_OTHER): Payer: Self-pay | Admitting: Nurse Practitioner

## 2024-10-29 ENCOUNTER — Ambulatory Visit (INDEPENDENT_AMBULATORY_CARE_PROVIDER_SITE_OTHER): Admitting: Nurse Practitioner

## 2024-10-29 VITALS — BP 124/87 | HR 90 | Resp 18

## 2024-10-29 DIAGNOSIS — Z89512 Acquired absence of left leg below knee: Secondary | ICD-10-CM

## 2024-10-29 DIAGNOSIS — T8149XA Infection following a procedure, other surgical site, initial encounter: Secondary | ICD-10-CM

## 2024-10-29 MED ORDER — SULFAMETHOXAZOLE-TRIMETHOPRIM 800-160 MG PO TABS: 1.0000 | ORAL_TABLET | Freq: Two times a day (BID) | ORAL | 0 refills | Status: DC

## 2024-11-01 ENCOUNTER — Encounter (INDEPENDENT_AMBULATORY_CARE_PROVIDER_SITE_OTHER): Payer: Self-pay

## 2024-11-01 ENCOUNTER — Other Ambulatory Visit (INDEPENDENT_AMBULATORY_CARE_PROVIDER_SITE_OTHER): Payer: Self-pay | Admitting: Nurse Practitioner

## 2024-11-01 MED ORDER — OXYCODONE HCL 10 MG PO TABS: 10.0000 mg | ORAL_TABLET | Freq: Three times a day (TID) | ORAL | 0 refills | Status: DC | PRN

## 2024-11-05 ENCOUNTER — Encounter (INDEPENDENT_AMBULATORY_CARE_PROVIDER_SITE_OTHER): Payer: Self-pay | Admitting: Nurse Practitioner

## 2024-11-05 ENCOUNTER — Ambulatory Visit (INDEPENDENT_AMBULATORY_CARE_PROVIDER_SITE_OTHER): Admitting: Nurse Practitioner

## 2024-11-05 ENCOUNTER — Other Ambulatory Visit (INDEPENDENT_AMBULATORY_CARE_PROVIDER_SITE_OTHER): Payer: Self-pay | Admitting: Nurse Practitioner

## 2024-11-05 VITALS — BP 118/72 | HR 70 | Resp 17 | Ht 72.0 in

## 2024-11-05 DIAGNOSIS — Z89512 Acquired absence of left leg below knee: Secondary | ICD-10-CM

## 2024-11-05 DIAGNOSIS — T8149XA Infection following a procedure, other surgical site, initial encounter: Secondary | ICD-10-CM

## 2024-11-07 ENCOUNTER — Encounter (INDEPENDENT_AMBULATORY_CARE_PROVIDER_SITE_OTHER): Payer: Self-pay | Admitting: Nurse Practitioner

## 2024-11-07 NOTE — Progress Notes (Signed)
 "  Subjective:    Patient ID: Harold [name], male    DOB: 06-Jul-1981, 44 y.o.   MRN: [id number] Chief Complaint  Patient presents with   Follow-up    1 week staple removal    HPI  Discussed the use of AI scribe software for clinical note transcription with the patient, who gave verbal consent to proceed.  History of Present Illness Harold [name] is a 44 year old male with recent left below-knee amputation who presents for follow-up of surgical wound infection and delayed healing at the amputation site.  He is recovering from a left below-knee amputation and has experienced delayed wound healing at the surgical site, with persistent irritation and a localized area of suboptimal healing. He describes one area as 'just not healing the best' and notes ongoing irritation. The wound previously appeared moist but now demonstrates improved scabbing and decreased exudate. He performs wound care with Betadine at each dressing change and is familiar with wound care routines.  A wound culture from the amputation site grew Staphylococcus aureus. He is currently taking Bactrim  for the wound infection. He notes improvement in the wound's appearance compared to last week, though a few areas remain slow to heal. He is nearing completion of his antibiotic course, with 7 to 8 doses remaining, and will receive a few additional days of antibiotics.  He continues to experience acute postprocedural pain at the amputation site, particularly during staple removal and wound care. He describes staple removal as very painful, requiring breaks during the procedure, and expresses anxiety regarding ongoing wound care. He is taking pain medication as prescribed, using two tablets per day, and is not out of medication. He is currently taking two tablets of pain medication daily for ongoing pain.    Results Labs Wound culture: Staphylococcus aureus isolated, susceptible to trimethoprim -sulfamethoxazole   (Bactrim )  Staple removal from left below-knee amputation surgical wound Approximately half of staples removed from left below-knee amputation incision. Area with prior infection improved, no wet appearance, scabbing present. One area with delayed healing and mild irritation. Skin still somewhat raw but improved compared to prior visit.  Wound dressing change with application of Adacel and Steri-Strips to left below-knee amputation site Surgical wound cleansed with Betadine. Adacel strip applied to area of delayed healing. Steri-Strips applied across incision. Gauze and ACE wrap used for dressing. Instructions given for dressing changes every other day.   Review of Systems  Skin:  Positive for wound.  Neurological:  Positive for weakness.  All other systems reviewed and are negative.      Objective:   Physical Exam Vitals reviewed.  HENT:     Head: Normocephalic.  Cardiovascular:     Rate and Rhythm: Normal rate.     Pulses: Normal pulses.  Pulmonary:     Effort: Pulmonary effort is normal.  Musculoskeletal:     Right Lower Extremity: Right leg is amputated below knee.  Skin:    General: Skin is warm and dry.  Neurological:     Mental Status: He is alert and oriented to person, place, and time.  Psychiatric:        Mood and Affect: Mood normal.        Behavior: Behavior normal.        Thought Content: Thought content normal.        Judgment: Judgment normal.     Physical Exam SKIN: Skin healing with no wet appearance, slightly irritated with some areas of concern.  BP 124/87 (BP Location:  Right Arm)   Pulse 90   Resp 18   Past Medical History:  Diagnosis Date   Anxiety    Arthritis    Back pain    Peripheral vascular disease    Shingles     Social History   Socioeconomic History   Marital status: Married    Spouse name: Not on file   Number of children: Not on file   Years of education: Not on file   Highest education level: Not on file  Occupational  History   Not on file  Tobacco Use   Smoking status: Former    Current packs/day: 0.00    Average packs/day: 0.5 packs/day for 16.0 years (8.0 ttl pk-yrs)    Types: Cigarettes    Quit date: 2018    Years since quitting: 8.0   Smokeless tobacco: Never  Vaping Use   Vaping status: Never Used  Substance and Sexual Activity   Alcohol use: No    Alcohol/week: 0.0 standard drinks of alcohol    Comment: rare   Drug use: No   Sexual activity: Not on file  Other Topics Concern   Not on file  Social History Narrative   Not on file   Social Drivers of Health   Tobacco Use: Medium Risk (11/07/2024)   Patient History    Smoking Tobacco Use: Former    Smokeless Tobacco Use: Never    Passive Exposure: Not on file  Financial Resource Strain: Low Risk (03/18/2023)   Received from [facility]   Overall Financial Resource Strain (CARDIA)    Difficulty of Paying Living Expenses: Not hard at all  Food Insecurity: Unknown (10/05/2024)   Received from [facility]   Epic    Within the past 12 months, you worried that your food would run out before you got money to buy more: Patient declined to answer    Within the past 12 months, the food you bought just didn't last and you didn't have money to get more. : Patient declined to answer  Transportation Needs: No Transportation Needs (10/05/2024)   Received from [facility]    In the past 12 months, has lack of reliable transportation kept you from medical appointments, meetings, work or from getting things needed for daily living? : No  Physical Activity: Not on file  Stress: Not on file  Social Connections: Unknown (03/04/2022)   Received from [facility]   Social Network    Social Network: Not on file  Intimate Partner Violence: Not At Risk (09/18/2024)   Epic    Fear of Current or Ex-Partner: No    Emotionally Abused: No    Physically Abused: No    Sexually Abused: No  Depression (PHQ2-9): Not on file  Alcohol  Screen: Not on file  Housing: High Risk (10/05/2024)   Received from [facility]   Epic    What is your living situation today?: I do not have a steady place to live (temporarily staying with others, in a hotel, in a shelter, ...    Think about the place you live. Do you have problems with any of the following? Choose all that apply:: None/None on this list  Utilities: Low Risk (10/05/2024)   Received from [facility]   Utilities    In the past 12 months has the electric, gas, oil, or water company threatened to shut off services in your home? : No  Health Literacy: Not on file    Past Surgical  History:  Procedure Laterality Date   AMPUTATION Left 09/21/2024   Procedure: AMPUTATION BELOW KNEE;  Surgeon: Jama Cordella MATSU, MD;  Location: [facility];  Service: Vascular;  Laterality: Left;   BYPASS GRAFT POPLITEAL TO POPLITEAL Left 09/20/2015   Procedure: BYPASS GRAFT POPLITEAL TO PERONEAL;  Surgeon: Cordella MATSU Jama, MD;  Location: [facility];  Service: Vascular;  Laterality: Left;   EMBOLECTOMY Left 09/20/2015   Procedure: EMBOLECTOMY;  Surgeon: Cordella MATSU Jama, MD;  Location: [facility];  Service: Vascular;  Laterality: Left;   LOWER EXTREMITY ANGIOGRAPHY Left 07/22/2017   Procedure: Lower Extremity Angiography;  Surgeon: Jama Cordella MATSU, MD;  Location: [facility];  Service: Cardiovascular;  Laterality: Left;   LOWER EXTREMITY ANGIOGRAPHY Left 09/10/2024   Procedure: Lower Extremity Angiography;  Surgeon: Marea Selinda RAMAN, MD;  Location: [facility];  Service: Cardiovascular;  Laterality: Left;   LOWER EXTREMITY INTERVENTION  07/22/2017   Procedure: LOWER EXTREMITY INTERVENTION;  Surgeon: Jama Cordella MATSU, MD;  Location: [facility];  Service: Cardiovascular;;   LUMBAR LAMINECTOMY/DECOMPRESSION MICRODISCECTOMY Right 12/29/2012   Procedure: SPINE: LAMINOTOMY DECOMPRESSION NERVE ROOT EXCISION HERNIATED DISK 1 SPACE LUMBAR;  Surgeon: Lacinda Ozell Mans, MD;   Location[facility];  Service: Orthopedics;  Laterality: Right;  RIGHT L5-S1 DISC EXCISION   ORIF ANKLE FRACTURE Left 09/20/2015   Procedure: OPEN REDUCTION INTERNAL FIXATION (ORIF) ANKLE FRACTURE;  Surgeon: Ozell Flake, MD;  Location: [facility];  Service: Orthopedics;  Laterality: Left;   PERIPHERAL VASCULAR CATHETERIZATION Left 03/21/2015   Procedure: Lower Extremity Angiography;  Surgeon: Cordella MATSU Jama, MD;  Location: [facility];  Service: Cardiovascular;  Laterality: Left;   PERIPHERAL VASCULAR CATHETERIZATION  03/21/2015   Procedure: Lower Extremity Intervention;  Surgeon: Cordella MATSU Jama, MD;  Location: [facility];  Service: Cardiovascular;;   PERIPHERAL VASCULAR CATHETERIZATION Left 09/05/2015   Procedure: Lower Extremity Angiography;  Surgeon: Cordella MATSU Jama, MD;  Location: [facility];  Service: Cardiovascular;  Laterality: Left;   PERIPHERAL VASCULAR CATHETERIZATION  09/05/2015   Procedure: Lower Extremity Intervention;  Surgeon: Cordella MATSU Jama, MD;  Location: [facility];  Service: Cardiovascular;;   PERIPHERAL VASCULAR CATHETERIZATION Left 08/27/2016   Procedure: Lower Extremity Angiography;  Surgeon: Cordella MATSU Jama, MD;  Location: [facility];  Service: Cardiovascular;  Laterality: Left;   TOOTH EXTRACTION     WISDOM TOOTH EXTRACTION      Family History  Problem Relation Age of Onset   Cancer Mother    Diabetes Mother    Hypertension Mother    Hyperlipidemia Mother    Heart attack Father    Cancer Father     Allergies[1]     Latest Ref Rng & Units 09/25/2024    8:19 AM 09/23/2024    4:48 AM 09/22/2024    8:13 AM  CBC  WBC 4.0 - 10.5 K/uL 13.2  13.0  15.8   Hemoglobin 13.0 - 17.0 g/dL 86.8  86.7  86.2   Hematocrit 39.0 - 52.0 % 38.3  40.5  39.7   Platelets 150 - 400 K/uL 287  255  255       CMP     Component Value Date/Time   NA 136 09/25/2024 0819   K 3.5 09/25/2024 0819   CL 99 09/25/2024 0819   CO2 26  09/25/2024 0819   GLUCOSE 106 (H) 09/25/2024 0819   BUN 20 09/25/2024 0819   CREATININE 0.63 09/25/2024 0819   CREATININE  1.03 09/18/2015 1601   CALCIUM  9.2 09/25/2024 0819   PROT 7.5 09/17/2024 2257   ALBUMIN 4.2 09/17/2024 2257   AST 27 09/17/2024 2257   ALT 39 09/17/2024 2257   ALKPHOS 173 (H) 09/17/2024 2257   BILITOT 0.4 09/17/2024 2257   GFRNONAA >60 09/25/2024 0819     VAS US  ABI WITH/WO TBI Result Date: 09/13/2024 LOWER EXTREMITY ARTERIAL DUPLEX STUDY Patient Name:  GEROGE GILLIAM [facility]  Date of Exam:   09/09/2024 Medical Rec #: [id number]        Accession #:    [id number] Date of Birth: 1980-10-30        Patient Gender: M Patient Age:   43 years Exam Location:  [facility] Procedure:      VAS US  LOWER EXTREMITY ARTERIAL DUPLEX Referring Phys: ORVIN DARING --------------------------------------------------------------------------------  Indications: Peripheral artery disease.  Current ABI: Not Obtained Comparison Study: 02/05/2023 Performing Technologist: Leafy Gibes RVS  Examination Guidelines: A complete evaluation includes B-mode imaging, spectral Doppler, color Doppler, and power Doppler as needed of all accessible portions of each vessel. Bilateral testing is considered an integral part of a complete examination. Limited examinations for reoccurring indications may be performed as noted.   +-----------+--------+-----+--------+---------+--------+ LEFT       PSV cm/sRatioStenosisWaveform Comments +-----------+--------+-----+--------+---------+--------+ CFA Distal 67                   triphasic         +-----------+--------+-----+--------+---------+--------+ DFA        41                   triphasic         +-----------+--------+-----+--------+---------+--------+ SFA Prox   53                   triphasic         +-----------+--------+-----+--------+---------+--------+ SFA Mid    42                   biphasic           +-----------+--------+-----+--------+---------+--------+ SFA Distal 0                    Absent            +-----------+--------+-----+--------+---------+--------+ POP Distal 0                    Absent            +-----------+--------+-----+--------+---------+--------+ ATA Distal 0                    Absent            +-----------+--------+-----+--------+---------+--------+ PTA Distal 0                    Absent            +-----------+--------+-----+--------+---------+--------+ PERO Distal0                    Absent            +-----------+--------+-----+--------+---------+--------+  Summary: Left: Imaging and Waveforms obtained throughout in the Left Lower Extremity. No flow seen in the Left SFA distal segment to the level of the Ankle.  See table(s) above for measurements and observations. Electronically signed by Cordella Shawl MD on 09/13/2024 at 10:06:50 AM.    Final        Assessment & Plan:   1. Surgical wound infection Surgical  wound infection at left below-knee amputation site Surgical wound infection due to Staphylococcus aureus showing improvement with current Bactrim  therapy, though one area remains slow to heal. - Removed approximately half of the staples to facilitate healing. - Applied Adacel strip to the area of delayed healing. - Instructed him to clean the wound with Betadine at each dressing change. - Provided Adacel strips for home use with instructions to apply every other day unless saturated or malodorous. - Provided SteriStrips for areas where staples were removed and for additional support. - Extended Bactrim  prescription for several more days, sent to [facility]. - Scheduled follow-up in one week for further staple removal and wound assessment. - Discussed repeat wound culture if the wound fails to improve or worsens.  2. 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 - Continued wound care and monitoring of the residual limb for healing and signs of infection. - Scheduled follow-up in one week for further evaluation and staple removal.    Acute postprocedural pain at amputation site Acute postprocedural pain managed with oxycodone  10 mg twice daily. - Reviewed current pain management regimen; he is taking oxycodone  10 mg twice daily. - Advised continuation of current pain regimen without taper at this time. - Instructed to contact the office via MyChart or phone if additional pain medication is needed or if pain worsens.   Medications Ordered Prior to Encounter[2]  There are no Patient Instructions on file for this visit. Return in about 1 week (around 11/05/2024) for staple removal .   Orvin FORBES Daring, NP      [1] No Known Allergies [2]  Current Outpatient Medications on File Prior to Visit  Medication Sig Dispense Refill   aspirin  EC 81 MG tablet Take 81 mg by mouth at bedtime. Swallow whole.     LORazepam  (ATIVAN ) 1 MG tablet Take 1 tablet (1 mg total) by mouth every 6 (six) hours as needed for anxiety or sleep (muscle spasm). 30 tablet 0   naphazoline-pheniramine (ALLERGY EYE) 0.025-0.3 % ophthalmic solution Place 1-2 drops into both eyes 4 (four) times daily as needed for eye irritation.     NIFEdipine  (ADALAT  CC) 30 MG 24 hr tablet Take 1 tablet (30 mg total) by mouth daily. 30 tablet 1   polyethylene glycol powder (MIRALAX ) 17 GM/SCOOP powder Take 17 g by mouth daily. Dissolve 1 capful (17g) in 4-8 ounces of liquid and take by mouth daily. 238 g 1   Current Facility-Administered Medications on File Prior to Visit  Medication Dose Route Frequency Provider Last Rate Last Admin   0.9 %  sodium chloride  infusion   Intravenous Continuous Pace, Gwendlyn SAUNDERS, NP       "

## 2024-11-09 ENCOUNTER — Encounter (INDEPENDENT_AMBULATORY_CARE_PROVIDER_SITE_OTHER): Payer: Self-pay

## 2024-11-09 LAB — AEROBIC CULTURE

## 2024-11-10 ENCOUNTER — Other Ambulatory Visit (INDEPENDENT_AMBULATORY_CARE_PROVIDER_SITE_OTHER): Payer: Self-pay

## 2024-11-10 ENCOUNTER — Other Ambulatory Visit (INDEPENDENT_AMBULATORY_CARE_PROVIDER_SITE_OTHER): Payer: Self-pay | Admitting: Nurse Practitioner

## 2024-11-10 MED ORDER — DOXYCYCLINE HYCLATE 100 MG PO CAPS: 100.0000 mg | ORAL_CAPSULE | Freq: Two times a day (BID) | ORAL | 0 refills | Status: AC

## 2024-11-10 MED ORDER — OXYCODONE HCL 10 MG PO TABS: 10.0000 mg | ORAL_TABLET | Freq: Three times a day (TID) | ORAL | 0 refills | Status: DC | PRN

## 2024-11-10 NOTE — Telephone Encounter (Signed)
 Rollo with [facility] left a message requesting refill for Oxycodone  for the patient. She was not able to send request through the system. Please Advise

## 2024-11-12 ENCOUNTER — Ambulatory Visit (INDEPENDENT_AMBULATORY_CARE_PROVIDER_SITE_OTHER): Admitting: Nurse Practitioner

## 2024-11-12 ENCOUNTER — Encounter (INDEPENDENT_AMBULATORY_CARE_PROVIDER_SITE_OTHER): Payer: Self-pay | Admitting: Nurse Practitioner

## 2024-11-12 VITALS — BP 123/84 | HR 97 | Resp 18

## 2024-11-12 DIAGNOSIS — Z89512 Acquired absence of left leg below knee: Secondary | ICD-10-CM

## 2024-11-12 DIAGNOSIS — T8149XA Infection following a procedure, other surgical site, initial encounter: Secondary | ICD-10-CM

## 2024-11-13 ENCOUNTER — Encounter (INDEPENDENT_AMBULATORY_CARE_PROVIDER_SITE_OTHER): Payer: Self-pay | Admitting: Nurse Practitioner

## 2024-11-13 NOTE — Progress Notes (Signed)
 "  Subjective:    Patient ID: Harold [name], male    DOB: Feb 22, 1981, 44 y.o.   MRN: [id number] Chief Complaint  Patient presents with   Follow-up    Return in about 1 week (around 11/05/2024) for staple removal    HPI  Discussed the use of AI scribe software for clinical note transcription with the patient, who gave verbal consent to proceed.  History of Present Illness Harold [name] is a 44 year old male with peripheral vascular disease and left below-knee amputation who presents for follow-up of infection and wound healing at the amputation stump.  He reports that the wound at the amputation stump is largely unchanged since the last visit. He notes the development of a new area on the lateral aspect of the stump, which he attributes to bandage friction. This area is non-tender but demonstrates serous drainage. The wound overall is scabbing, with persistent areas that remain open, raw, and moist.  He experienced significant pain and burning during staple removal and prefers to avoid lidocaine  injection at this time, though he is amenable to its use in the future if needed.  He is currently completing a course of antibiotics, with today marking the final dose. He continues wound care with Betadine cleansing, zero form dressing, and Aquacel strips, which he finds to be drier than previous dressings. He expresses concern regarding the slow healing process. He remains attentive to wound changes.    Results Suture/Staple Removal Staples partially removed from left below-knee amputation stump. Some staples left in place due to pain and difficulty; plan to remove remaining staples at next visit. Area scabbing over; one area draining, superficial. No lidocaine  administered.  Wound Culture Swab obtained from persistently wet area of left below-knee amputation stump for bacterial culture.   Review of Systems     Objective:   Physical Exam  Physical Exam EXTREMITIES: Limb warm. SKIN:  Area scabbing over, still moist.  BP 118/72   Pulse 70   Resp 17   Ht 6' (1.829 m)   BMI 27.12 kg/m   Past Medical History:  Diagnosis Date   Anxiety    Arthritis    Back pain    Peripheral vascular disease    Shingles     Social History   Socioeconomic History   Marital status: Married    Spouse name: Not on file   Number of children: Not on file   Years of education: Not on file   Highest education level: Not on file  Occupational History   Not on file  Tobacco Use   Smoking status: Former    Current packs/day: 0.00    Average packs/day: 0.5 packs/day for 16.0 years (8.0 ttl pk-yrs)    Types: Cigarettes    Quit date: 2018    Years since quitting: 8.0   Smokeless tobacco: Never  Vaping Use   Vaping status: Never Used  Substance and Sexual Activity   Alcohol use: No    Alcohol/week: 0.0 standard drinks of alcohol    Comment: rare   Drug use: No   Sexual activity: Not on file  Other Topics Concern   Not on file  Social History Narrative   Not on file   Social Drivers of Health   Tobacco Use: Medium Risk (11/13/2024)   Patient History    Smoking Tobacco Use: Former    Smokeless Tobacco Use: Never    Passive Exposure: Not on file  Financial Resource Strain: Low Risk (03/18/2023)  Received from [facility]   Overall Financial Resource Strain (CARDIA)    Difficulty of Paying Living Expenses: Not hard at all  Food Insecurity: Unknown (10/05/2024)   Received from [facility]   Epic    Within the past 12 months, you worried that your food would run out before you got money to buy more: Patient declined to answer    Within the past 12 months, the food you bought just didn't last and you didn't have money to get more. : Patient declined to answer  Transportation Needs: No Transportation Needs (10/05/2024)   Received from [facility]    In the past 12 months, has lack of reliable transportation kept you from medical appointments,  meetings, work or from getting things needed for daily living? : No  Physical Activity: Not on file  Stress: Not on file  Social Connections: Unknown (03/04/2022)   Received from [facility]   Social Network    Social Network: Not on file  Intimate Partner Violence: Not At Risk (09/18/2024)   Epic    Fear of Current or Ex-Partner: No    Emotionally Abused: No    Physically Abused: No    Sexually Abused: No  Depression (PHQ2-9): Not on file  Alcohol Screen: Not on file  Housing: High Risk (10/05/2024)   Received from [facility]   Epic    What is your living situation today?: I do not have a steady place to live (temporarily staying with others, in a hotel, in a shelter, ...    Think about the place you live. Do you have problems with any of the following? Choose all that apply:: None/None on this list  Utilities: Low Risk (10/05/2024)   Received from [facility]   Utilities    In the past 12 months has the electric, gas, oil, or water company threatened to shut off services in your home? : No  Health Literacy: Not on file    Past Surgical History:  Procedure Laterality Date   AMPUTATION Left 09/21/2024   Procedure: AMPUTATION BELOW KNEE;  Surgeon: Jama Cordella MATSU, MD;  Location: [facility] OR[facility];  Service: Vascular;  Laterality: Left;   BYPASS GRAFT POPLITEAL TO POPLITEAL Left 09/20/2015   Procedure: BYPASS GRAFT POPLITEAL TO PERONEAL;  Surgeon: Cordella MATSU Jama, MD;  Location: [facility];  Service: Vascular;  Laterality: Left;   EMBOLECTOMY Left 09/20/2015   Procedure: EMBOLECTOMY;  Surgeon: Cordella MATSU Jama, MD;  Location: [facility];  Service: Vascular;  Laterality: Left;   LOWER EXTREMITY ANGIOGRAPHY Left 07/22/2017   Procedure: Lower Extremity Angiography;  Surgeon: Jama Cordella MATSU, MD;  Location: [facility];  Service: Cardiovascular;  Laterality: Left;   LOWER EXTREMITY ANGIOGRAPHY Left 09/10/2024   Procedure: Lower Extremity Angiography;  Surgeon: Marea Selinda RAMAN,  MD;  Location: [facility];  Service: Cardiovascular;  Laterality: Left;   LOWER EXTREMITY INTERVENTION  07/22/2017   Procedure: LOWER EXTREMITY INTERVENTION;  Surgeon: Jama Cordella MATSU, MD;  Location: [facility];  Service: Cardiovascular;;   LUMBAR LAMINECTOMY/DECOMPRESSION MICRODISCECTOMY Right 12/29/2012   Procedure: SPINE: LAMINOTOMY DECOMPRESSION NERVE ROOT EXCISION HERNIATED DISK 1 SPACE LUMBAR;  Surgeon: Lacinda Ozell Mans, MD;  Location[facility];  Service: Orthopedics;  Laterality: Right;  RIGHT L5-S1 DISC EXCISION   ORIF ANKLE FRACTURE Left 09/20/2015   Procedure: OPEN REDUCTION INTERNAL FIXATION (ORIF) ANKLE FRACTURE;  Surgeon: Ozell Flake, MD;  Location: [facility];  Service: Orthopedics;  Laterality: Left;  PERIPHERAL VASCULAR CATHETERIZATION Left 03/21/2015   Procedure: Lower Extremity Angiography;  Surgeon: Cordella KANDICE Shawl, MD;  Location: [facility];  Service: Cardiovascular;  Laterality: Left;   PERIPHERAL VASCULAR CATHETERIZATION  03/21/2015   Procedure: Lower Extremity Intervention;  Surgeon: Cordella KANDICE Shawl, MD;  Location: [facility];  Service: Cardiovascular;;   PERIPHERAL VASCULAR CATHETERIZATION Left 09/05/2015   Procedure: Lower Extremity Angiography;  Surgeon: Cordella KANDICE Shawl, MD;  Location: [facility];  Service: Cardiovascular;  Laterality: Left;   PERIPHERAL VASCULAR CATHETERIZATION  09/05/2015   Procedure: Lower Extremity Intervention;  Surgeon: Cordella KANDICE Shawl, MD;  Location: [facility];  Service: Cardiovascular;;   PERIPHERAL VASCULAR CATHETERIZATION Left 08/27/2016   Procedure: Lower Extremity Angiography;  Surgeon: Cordella KANDICE Shawl, MD;  Location: [facility];  Service: Cardiovascular;  Laterality: Left;   TOOTH EXTRACTION     WISDOM TOOTH EXTRACTION      Family History  Problem Relation Age of Onset   Cancer Mother    Diabetes Mother    Hypertension Mother    Hyperlipidemia Mother     Heart attack Father    Cancer Father     Allergies[1]     Latest Ref Rng & Units 09/25/2024    8:19 AM 09/23/2024    4:48 AM 09/22/2024    8:13 AM  CBC  WBC 4.0 - 10.5 K/uL 13.2  13.0  15.8   Hemoglobin 13.0 - 17.0 g/dL 86.8  86.7  86.2   Hematocrit 39.0 - 52.0 % 38.3  40.5  39.7   Platelets 150 - 400 K/uL 287  255  255       CMP     Component Value Date/Time   NA 136 09/25/2024 0819   K 3.5 09/25/2024 0819   CL 99 09/25/2024 0819   CO2 26 09/25/2024 0819   GLUCOSE 106 (H) 09/25/2024 0819   BUN 20 09/25/2024 0819   CREATININE 0.63 09/25/2024 0819   CREATININE 1.03 09/18/2015 1601   CALCIUM  9.2 09/25/2024 0819   PROT 7.5 09/17/2024 2257   ALBUMIN 4.2 09/17/2024 2257   AST 27 09/17/2024 2257   ALT 39 09/17/2024 2257   ALKPHOS 173 (H) 09/17/2024 2257   BILITOT 0.4 09/17/2024 2257   GFRNONAA >60 09/25/2024 0819     No results found.     Assessment & Plan:     1. Surgical wound infection Surgical wound infection of left below-knee amputation stump Persistent infection with drainage and scabbing, complicated by microvascular disease. New lesion likely from bandage friction. Multiple bacterial organisms possible. - Obtained wound culture from wet area. - Allowed completion of current antibiotic course before adjusting therapy, pending culture results. - Instructed wound cleansing with Betadine and zero form dressing. - Continued Aquacel strips for drainage, adjusting moisture balance as needed. - Applied SteriStrips, avoiding area of concern. - Deferred removal of remaining staples in painful area until next visit. - Planned reassessment of wound and staple removal at next visit.  2. Hx of left BKA (HCC) (Primary) Acquired absence of left lower leg below knee Status post amputation with slow healing due to vascular insufficiency. Pain with staple removal and skin breakdown from bandage friction. Prolonged healing expected. - Discussed pain management for staple  removal, including lidocaine  injection, with plan to use lidocaine  if needed. - Provided reassurance regarding slow healing process.     Medications Ordered Prior to Encounter[2]  There are no Patient Instructions on file for this visit.  No follow-ups on file.   Takako Minckler E Rivers Gassmann, NP      [1] No Known Allergies [2]  Current Outpatient Medications on File Prior to Visit  Medication Sig Dispense Refill   aspirin  EC 81 MG tablet Take 81 mg by mouth at bedtime. Swallow whole.     LORazepam  (ATIVAN ) 1 MG tablet Take 1 tablet (1 mg total) by mouth every 6 (six) hours as needed for anxiety or sleep (muscle spasm). 30 tablet 0   naphazoline-pheniramine (ALLERGY EYE) 0.025-0.3 % ophthalmic solution Place 1-2 drops into both eyes 4 (four) times daily as needed for eye irritation.     NIFEdipine  (ADALAT  CC) 30 MG 24 hr tablet Take 1 tablet (30 mg total) by mouth daily. 30 tablet 1   polyethylene glycol powder (MIRALAX ) 17 GM/SCOOP powder Take 17 g by mouth daily. Dissolve 1 capful (17g) in 4-8 ounces of liquid and take by mouth daily. 238 g 1   Current Facility-Administered Medications on File Prior to Visit  Medication Dose Route Frequency Provider Last Rate Last Admin   0.9 %  sodium chloride  infusion   Intravenous Continuous Pace, Gwendlyn SAUNDERS, NP       "

## 2024-11-15 ENCOUNTER — Encounter (INDEPENDENT_AMBULATORY_CARE_PROVIDER_SITE_OTHER): Payer: Self-pay | Admitting: Nurse Practitioner

## 2024-11-15 NOTE — Progress Notes (Signed)
 "  Subjective:    Patient ID: Harold [name], male    DOB: 10-05-1981, 44 y.o.   MRN: [id number] Chief Complaint  Patient presents with   Follow-up    1 week staple removal    HPI  Discussed the use of AI scribe software for clinical note transcription with the patient, who gave verbal consent to proceed.  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  He is currently being treated for a wound infection at the left below-knee amputation site following recent vascular bypass. The wound was previously persistently moist despite more than ten days of oral antibiotics, and a recent culture grew the same organism as prior. Antibiotic regimen was changed several days ago, and he now notes the wound is beginning to dry with scabbing and decreased exudate. He remains concerned about perfusion to the limb due to underlying Buerger's disease, but denies coldness, dusky discoloration, or other signs of ischemia.  He continues to experience significant pain at the wound site, managed with narcotic analgesics three times daily. Attempts to discontinue pain medication resulted in inability to tolerate oral intake, necessitating resumption of his usual dose. He continues to experience discomfort and difficulty with knee extension, particularly with prolonged wheelchair use.  Physical therapy and in-home nursing visits have lapsed for approximately two weeks. He previously received physical therapy but is unsure of the provider. He is able to transfer independently and is primarily wheelchair-bound at home. His wheelchair lacks footrests, resulting in the leg often hanging down, which is suboptimal for maintaining extension.    Results Labs Wound culture: Growth of same bacterial species as previous culture  Suture removal, left below-knee amputation stump Lidocaine   infiltration for local anesthesia. Removal of approximately five sutures from left below-knee amputation stump, with one or two sutures left in place due to adherence to scab. Immediate bleeding present, wound bed with scabbing and evidence of drying, no excessive drainage. Area covered with Xeroform dressing and Santyl ointment applied for debridement. No signs of tissue necrosis or significant infection. Skin warm, no dusky discoloration, no boggy areas.   Review of Systems  Skin:  Positive for wound.  All other systems reviewed and are negative.      Objective:   Physical Exam Vitals reviewed.  HENT:     Head: Normocephalic.  Cardiovascular:     Rate and Rhythm: Normal rate.  Pulmonary:     Effort: Pulmonary effort is normal.  Musculoskeletal:     Left Lower Extremity: Left leg is amputated below knee.  Skin:    General: Skin is warm and dry.  Neurological:     Mental Status: He is alert and oriented to person, place, and time.     Motor: Weakness present.  Psychiatric:        Mood and Affect: Mood normal.        Behavior: Behavior normal.        Thought Content: Thought content normal.        Judgment: Judgment normal.     Physical Exam SKIN: Wound appears wet, drying out, and incision scabbing. Wound site warm with no dusky blue discoloration or boggy areas.  BP 123/84 (BP Location: Left Arm)   Pulse 97   Resp 18   Past Medical History:  Diagnosis Date   Anxiety    Arthritis    Back pain  Peripheral vascular disease    Shingles     Social History   Socioeconomic History   Marital status: Married    Spouse name: Not on file   Number of children: Not on file   Years of education: Not on file   Highest education level: Not on file  Occupational History   Not on file  Tobacco Use   Smoking status: Former    Current packs/day: 0.00    Average packs/day: 0.5 packs/day for 16.0 years (8.0 ttl pk-yrs)    Types: Cigarettes    Quit date: 2018    Years  since quitting: 8.0   Smokeless tobacco: Never  Vaping Use   Vaping status: Never Used  Substance and Sexual Activity   Alcohol use: No    Alcohol/week: 0.0 standard drinks of alcohol    Comment: rare   Drug use: No   Sexual activity: Not on file  Other Topics Concern   Not on file  Social History Narrative   Not on file   Social Drivers of Health   Tobacco Use: Medium Risk (11/15/2024)   Patient History    Smoking Tobacco Use: Former    Smokeless Tobacco Use: Never    Passive Exposure: Not on file  Financial Resource Strain: Low Risk (03/18/2023)   Received from [facility]   Overall Financial Resource Strain (CARDIA)    Difficulty of Paying Living Expenses: Not hard at all  Food Insecurity: Unknown (10/05/2024)   Received from [facility]   Epic    Within the past 12 months, you worried that your food would run out before you got money to buy more: Patient declined to answer    Within the past 12 months, the food you bought just didn't last and you didn't have money to get more. : Patient declined to answer  Transportation Needs: No Transportation Needs (10/05/2024)   Received from [facility]    In the past 12 months, has lack of reliable transportation kept you from medical appointments, meetings, work or from getting things needed for daily living? : No  Physical Activity: Not on file  Stress: Not on file  Social Connections: Unknown (03/04/2022)   Received from [facility]   Social Network    Social Network: Not on file  Intimate Partner Violence: Not At Risk (09/18/2024)   Epic    Fear of Current or Ex-Partner: No    Emotionally Abused: No    Physically Abused: No    Sexually Abused: No  Depression (PHQ2-9): Not on file  Alcohol Screen: Not on file  Housing: High Risk (10/05/2024)   Received from [facility]   Epic    What is your living situation today?: I do not have a steady place to live (temporarily staying with others, in a  hotel, in a shelter, ...    Think about the place you live. Do you have problems with any of the following? Choose all that apply:: None/None on this list  Utilities: Low Risk (10/05/2024)   Received from [facility]   Utilities    In the past 12 months has the electric, gas, oil, or water company threatened to shut off services in your home? : No  Health Literacy: Not on file    Past Surgical History:  Procedure Laterality Date   AMPUTATION Left 09/21/2024   Procedure: AMPUTATION BELOW KNEE;  Surgeon: Jama Cordella MATSU, MD;  Location: [facility];  Service: Vascular;  Laterality: Left;  BYPASS GRAFT POPLITEAL TO POPLITEAL Left 09/20/2015   Procedure: BYPASS GRAFT POPLITEAL TO PERONEAL;  Surgeon: Cordella KANDICE Shawl, MD;  Location: [facility];  Service: Vascular;  Laterality: Left;   EMBOLECTOMY Left 09/20/2015   Procedure: EMBOLECTOMY;  Surgeon: Cordella KANDICE Shawl, MD;  Location: [facility];  Service: Vascular;  Laterality: Left;   LOWER EXTREMITY ANGIOGRAPHY Left 07/22/2017   Procedure: Lower Extremity Angiography;  Surgeon: Shawl Cordella KANDICE, MD;  Location: [facility];  Service: Cardiovascular;  Laterality: Left;   LOWER EXTREMITY ANGIOGRAPHY Left 09/10/2024   Procedure: Lower Extremity Angiography;  Surgeon: Marea Selinda RAMAN, MD;  Location: [facility];  Service: Cardiovascular;  Laterality: Left;   LOWER EXTREMITY INTERVENTION  07/22/2017   Procedure: LOWER EXTREMITY INTERVENTION;  Surgeon: Shawl Cordella KANDICE, MD;  Location: [facility];  Service: Cardiovascular;;   LUMBAR LAMINECTOMY/DECOMPRESSION MICRODISCECTOMY Right 12/29/2012   Procedure: SPINE: LAMINOTOMY DECOMPRESSION NERVE ROOT EXCISION HERNIATED DISK 1 SPACE LUMBAR;  Surgeon: Lacinda Ozell Mans, MD;  Location[facility] OR;  Service: Orthopedics;  Laterality: Right;  RIGHT L5-S1 DISC EXCISION   ORIF ANKLE FRACTURE Left 09/20/2015   Procedure: OPEN REDUCTION INTERNAL FIXATION (ORIF) ANKLE FRACTURE;  Surgeon: Ozell Flake, MD;  Location: [facility];  Service: Orthopedics;  Laterality: Left;   PERIPHERAL VASCULAR CATHETERIZATION Left 03/21/2015   Procedure: Lower Extremity Angiography;  Surgeon: Cordella KANDICE Shawl, MD;  Location: [facility];  Service: Cardiovascular;  Laterality: Left;   PERIPHERAL VASCULAR CATHETERIZATION  03/21/2015   Procedure: Lower Extremity Intervention;  Surgeon: Cordella KANDICE Shawl, MD;  Location: [facility];  Service: Cardiovascular;;   PERIPHERAL VASCULAR CATHETERIZATION Left 09/05/2015   Procedure: Lower Extremity Angiography;  Surgeon: Cordella KANDICE Shawl, MD;  Location: [facility];  Service: Cardiovascular;  Laterality: Left;   PERIPHERAL VASCULAR CATHETERIZATION  09/05/2015   Procedure: Lower Extremity Intervention;  Surgeon: Cordella KANDICE Shawl, MD;  Location: [facility];  Service: Cardiovascular;;   PERIPHERAL VASCULAR CATHETERIZATION Left 08/27/2016   Procedure: Lower Extremity Angiography;  Surgeon: Cordella KANDICE Shawl, MD;  Location: [facility];  Service: Cardiovascular;  Laterality: Left;   TOOTH EXTRACTION     WISDOM TOOTH EXTRACTION      Family History  Problem Relation Age of Onset   Cancer Mother    Diabetes Mother    Hypertension Mother    Hyperlipidemia Mother    Heart attack Father    Cancer Father     Allergies[1]     Latest Ref Rng & Units 09/25/2024    8:19 AM 09/23/2024    4:48 AM 09/22/2024    8:13 AM  CBC  WBC 4.0 - 10.5 K/uL 13.2  13.0  15.8   Hemoglobin 13.0 - 17.0 g/dL 86.8  86.7  86.2   Hematocrit 39.0 - 52.0 % 38.3  40.5  39.7   Platelets 150 - 400 K/uL 287  255  255       CMP     Component Value Date/Time   NA 136 09/25/2024 0819   K 3.5 09/25/2024 0819   CL 99 09/25/2024 0819   CO2 26 09/25/2024 0819   GLUCOSE 106 (H) 09/25/2024 0819   BUN 20 09/25/2024 0819   CREATININE 0.63 09/25/2024 0819   CREATININE 1.03 09/18/2015 1601   CALCIUM  9.2 09/25/2024 0819   PROT 7.5 09/17/2024 2257    ALBUMIN 4.2 09/17/2024 2257   AST 27 09/17/2024 2257   ALT 39 09/17/2024 2257  ALKPHOS 173 (H) 09/17/2024 2257   BILITOT 0.4 09/17/2024 2257   GFRNONAA >60 09/25/2024 0819     No results found.     Assessment & Plan:   1. Hx of left BKA (HCC) (Primary) Acquired absence of left lower leg below knee Healing phase post-amputation. No current indication for higher-level amputation. Wheelchair-dependent, not using prosthesis. - Reinforced knee extension importance. - Discussed wheelchair footrests and leg elevation. - Encouraged physical therapy participation. - Coordinated with home health and physical therapy.  2. Surgical wound infection Surgical wound infection Wound shows improvement with current antibiotic regimen. No necrosis or discoloration. Approximately 50% healing well. - Changed oral antibiotic regimen. - Applied topical Santyl ointment. - Instructed nursing staff to use Xeroform dressing. - Advised against Neosporin . - Scheduled follow-up in two weeks. - Requested MyChart notification for home health coordination.   Medications Ordered Prior to Encounter[2]  There are no Patient Instructions on file for this visit. Return in about 2 weeks (around 11/26/2024) for wound check fb.   Naman Spychalski E Keir Viernes, NP      [1] No Known Allergies [2]  Current Outpatient Medications on File Prior to Visit  Medication Sig Dispense Refill   aspirin  EC 81 MG tablet Take 81 mg by mouth at bedtime. Swallow whole.     doxycycline  (VIBRAMYCIN ) 100 MG capsule Take 1 capsule (100 mg total) by mouth 2 (two) times daily. 20 capsule 0   LORazepam  (ATIVAN ) 1 MG tablet Take 1 tablet (1 mg total) by mouth every 6 (six) hours as needed for anxiety or sleep (muscle spasm). 30 tablet 0   naphazoline-pheniramine (ALLERGY EYE) 0.025-0.3 % ophthalmic solution Place 1-2 drops into both eyes 4 (four) times daily as needed for eye irritation.     NIFEdipine  (ADALAT  CC) 30 MG 24 hr tablet Take 1 tablet  (30 mg total) by mouth daily. 30 tablet 1   Oxycodone  HCl 10 MG TABS Take 1 tablet (10 mg total) by mouth 3 (three) times daily as needed. 21 tablet 0   polyethylene glycol powder (MIRALAX ) 17 GM/SCOOP powder Take 17 g by mouth daily. Dissolve 1 capful (17g) in 4-8 ounces of liquid and take by mouth daily. 238 g 1   Current Facility-Administered Medications on File Prior to Visit  Medication Dose Route Frequency Provider Last Rate Last Admin   0.9 %  sodium chloride  infusion   Intravenous Continuous Pace, Gwendlyn SAUNDERS, NP       "

## 2024-11-16 ENCOUNTER — Encounter (INDEPENDENT_AMBULATORY_CARE_PROVIDER_SITE_OTHER): Payer: Self-pay

## 2024-11-16 ENCOUNTER — Other Ambulatory Visit (INDEPENDENT_AMBULATORY_CARE_PROVIDER_SITE_OTHER): Payer: Self-pay | Admitting: Nurse Practitioner

## 2024-11-16 MED ORDER — OXYCODONE HCL 10 MG PO TABS: 10.0000 mg | ORAL_TABLET | Freq: Three times a day (TID) | ORAL | 0 refills | Status: DC | PRN

## 2024-11-17 ENCOUNTER — Other Ambulatory Visit (INDEPENDENT_AMBULATORY_CARE_PROVIDER_SITE_OTHER): Payer: Self-pay | Admitting: Nurse Practitioner

## 2024-11-17 MED ORDER — OXYCODONE HCL 10 MG PO TABS: 10.0000 mg | ORAL_TABLET | Freq: Three times a day (TID) | ORAL | 0 refills | Status: DC | PRN

## 2024-11-24 ENCOUNTER — Other Ambulatory Visit (INDEPENDENT_AMBULATORY_CARE_PROVIDER_SITE_OTHER): Payer: Self-pay | Admitting: Nurse Practitioner

## 2024-11-24 MED ORDER — OXYCODONE HCL 10 MG PO TABS: 10.0000 mg | ORAL_TABLET | Freq: Three times a day (TID) | ORAL | 0 refills | Status: AC | PRN

## 2024-11-26 ENCOUNTER — Ambulatory Visit (INDEPENDENT_AMBULATORY_CARE_PROVIDER_SITE_OTHER): Admitting: Nurse Practitioner

## 2024-11-26 ENCOUNTER — Encounter (INDEPENDENT_AMBULATORY_CARE_PROVIDER_SITE_OTHER): Payer: Self-pay | Admitting: Nurse Practitioner

## 2024-12-08 ENCOUNTER — Ambulatory Visit: Admitting: Nurse Practitioner

## 2024-12-08 ENCOUNTER — Ambulatory Visit (INDEPENDENT_AMBULATORY_CARE_PROVIDER_SITE_OTHER): Admitting: Nurse Practitioner

## 2025-09-05 ENCOUNTER — Ambulatory Visit (INDEPENDENT_AMBULATORY_CARE_PROVIDER_SITE_OTHER): Admitting: Vascular Surgery

## 2025-09-05 ENCOUNTER — Encounter (INDEPENDENT_AMBULATORY_CARE_PROVIDER_SITE_OTHER)
